# Patient Record
Sex: Male | Born: 1945 | Race: White | Hispanic: No | Marital: Married | Smoking: Never smoker
Health system: Southern US, Community
[De-identification: ages and names within clinical notes are randomized; demographics above are authoritative.]

## PROBLEM LIST (undated history)

## (undated) DIAGNOSIS — C61 Malignant neoplasm of prostate: Secondary | ICD-10-CM

## (undated) DIAGNOSIS — Z889 Allergy status to unspecified drugs, medicaments and biological substances status: Secondary | ICD-10-CM

## (undated) DIAGNOSIS — R0982 Postnasal drip: Secondary | ICD-10-CM

## (undated) DIAGNOSIS — R7989 Other specified abnormal findings of blood chemistry: Secondary | ICD-10-CM

## (undated) DIAGNOSIS — E785 Hyperlipidemia, unspecified: Secondary | ICD-10-CM

## (undated) DIAGNOSIS — C801 Malignant (primary) neoplasm, unspecified: Secondary | ICD-10-CM

## (undated) DIAGNOSIS — Z87898 Personal history of other specified conditions: Secondary | ICD-10-CM

## (undated) DIAGNOSIS — U071 COVID-19: Secondary | ICD-10-CM

## (undated) DIAGNOSIS — M1990 Unspecified osteoarthritis, unspecified site: Secondary | ICD-10-CM

## (undated) DIAGNOSIS — K219 Gastro-esophageal reflux disease without esophagitis: Secondary | ICD-10-CM

## (undated) DIAGNOSIS — I10 Essential (primary) hypertension: Secondary | ICD-10-CM

## (undated) DIAGNOSIS — I2510 Atherosclerotic heart disease of native coronary artery without angina pectoris: Secondary | ICD-10-CM

## (undated) DIAGNOSIS — I739 Peripheral vascular disease, unspecified: Secondary | ICD-10-CM

## (undated) DIAGNOSIS — M65332 Trigger finger, left middle finger: Secondary | ICD-10-CM

## (undated) DIAGNOSIS — M65341 Trigger finger, right ring finger: Secondary | ICD-10-CM

## (undated) DIAGNOSIS — M65331 Trigger finger, right middle finger: Secondary | ICD-10-CM

## (undated) DIAGNOSIS — N3941 Urge incontinence: Secondary | ICD-10-CM

## (undated) DIAGNOSIS — J189 Pneumonia, unspecified organism: Secondary | ICD-10-CM

## (undated) DIAGNOSIS — Z973 Presence of spectacles and contact lenses: Secondary | ICD-10-CM

## (undated) DIAGNOSIS — Z9189 Other specified personal risk factors, not elsewhere classified: Secondary | ICD-10-CM

## (undated) DIAGNOSIS — Z951 Presence of aortocoronary bypass graft: Secondary | ICD-10-CM

## (undated) HISTORY — PX: MOHS SURGERY: SUR867

## (undated) HISTORY — PX: TONSILLECTOMY: SUR1361

## (undated) HISTORY — DX: Hyperlipidemia, unspecified: E78.5

## (undated) HISTORY — PX: CATARACT EXTRACTION, BILATERAL: SHX1313

## (undated) HISTORY — PX: COLONOSCOPY: SHX174

## (undated) HISTORY — DX: Essential (primary) hypertension: I10

## (undated) HISTORY — PX: OTHER SURGICAL HISTORY: SHX169

## (undated) HISTORY — PX: SURGERY SCROTAL / TESTICULAR: SUR1316

## (undated) HISTORY — DX: Presence of aortocoronary bypass graft: Z95.1

## (undated) HISTORY — DX: Atherosclerotic heart disease of native coronary artery without angina pectoris: I25.10

## (undated) HISTORY — DX: Other specified abnormal findings of blood chemistry: R79.89

---

## 1998-05-04 ENCOUNTER — Other Ambulatory Visit: Admission: RE | Admit: 1998-05-04 | Discharge: 1998-05-04 | Payer: Self-pay | Admitting: Urology

## 1998-11-02 ENCOUNTER — Encounter: Admission: RE | Admit: 1998-11-02 | Discharge: 1998-11-02 | Payer: Self-pay | Admitting: Urology

## 1998-11-02 ENCOUNTER — Encounter: Payer: Self-pay | Admitting: Urology

## 1999-05-01 ENCOUNTER — Encounter: Payer: Self-pay | Admitting: Urology

## 1999-05-01 ENCOUNTER — Encounter: Admission: RE | Admit: 1999-05-01 | Discharge: 1999-05-01 | Payer: Self-pay | Admitting: Urology

## 2000-01-22 ENCOUNTER — Encounter: Payer: Self-pay | Admitting: Urology

## 2000-01-22 ENCOUNTER — Encounter: Admission: RE | Admit: 2000-01-22 | Discharge: 2000-01-22 | Payer: Self-pay | Admitting: Urology

## 2001-03-10 ENCOUNTER — Encounter: Admission: RE | Admit: 2001-03-10 | Discharge: 2001-03-10 | Payer: Self-pay | Admitting: Urology

## 2001-03-10 ENCOUNTER — Encounter: Payer: Self-pay | Admitting: Urology

## 2002-01-15 ENCOUNTER — Encounter: Admission: RE | Admit: 2002-01-15 | Discharge: 2002-01-15 | Payer: Self-pay | Admitting: Urology

## 2002-01-15 ENCOUNTER — Encounter: Payer: Self-pay | Admitting: Urology

## 2002-04-20 ENCOUNTER — Encounter: Payer: Self-pay | Admitting: Orthopedic Surgery

## 2002-04-20 ENCOUNTER — Ambulatory Visit (HOSPITAL_COMMUNITY): Admission: RE | Admit: 2002-04-20 | Discharge: 2002-04-20 | Payer: Self-pay | Admitting: Orthopedic Surgery

## 2002-07-02 ENCOUNTER — Ambulatory Visit (HOSPITAL_COMMUNITY): Admission: RE | Admit: 2002-07-02 | Discharge: 2002-07-02 | Payer: Self-pay | Admitting: Orthopedic Surgery

## 2003-04-19 ENCOUNTER — Emergency Department (HOSPITAL_COMMUNITY): Admission: EM | Admit: 2003-04-19 | Discharge: 2003-04-19 | Payer: Self-pay | Admitting: Emergency Medicine

## 2003-10-18 ENCOUNTER — Ambulatory Visit (HOSPITAL_COMMUNITY): Admission: RE | Admit: 2003-10-18 | Discharge: 2003-10-18 | Payer: Self-pay | Admitting: Cardiology

## 2007-08-27 ENCOUNTER — Ambulatory Visit (HOSPITAL_BASED_OUTPATIENT_CLINIC_OR_DEPARTMENT_OTHER): Admission: RE | Admit: 2007-08-27 | Discharge: 2007-08-27 | Payer: Self-pay | Admitting: Urology

## 2007-08-27 ENCOUNTER — Encounter (INDEPENDENT_AMBULATORY_CARE_PROVIDER_SITE_OTHER): Payer: Self-pay | Admitting: Urology

## 2010-05-23 NOTE — Op Note (Signed)
NAMEADAN, BAEHR                 ACCOUNT NO.:  [id number]   MEDICAL RECORD NO.:  [id number]          PATIENT TYPE:  AMB   LOCATION:  [facility]                         FACILITY:  [facility]   PHYSICIAN:  Courtney Paris, M.D.DATE OF BIRTH:  1946-01-06   DATE OF PROCEDURE:  DATE OF DISCHARGE:                               OPERATIVE REPORT   Dictated by Morton Stall for Courtney Paris, M.D.   PREOPERATIVE DIAGNOSIS:  Right epididymal mass.   POSTOPERATIVE DIAGNOSIS:  Right epididymal mass.   PROCEDURE:  Right partial epididymectomy.   ATTENDING PHYSICIAN:  Courtney Paris, M.D.   RESIDENT PHYSICIAN:  Dr. Nira Conn.   ANESTHESIA:  General.   INDICATIONS FOR PROCEDURE:  Mr. Minney is a 65 year old white male patient  of Dr. Valda Lamb.  He was evaluated in the clinic for nonspecific  complaints last fall and had a scrotal ultrasound which was negative.  He returned recently and Dr. Aldean Ast evaluated him and found a right  epididymal mass that was firm and tender.  It was evaluated by a scrotal  ultrasound again which showed a new mass in the tail of the epididymis  close to the testicle.  It did have flow and it was solid, but could not  be further characterized by ultrasound.  Subsequently Dr. Aldean Ast  recommended scrotal exploration with excision of the mass including  possible epididymectomy and orchiectomy.  Informed consent was obtained  after discussion of risks, benefits, complications and concerns was  undertaken.   PROCEDURE IN DETAIL:  The patient was brought to the operating room and  placed in the supine position.  He was correctly identified by his  wristband and appropriate time-out was taken.  IV antibiotics were  administered.  General anesthesia was delivered.  Once adequately  anesthetized, his right scrotum was clipped, prepped and draped in  normal sterile fashion.  We had discussed an inguinal approach, but  after examining him before he was  prepped and draped we felt a scrotal  approach to be more appropriate.  We subsequently sharply incised the  median raphe of the scrotum and then carried down our dissection using  Bovie electrocautery until we reached the right tunica vaginalis which  was incised sharply.  He had a small hydrocele on the right which  drained clear yellow fluid.  We then delivered the testicle into the  operative field.  Closely inspected the testicle.  On inspection it  appeared normal.  However, its consistency was somewhat boggy.  It was  of normal color and appeared to have good flow.  The spermatic cord  structures were all palpably normal.  The mass in question was located  in the tail of the epididymis.  It was densely adherent to the lower  pole of the testicle.  It was irregular in contour and solid.  It seemed  only to involve the lower pole of the epididymis and likewise we  proceeded forth with partial epididymectomy.  We used a combination of  blunt dissection electrocautery and sharp dissection to develop the  plane between the epididymis and the  testicle.  We carried our  dissection circumferentially around the epididymis freeing it from the  most inferior anterior aspect proceeding cephalad and posterior manner.  Once we had completely freed up the mass from the testicle, we  identified the vas deferens.  We placed two hemostats on it, divided  with Bovie electrocautery and placed a suture ligature using 3-0 chromic  on the distal end although this patient has already had a vasectomy.  We  then found the plane in the epididymis that was cephalad to the mass and  subsequently divided this also with Bovie electrocautery freeing our  specimen which was subsequently passed off the table.  We used Bovie  electrocautery to achieve hemostasis of the epididymis.  We did not  carry our dissection cephalad enough to get into the hilum of the  epididymis.  We then reapproximated the epididymis with  the lower pole  of the testicle by swinging it in a caudal fashion and closed the  surrounding defect of the tunica with multiple simple interrupted  sutures using 3-0 chromic.  We reinspected the testicle.  It was  uninjured.  Our resection bed was clean, dry and hemostatic.  There were  no open windows in the spermatic cord and likewise we turned our  attention to our closure.  We closed in two layers.  We reapproximated  the dartos layer with a running suture using 3-0 chromic and then the  skin with also a running suture using 3-0 chromic.  Local anesthesia was  placed prior to skin closure using 0.25% plain Marcaine.  After the skin  was sutured closed it was dried and collodion was placed on top of this.  This marked the end of our procedure.  He tolerated the procedure well.  There were no complications.  He awoke from anesthesia and was taken to  the recovery room in stable condition.  Dr. Aldean Ast was present and  participated in all aspects of the case.      Courtney Paris, M.D.  Electronically Signed     HMK/MEDQ  D:  08/27/2007  T:  08/27/2007  Job:  [id number]

## 2010-05-26 NOTE — Op Note (Signed)
NAME:  STANDLEY, BARGO                           ACCOUNT NO.:  [id number]   MEDICAL RECORD NO.:  [id number]                   PATIENT TYPE:  AMB   LOCATION:  DAY                                  FACILITY:  [facility]   PHYSICIAN:  Ronald A. Darrelyn Hillock, M.D.             DATE OF BIRTH:  1945/11/25   DATE OF PROCEDURE:  07/02/2002  DATE OF DISCHARGE:                                 OPERATIVE REPORT   SURGEON:  Georges Lynch. Darrelyn Hillock, M.D.   ASSISTANT:  Nurse.   PREOPERATIVE DIAGNOSES:  1. 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  2. Tear of the posterior horn of the medial meniscus, right knee.   POSTOPERATIVE DIAGNOSES:  1. 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  2. Tear of the posterior horn of the medial meniscus, right knee.   OPERATION:  1. Diagnostic arthroscopy, right knee.  2. Medial meniscectomy, right knee.  3. Abrasion chondroplasty of the medial femoral condyle, right knee.   SURGEON:  Georges Lynch. Gioffre, M.D.   DESCRIPTION OF PROCEDURE:  Under general anesthesia, the patient first had 1  g of IV Ancef.  Then a routine orthopedic prep draping of the right knee was  carried out.  A small punctate incision was made in the suprapatellar pouch;  inflow cannula was inserted, and knee was distended with saline.  Another  small punctate incision was made in the anterolateral joint.  At this time,  the arthroscope was entered, and a complete diagnostic arthroscopy was  carried out.  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  The  shaver sucker device was inserted within the medial approach, and I did an  abrasion chondroplasty of the patella.  Following this, I then went on and  arthroscoped the lateral joint space which was clean.  In the medial joint  space, he had rather significant chondromalacia of the medial femoral  condyle and also a significant tear of the posterior horn of the  medial  meniscus.  I introduced a shaver sucker device, did a partial medial  meniscectomy of the posterior horn of the medial meniscus, and I went on and  did an abrasion chondroplasty of the medial femoral condyle.  Thoroughly  irrigated out the knee, removed all of the fluid, closed all three punctate  incisions with 3-0 nylon suture.  I injected 20 mL of 0.5% Marcaine with  epinephrine into the knee joint and then applied a sterile Neosporin bundle  dressing.  The patient left the operating room in satisfactory condition.                                               Ronald A.  Darrelyn Hillock, M.D.    RAG/MEDQ  D:  07/02/2002  T:  07/02/2002  Job:  [id number]

## 2010-07-22 DIAGNOSIS — I2510 Atherosclerotic heart disease of native coronary artery without angina pectoris: Secondary | ICD-10-CM | POA: Insufficient documentation

## 2010-07-27 HISTORY — PX: TRANSTHORACIC ECHOCARDIOGRAM: SHX275

## 2010-08-09 DIAGNOSIS — I2510 Atherosclerotic heart disease of native coronary artery without angina pectoris: Secondary | ICD-10-CM

## 2010-08-09 DIAGNOSIS — Z951 Presence of aortocoronary bypass graft: Secondary | ICD-10-CM | POA: Insufficient documentation

## 2010-08-09 HISTORY — DX: Presence of aortocoronary bypass graft: Z95.1

## 2010-08-09 HISTORY — DX: Atherosclerotic heart disease of native coronary artery without angina pectoris: I25.10

## 2010-08-18 ENCOUNTER — Ambulatory Visit (HOSPITAL_COMMUNITY)
Admission: RE | Admit: 2010-08-18 | Discharge: 2010-08-18 | Disposition: A | Discharge status: Home / Self Care | Payer: Medicare Other | Source: Ambulatory Visit | Attending: Cardiology | Admitting: Cardiology

## 2010-08-18 DIAGNOSIS — I2510 Atherosclerotic heart disease of native coronary artery without angina pectoris: Secondary | ICD-10-CM | POA: Insufficient documentation

## 2010-08-18 DIAGNOSIS — R9431 Abnormal electrocardiogram [ECG] [EKG]: Secondary | ICD-10-CM | POA: Insufficient documentation

## 2010-08-18 DIAGNOSIS — R9439 Abnormal result of other cardiovascular function study: Secondary | ICD-10-CM | POA: Insufficient documentation

## 2010-08-18 HISTORY — PX: CARDIAC CATHETERIZATION: SHX172

## 2010-08-21 ENCOUNTER — Encounter (INDEPENDENT_AMBULATORY_CARE_PROVIDER_SITE_OTHER): Payer: Medicare Other | Admitting: Cardiothoracic Surgery

## 2010-08-21 DIAGNOSIS — I2510 Atherosclerotic heart disease of native coronary artery without angina pectoris: Secondary | ICD-10-CM

## 2010-08-25 ENCOUNTER — Encounter (HOSPITAL_COMMUNITY)
Admission: RE | Admit: 2010-08-25 | Discharge: 2010-08-25 | Disposition: A | Discharge status: Home / Self Care | Payer: Medicare Other | Source: Ambulatory Visit | Attending: Cardiothoracic Surgery | Admitting: Cardiothoracic Surgery

## 2010-08-25 ENCOUNTER — Ambulatory Visit (HOSPITAL_COMMUNITY)
Admission: RE | Admit: 2010-08-25 | Discharge: 2010-08-25 | Disposition: A | Discharge status: Home / Self Care | Payer: Medicare Other | Source: Ambulatory Visit | Attending: Cardiothoracic Surgery | Admitting: Cardiothoracic Surgery

## 2010-08-25 ENCOUNTER — Other Ambulatory Visit: Payer: Self-pay | Admitting: Cardiothoracic Surgery

## 2010-08-25 DIAGNOSIS — Z01810 Encounter for preprocedural cardiovascular examination: Secondary | ICD-10-CM | POA: Insufficient documentation

## 2010-08-25 DIAGNOSIS — I2510 Atherosclerotic heart disease of native coronary artery without angina pectoris: Secondary | ICD-10-CM

## 2010-08-25 DIAGNOSIS — I6529 Occlusion and stenosis of unspecified carotid artery: Secondary | ICD-10-CM | POA: Insufficient documentation

## 2010-08-25 DIAGNOSIS — Z01812 Encounter for preprocedural laboratory examination: Secondary | ICD-10-CM | POA: Insufficient documentation

## 2010-08-25 DIAGNOSIS — Z01818 Encounter for other preprocedural examination: Secondary | ICD-10-CM | POA: Insufficient documentation

## 2010-08-25 DIAGNOSIS — I10 Essential (primary) hypertension: Secondary | ICD-10-CM | POA: Insufficient documentation

## 2010-08-25 DIAGNOSIS — R9431 Abnormal electrocardiogram [ECG] [EKG]: Secondary | ICD-10-CM | POA: Insufficient documentation

## 2010-08-25 DIAGNOSIS — R9439 Abnormal result of other cardiovascular function study: Secondary | ICD-10-CM | POA: Insufficient documentation

## 2010-08-25 DIAGNOSIS — J3489 Other specified disorders of nose and nasal sinuses: Secondary | ICD-10-CM | POA: Insufficient documentation

## 2010-08-25 HISTORY — PX: OTHER SURGICAL HISTORY: SHX169

## 2010-08-25 LAB — COMPREHENSIVE METABOLIC PANEL
ALT: 22 U/L (ref 0–53)
AST: 18 U/L (ref 0–37)
Albumin: 4.1 g/dL (ref 3.5–5.2)
Alkaline Phosphatase: 63 U/L (ref 39–117)
BUN: 14 mg/dL (ref 6–23)
CO2: 25 mEq/L (ref 19–32)
Calcium: 9.8 mg/dL (ref 8.4–10.5)
Chloride: 101 mEq/L (ref 96–112)
Creatinine, Ser: 1.32 mg/dL (ref 0.50–1.35)
GFR calc Af Amer: >60 mL/min (ref >60)
GFR calc non Af Amer: 54 mL/min — ABNORMAL LOW (ref >60)
Glucose, Bld: 95 mg/dL (ref 70–99)
Potassium: 4.4 mEq/L (ref 3.5–5.1)
Sodium: 137 mEq/L (ref 135–145)
Total Bilirubin: 0.3 mg/dL (ref 0.3–1.2)
Total Protein: 6.6 g/dL (ref 6.0–8.3)

## 2010-08-25 LAB — URINALYSIS, ROUTINE W REFLEX MICROSCOPIC
Bilirubin Urine: NEGATIVE
Glucose, UA: NEGATIVE mg/dL
Ketones, ur: NEGATIVE mg/dL
Leukocytes, UA: NEGATIVE
Nitrite: NEGATIVE
Protein, ur: NEGATIVE mg/dL
Specific Gravity, Urine: 1.007 (ref 1.005–1.030)
Urobilinogen, UA: 0.2 mg/dL (ref 0.0–1.0)
pH: 7.5 (ref 5.0–8.0)

## 2010-08-25 LAB — CBC
HCT: 46.3 % (ref 39.0–52.0)
Hemoglobin: 16.7 g/dL (ref 13.0–17.0)
MCH: 30.8 pg (ref 26.0–34.0)
MCHC: 36.1 g/dL — ABNORMAL HIGH (ref 30.0–36.0)
MCV: 85.3 fL (ref 78.0–100.0)
Platelets: 214 10*3/uL (ref 150–400)
RBC: 5.43 MIL/uL (ref 4.22–5.81)
RDW: 12.8 % (ref 11.5–15.5)
WBC: 7.5 10*3/uL (ref 4.0–10.5)

## 2010-08-25 LAB — SURGICAL PCR SCREEN
MRSA, PCR: NEGATIVE
Staphylococcus aureus: NEGATIVE

## 2010-08-25 LAB — BLOOD GAS, ARTERIAL
Acid-Base Excess: 4 mmol/L — ABNORMAL HIGH (ref 0.0–2.0)
Bicarbonate: 27.2 mEq/L — ABNORMAL HIGH (ref 20.0–24.0)
Drawn by: 344381
FIO2: 0.21 %
O2 Saturation: 98.8 %
Patient temperature: 98.6
TCO2: 28.3 mmol/L (ref 0–100)
pCO2 arterial: 35.2 mmHg (ref 35.0–45.0)
pH, Arterial: 7.5 — ABNORMAL HIGH (ref 7.350–7.450)
pO2, Arterial: 110 mmHg — ABNORMAL HIGH (ref 80.0–100.0)

## 2010-08-25 LAB — URINE MICROSCOPIC-ADD ON

## 2010-08-25 LAB — PROTIME-INR
INR: 0.92 (ref 0.00–1.49)
Prothrombin Time: 12.6 seconds (ref 11.6–15.2)

## 2010-08-25 LAB — APTT: aPTT: 30 seconds (ref 24–37)

## 2010-08-25 LAB — HEMOGLOBIN A1C
Hgb A1c MFr Bld: 5.8 % — ABNORMAL HIGH (ref <5.7)
Mean Plasma Glucose: 120 mg/dL — ABNORMAL HIGH (ref <117)

## 2010-08-25 LAB — TYPE AND SCREEN
ABO/RH(D): O POS
Antibody Screen: NEGATIVE

## 2010-08-25 LAB — ABO/RH: ABO/RH(D): O POS

## 2010-08-28 NOTE — H&P (Signed)
NAMEHAYVEN, [name] NO.:  [id number]  MEDICAL RECORD NO.:  [id number]  LOCATION:                                 FACILITY:  PHYSICIAN:  Sheliah Plane, MD    DATE OF BIRTH:  [birth year]  DATE OF ADMISSION: DATE OF DISCHARGE:                             HISTORY & PHYSICAL   REQUESTING PHYSICIAN:  Thereasa Solo. Little, MD, followed at [facility] by Dr. Herbie Baltimore.  PRIMARY CARE PHYSICIAN:  Soyla Murphy. Renne Crigler, MD  REASON FOR CONSULTATION:  Question of suitability for coronary artery bypass grafting.  HISTORY OF PRESENT ILLNESS:  The patient is a 65 year old male who is asymptomatic.  He was noted to have some subtle EKG changes on his annual physical and was referred in mid July for stress test at [place].  Stress test was performed on July 27, 2010.  The patient did have some shortness of breath and generalized fatigue.  He exercised up to 11 METS.  There was 0.5 mm ST depression inferiorly, electrically positive for ischemia.  There was normal myocardial perfusion study. With the test being positive, the patient then underwent cardiac catheterization on Friday and was referred to the office for consideration for surgery.  He had no previous history of heart failure. He has no previous history of myocardial infarction.  He remains very active and denies chest pain.  He does have some epigastric bloating on occasion, but not exercise related.  He also has shoulder pain, but he notes that is at night at rest after has been playing golf.  PAST MEDICAL HISTORY:  He denies diabetes.  He is a nonsmoker.  He did chew tobacco for 30 years, but quit 3 years ago.  He has a history of hypertension and history of hypercholesterolemia, though he has been unable to his tolerate statins, specifically Lipitor in the past. History of microscopic hematuria, evaluated 2 months ago by Dr. Aldean Ast, history of left eye hemorrhage with visual change  several years ago, though he notes this is markedly improved.  SOCIAL HISTORY:  The patient is married, retired Sports coach. Denies any alcohol use.  FAMILY HISTORY:  The patient's grandfather died at age 23 of heart failure.  Father died of renal carcinoma at age 72.  Mother is alive at age 71.  He had one brother who has a questionable history of heart disease.  The details are not known.  MEDICATIONS:  P.r.n. Indocin 25 mg a day, Micardis/hydrochlorothiazide 40/12.5.  He has just been started on metoprolol 25 once a day, testosterone 1.5 mL injection once a month, doxycycline 100 mg a day, Zyrtec 10 mg p.r.n., pantoprazole 40 mg a day, Levitra 10 mg, fish oil 1200 mg, Caltrate and vitamin D 600 mg a day, Tylenol 650 mg a day, lorazepam 0.5 mg p.r.n., Finacea gel 15%, B12 25 mcg once a day.  ALLERGIES: 1. SULFA causes rash. 2. CEPHALOSPORINS cause weakness, nervousness, and increased heart     rate. 3. CHOLESTEROL MEDICATIONS cause elevated muscle enzymes. 4. MAXZIDE causes increased heart rate. 5. LISINOPRIL increases cough.  REVIEW OF SYSTEMS:  CARDIAC:  Negative for exertional shortness of breath, resting  shortness of breath, orthopnea, syncope, presyncope, palpitations, or lower extremity edema.  He denies chest pain.  GENERAL: Denies fever, chills, or night sweats.  NEUROLOGICAL:  No history of TIAs.  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  PULMONARY: Denies wheezing.  Denies hemoptysis.  GI:  He has had a colonoscopy in 2007.  GU:  He has a history of microscopic hematuria, followed by Dr. Aldean Ast.  IMMUNIZATION:  He is up to date on pneumococcal and flu vaccination.  Other review of systems are negative.  PHYSICAL EXAMINATION:  VITAL SIGNS:  His blood pressure 137/78, pulse 68, respiratory rate 18, O2 sats 98%.  He is 5 feet and 10 inches tall, 172 pounds. GENERAL:  The patient is awake, alert, neurologically intact. NECK:  I do not appreciate any  carotid bruits. LUNGS:  Clear bilaterally. CARDIAC:  Regular rate and rhythm without murmur or gallop. ABDOMEN:  Benign without palpable masses. EXTREMITIES:  Appears to have adequate vein in both lower extremities. 2+ DP and PT pulses bilaterally.  The patient's echo done at [facility] was reviewed as is his stress test.  Cardiac catheterization performed on August 18, 2010, shows 70% left main, 70%-80% proximal and mid LAD lesions.  The right coronary artery is patent.  There is diffuse disease in the PDA and a small acute marginal is diseased  IMPRESSION:  The patient with hemodynamically significant left main, left anterior descending disease and distal right disease.  I have reviewed with the patient and his wife the detail the risks and options. I agree with Dr. Clarene Duke that coronary artery bypass grafting offers the best relief of symptoms and preservation of myocardial function.  The risks of surgery including death, infection, stroke, myocardial infarction, bleeding, and blood transfusion all discussed with the patient in detail and he is willing to proceed.  I have allowed him to continue to take aspirin 81 mg a day prior to surgery.  He will not take any more Indocin.  We will also ask him to stop taking fish oil.  Later this week, he will have his preoperative some studies done and we will proceed on with coronary artery bypass grafting on August 29, 2010.     Sheliah Plane, MD     EG/MEDQ  D:  08/21/2010  T:  08/22/2010  Job:  [id number]  cc:   Landry Corporal, MD Soyla Murphy. Renne Crigler, M.D.  Electronically Signed by Sheliah Plane MD on 08/28/2010 02:22:14 PM

## 2010-08-29 ENCOUNTER — Inpatient Hospital Stay (HOSPITAL_COMMUNITY): Payer: Medicare Other

## 2010-08-29 ENCOUNTER — Inpatient Hospital Stay (HOSPITAL_COMMUNITY)
Admission: RE | Admit: 2010-08-29 | Discharge: 2010-09-02 | DRG: 236 | Disposition: A | Discharge status: Home / Self Care | Payer: Medicare Other | Source: Ambulatory Visit | Attending: Cardiothoracic Surgery | Admitting: Cardiothoracic Surgery

## 2010-08-29 DIAGNOSIS — Z882 Allergy status to sulfonamides status: Secondary | ICD-10-CM

## 2010-08-29 DIAGNOSIS — Z01812 Encounter for preprocedural laboratory examination: Secondary | ICD-10-CM

## 2010-08-29 DIAGNOSIS — E7800 Pure hypercholesterolemia, unspecified: Secondary | ICD-10-CM | POA: Diagnosis present

## 2010-08-29 DIAGNOSIS — I10 Essential (primary) hypertension: Secondary | ICD-10-CM | POA: Diagnosis present

## 2010-08-29 DIAGNOSIS — E876 Hypokalemia: Secondary | ICD-10-CM | POA: Diagnosis not present

## 2010-08-29 DIAGNOSIS — D62 Acute posthemorrhagic anemia: Secondary | ICD-10-CM | POA: Diagnosis not present

## 2010-08-29 DIAGNOSIS — Z87891 Personal history of nicotine dependence: Secondary | ICD-10-CM

## 2010-08-29 DIAGNOSIS — I2510 Atherosclerotic heart disease of native coronary artery without angina pectoris: Principal | ICD-10-CM | POA: Diagnosis present

## 2010-08-29 DIAGNOSIS — Z79899 Other long term (current) drug therapy: Secondary | ICD-10-CM

## 2010-08-29 DIAGNOSIS — K219 Gastro-esophageal reflux disease without esophagitis: Secondary | ICD-10-CM | POA: Diagnosis present

## 2010-08-29 DIAGNOSIS — Z7982 Long term (current) use of aspirin: Secondary | ICD-10-CM

## 2010-08-29 HISTORY — PX: CORONARY ARTERY BYPASS GRAFT: SHX141

## 2010-08-29 LAB — POCT I-STAT 3, ART BLOOD GAS (G3+)
Acid-Base Excess: 2 mmol/L (ref 0.0–2.0)
Acid-Base Excess: 2 mmol/L (ref 0.0–2.0)
Bicarbonate: 26.7 mEq/L — ABNORMAL HIGH (ref 20.0–24.0)
Bicarbonate: 24.7 mEq/L — ABNORMAL HIGH (ref 20.0–24.0)
Bicarbonate: 24.9 mEq/L — ABNORMAL HIGH (ref 20.0–24.0)
O2 Saturation: 100 %
O2 Saturation: 94 %
O2 Saturation: 99 %
Patient temperature: 35.2
Patient temperature: 36.7
TCO2: 28 mmol/L (ref 0–100)
TCO2: 26 mmol/L (ref 0–100)
TCO2: 26 mmol/L (ref 0–100)
pCO2 arterial: 40.3 mmHg (ref 35.0–45.0)
pCO2 arterial: 36.3 mmHg (ref 35.0–45.0)
pCO2 arterial: 32.3 mmHg — ABNORMAL LOW (ref 35.0–45.0)
pH, Arterial: 7.429 (ref 7.350–7.450)
pH, Arterial: 7.434 (ref 7.350–7.450)
pH, Arterial: 7.493 — ABNORMAL HIGH (ref 7.350–7.450)
pO2, Arterial: 452 mmHg — ABNORMAL HIGH (ref 80.0–100.0)
pO2, Arterial: 62 mmHg — ABNORMAL LOW (ref 80.0–100.0)
pO2, Arterial: 112 mmHg — ABNORMAL HIGH (ref 80.0–100.0)

## 2010-08-29 LAB — CBC
HCT: 34.7 % — ABNORMAL LOW (ref 39.0–52.0)
HCT: 33.2 % — ABNORMAL LOW (ref 39.0–52.0)
Hemoglobin: 12.3 g/dL — ABNORMAL LOW (ref 13.0–17.0)
Hemoglobin: 11.7 g/dL — ABNORMAL LOW (ref 13.0–17.0)
MCH: 30.2 pg (ref 26.0–34.0)
MCH: 30.2 pg (ref 26.0–34.0)
MCHC: 35.4 g/dL (ref 30.0–36.0)
MCHC: 35.2 g/dL (ref 30.0–36.0)
MCV: 85.3 fL (ref 78.0–100.0)
MCV: 85.6 fL (ref 78.0–100.0)
Platelets: 104 10*3/uL — ABNORMAL LOW (ref 150–400)
Platelets: 124 10*3/uL — ABNORMAL LOW (ref 150–400)
RBC: 4.07 MIL/uL — ABNORMAL LOW (ref 4.22–5.81)
RBC: 3.88 MIL/uL — ABNORMAL LOW (ref 4.22–5.81)
RDW: 12.8 % (ref 11.5–15.5)
RDW: 12.9 % (ref 11.5–15.5)
WBC: 9.1 10*3/uL (ref 4.0–10.5)
WBC: 12.4 10*3/uL — ABNORMAL HIGH (ref 4.0–10.5)

## 2010-08-29 LAB — POCT I-STAT 4, (NA,K, GLUC, HGB,HCT)
Glucose, Bld: 89 mg/dL (ref 70–99)
Glucose, Bld: 103 mg/dL — ABNORMAL HIGH (ref 70–99)
Glucose, Bld: 100 mg/dL — ABNORMAL HIGH (ref 70–99)
Glucose, Bld: 82 mg/dL (ref 70–99)
Glucose, Bld: 91 mg/dL (ref 70–99)
Glucose, Bld: 80 mg/dL (ref 70–99)
HCT: 42 % (ref 39.0–52.0)
HCT: 40 % (ref 39.0–52.0)
HCT: 32 % — ABNORMAL LOW (ref 39.0–52.0)
HCT: 33 % — ABNORMAL LOW (ref 39.0–52.0)
HCT: 30 % — ABNORMAL LOW (ref 39.0–52.0)
HCT: 36 % — ABNORMAL LOW (ref 39.0–52.0)
Hemoglobin: 14.3 g/dL (ref 13.0–17.0)
Hemoglobin: 13.6 g/dL (ref 13.0–17.0)
Hemoglobin: 10.9 g/dL — ABNORMAL LOW (ref 13.0–17.0)
Hemoglobin: 11.2 g/dL — ABNORMAL LOW (ref 13.0–17.0)
Hemoglobin: 10.2 g/dL — ABNORMAL LOW (ref 13.0–17.0)
Hemoglobin: 12.2 g/dL — ABNORMAL LOW (ref 13.0–17.0)
Potassium: 4.1 mEq/L (ref 3.5–5.1)
Potassium: 4.1 mEq/L (ref 3.5–5.1)
Potassium: 4 mEq/L (ref 3.5–5.1)
Potassium: 5.2 mEq/L — ABNORMAL HIGH (ref 3.5–5.1)
Potassium: 4.1 mEq/L (ref 3.5–5.1)
Potassium: 3.5 mEq/L (ref 3.5–5.1)
Sodium: 137 mEq/L (ref 135–145)
Sodium: 136 mEq/L (ref 135–145)
Sodium: 132 mEq/L — ABNORMAL LOW (ref 135–145)
Sodium: 138 mEq/L (ref 135–145)
Sodium: 135 mEq/L (ref 135–145)
Sodium: 140 mEq/L (ref 135–145)

## 2010-08-29 LAB — GLUCOSE, CAPILLARY
Glucose-Capillary: 90 mg/dL (ref 70–99)
Glucose-Capillary: 137 mg/dL — ABNORMAL HIGH (ref 70–99)
Glucose-Capillary: 112 mg/dL — ABNORMAL HIGH (ref 70–99)
Glucose-Capillary: 132 mg/dL — ABNORMAL HIGH (ref 70–99)
Glucose-Capillary: 128 mg/dL — ABNORMAL HIGH (ref 70–99)

## 2010-08-29 LAB — POCT I-STAT, CHEM 8
BUN: 11 mg/dL (ref 6–23)
Calcium, Ion: 1.15 mmol/L (ref 1.12–1.32)
Chloride: 105 mEq/L (ref 96–112)
Creatinine, Ser: 1.2 mg/dL (ref 0.50–1.35)
Glucose, Bld: 131 mg/dL — ABNORMAL HIGH (ref 70–99)
HCT: 33 % — ABNORMAL LOW (ref 39.0–52.0)
Hemoglobin: 11.2 g/dL — ABNORMAL LOW (ref 13.0–17.0)
Potassium: 4.2 mEq/L (ref 3.5–5.1)
Sodium: 141 mEq/L (ref 135–145)
TCO2: 26 mmol/L (ref 0–100)

## 2010-08-29 LAB — CREATININE, SERUM
Creatinine, Ser: 1.15 mg/dL (ref 0.50–1.35)
GFR calc Af Amer: >60 mL/min (ref >60)
GFR calc non Af Amer: >60 mL/min (ref >60)

## 2010-08-29 LAB — HEMOGLOBIN AND HEMATOCRIT, BLOOD
HCT: 32.8 % — ABNORMAL LOW (ref 39.0–52.0)
Hemoglobin: 12 g/dL — ABNORMAL LOW (ref 13.0–17.0)

## 2010-08-29 LAB — PROTIME-INR
INR: 1.53 — ABNORMAL HIGH (ref 0.00–1.49)
Prothrombin Time: 18.7 seconds — ABNORMAL HIGH (ref 11.6–15.2)

## 2010-08-29 LAB — APTT: aPTT: 34 seconds (ref 24–37)

## 2010-08-29 LAB — MAGNESIUM: Magnesium: 2.8 mg/dL — ABNORMAL HIGH (ref 1.5–2.5)

## 2010-08-29 LAB — PLATELET COUNT: Platelets: 168 10*3/uL (ref 150–400)

## 2010-08-30 ENCOUNTER — Inpatient Hospital Stay (HOSPITAL_COMMUNITY): Payer: Medicare Other

## 2010-08-30 LAB — POCT I-STAT, CHEM 8
BUN: 19 mg/dL (ref 6–23)
Calcium, Ion: 1.13 mmol/L (ref 1.12–1.32)
Chloride: 102 mEq/L (ref 96–112)
Creatinine, Ser: 1.4 mg/dL — ABNORMAL HIGH (ref 0.50–1.35)
Glucose, Bld: 114 mg/dL — ABNORMAL HIGH (ref 70–99)
HCT: 35 % — ABNORMAL LOW (ref 39.0–52.0)
Hemoglobin: 11.9 g/dL — ABNORMAL LOW (ref 13.0–17.0)
Potassium: 3.9 mEq/L (ref 3.5–5.1)
Sodium: 140 mEq/L (ref 135–145)
TCO2: 26 mmol/L (ref 0–100)

## 2010-08-30 LAB — MAGNESIUM
Magnesium: 2.3 mg/dL (ref 1.5–2.5)
Magnesium: 2.3 mg/dL (ref 1.5–2.5)

## 2010-08-30 LAB — GLUCOSE, CAPILLARY
Glucose-Capillary: 100 mg/dL — ABNORMAL HIGH (ref 70–99)
Glucose-Capillary: 134 mg/dL — ABNORMAL HIGH (ref 70–99)
Glucose-Capillary: 119 mg/dL — ABNORMAL HIGH (ref 70–99)
Glucose-Capillary: 121 mg/dL — ABNORMAL HIGH (ref 70–99)
Glucose-Capillary: 130 mg/dL — ABNORMAL HIGH (ref 70–99)

## 2010-08-30 LAB — CBC
HCT: 31.2 % — ABNORMAL LOW (ref 39.0–52.0)
HCT: 33.7 % — ABNORMAL LOW (ref 39.0–52.0)
Hemoglobin: 10.9 g/dL — ABNORMAL LOW (ref 13.0–17.0)
Hemoglobin: 11.7 g/dL — ABNORMAL LOW (ref 13.0–17.0)
MCH: 30.3 pg (ref 26.0–34.0)
MCH: 30.5 pg (ref 26.0–34.0)
MCHC: 34.9 g/dL (ref 30.0–36.0)
MCHC: 34.7 g/dL (ref 30.0–36.0)
MCV: 86.7 fL (ref 78.0–100.0)
MCV: 88 fL (ref 78.0–100.0)
Platelets: 125 10*3/uL — ABNORMAL LOW (ref 150–400)
Platelets: 127 10*3/uL — ABNORMAL LOW (ref 150–400)
RBC: 3.6 MIL/uL — ABNORMAL LOW (ref 4.22–5.81)
RBC: 3.83 MIL/uL — ABNORMAL LOW (ref 4.22–5.81)
RDW: 13.1 % (ref 11.5–15.5)
RDW: 13.6 % (ref 11.5–15.5)
WBC: 12.2 10*3/uL — ABNORMAL HIGH (ref 4.0–10.5)
WBC: 14.9 10*3/uL — ABNORMAL HIGH (ref 4.0–10.5)

## 2010-08-30 LAB — BASIC METABOLIC PANEL
BUN: 15 mg/dL (ref 6–23)
CO2: 27 mEq/L (ref 19–32)
Calcium: 8.1 mg/dL — ABNORMAL LOW (ref 8.4–10.5)
Chloride: 106 mEq/L (ref 96–112)
Creatinine, Ser: 1.27 mg/dL (ref 0.50–1.35)
GFR calc Af Amer: >60 mL/min (ref >60)
GFR calc non Af Amer: 57 mL/min — ABNORMAL LOW (ref >60)
Glucose, Bld: 127 mg/dL — ABNORMAL HIGH (ref 70–99)
Potassium: 4 mEq/L (ref 3.5–5.1)
Sodium: 139 mEq/L (ref 135–145)

## 2010-08-30 LAB — CREATININE, SERUM
Creatinine, Ser: 1.16 mg/dL (ref 0.50–1.35)
GFR calc Af Amer: >60 mL/min (ref >60)
GFR calc non Af Amer: >60 mL/min (ref >60)

## 2010-08-31 ENCOUNTER — Inpatient Hospital Stay (HOSPITAL_COMMUNITY): Payer: Medicare Other

## 2010-08-31 LAB — CBC
HCT: 33.1 % — ABNORMAL LOW (ref 39.0–52.0)
Hemoglobin: 11.3 g/dL — ABNORMAL LOW (ref 13.0–17.0)
MCH: 30.1 pg (ref 26.0–34.0)
MCHC: 34.1 g/dL (ref 30.0–36.0)
MCV: 88 fL (ref 78.0–100.0)
Platelets: 125 10*3/uL — ABNORMAL LOW (ref 150–400)
RBC: 3.76 MIL/uL — ABNORMAL LOW (ref 4.22–5.81)
RDW: 13.5 % (ref 11.5–15.5)
WBC: 12.1 10*3/uL — ABNORMAL HIGH (ref 4.0–10.5)

## 2010-08-31 LAB — GLUCOSE, CAPILLARY
Glucose-Capillary: 106 mg/dL — ABNORMAL HIGH (ref 70–99)
Glucose-Capillary: 109 mg/dL — ABNORMAL HIGH (ref 70–99)
Glucose-Capillary: 120 mg/dL — ABNORMAL HIGH (ref 70–99)
Glucose-Capillary: 179 mg/dL — ABNORMAL HIGH (ref 70–99)
Glucose-Capillary: 93 mg/dL (ref 70–99)

## 2010-08-31 LAB — BASIC METABOLIC PANEL
BUN: 21 mg/dL (ref 6–23)
CO2: 30 mEq/L (ref 19–32)
Calcium: 8.4 mg/dL (ref 8.4–10.5)
Chloride: 102 mEq/L (ref 96–112)
Creatinine, Ser: 1.09 mg/dL (ref 0.50–1.35)
GFR calc Af Amer: >60 mL/min (ref >60)
GFR calc non Af Amer: >60 mL/min (ref >60)
Glucose, Bld: 115 mg/dL — ABNORMAL HIGH (ref 70–99)
Potassium: 4 mEq/L (ref 3.5–5.1)
Sodium: 138 mEq/L (ref 135–145)

## 2010-09-01 ENCOUNTER — Inpatient Hospital Stay (HOSPITAL_COMMUNITY): Payer: Medicare Other

## 2010-09-01 LAB — CBC
HCT: 35.9 % — ABNORMAL LOW (ref 39.0–52.0)
Hemoglobin: 12.2 g/dL — ABNORMAL LOW (ref 13.0–17.0)
MCH: 30 pg (ref 26.0–34.0)
MCHC: 34 g/dL (ref 30.0–36.0)
MCV: 88.2 fL (ref 78.0–100.0)
Platelets: 157 10*3/uL (ref 150–400)
RBC: 4.07 MIL/uL — ABNORMAL LOW (ref 4.22–5.81)
RDW: 13.7 % (ref 11.5–15.5)
WBC: 10.2 10*3/uL (ref 4.0–10.5)

## 2010-09-01 LAB — BASIC METABOLIC PANEL
BUN: 20 mg/dL (ref 6–23)
CO2: 31 mEq/L (ref 19–32)
Calcium: 8.7 mg/dL (ref 8.4–10.5)
Chloride: 100 mEq/L (ref 96–112)
Creatinine, Ser: 1.23 mg/dL (ref 0.50–1.35)
GFR calc Af Amer: >60 mL/min (ref >60)
GFR calc non Af Amer: 59 mL/min — ABNORMAL LOW (ref >60)
Glucose, Bld: 114 mg/dL — ABNORMAL HIGH (ref 70–99)
Potassium: 3.6 mEq/L (ref 3.5–5.1)
Sodium: 139 mEq/L (ref 135–145)

## 2010-09-01 LAB — GLUCOSE, CAPILLARY
Glucose-Capillary: 113 mg/dL — ABNORMAL HIGH (ref 70–99)
Glucose-Capillary: 115 mg/dL — ABNORMAL HIGH (ref 70–99)
Glucose-Capillary: 87 mg/dL (ref 70–99)
Glucose-Capillary: 99 mg/dL (ref 70–99)
Glucose-Capillary: 93 mg/dL (ref 70–99)

## 2010-09-01 NOTE — Op Note (Signed)
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  SURGICAL PROCEDURES:  Coronary artery bypass grafting x4 with left internal mammary to the left anterior descending coronary artery, reverse saphenous vein graft to the diagonal coronary artery, reverse saphenous vein graft to the first obtuse marginal coronary artery, and reverse saphenous vein graft to the posterior descending coronary artery with right leg thigh and calf Endovein harvesting.  SURGEON:  Sheliah Plane, MD  FIRST ASSISTANT:  Kerin Perna, MD  BRIEF HISTORY:  The patient is a 65 year old male who was seen by Dr. Julieanne Manson because of incidentally found EKG changes.  Cardiac catheterization was performed, which demonstrated 70% to 80% distal left main, 70% to 80% LAD disease, 90% first diagonal, and 80% to 90% posterior descending coronary disease with preserved LV function. Because of the patient's anatomy, coronary artery bypass grafting was recommended.  The patient agreed and signed informed consent.  DESCRIPTION OF PROCEDURE:  With Swan-Ganz and arterial lines and monitors in place, the patient underwent general endotracheal anesthesia without incidence.  The skin of the chest and legs were prepped with Betadine and draped in usual sterile manner.  Using the [employer] Endovein harvesting system, vein was harvested from the right thigh and calf that was of excellent quality and caliber.  Median sternotomy was  performed. Left internal mammary artery was dissected down as pedicle graft.  The distal artery was divided and had good free flow.  The pericardium was opened.  Overall ventricular function appeared preserved.  The patient was systemically heparinized.  Ascending aorta was cannulated.  The right atrium was cannulated.  Aortic root vent cardioplegia needle was introduced into the ascending aorta.  The patient was placed on cardiopulmonary bypass 2.4 L per minute per meter squared.  Sites of anastomosis were selected and dissected out to the epicardium.  The patient's body temperature was cooled to 32 degrees.  Aortic crossclamp was applied and 500 mL of cold blood cardioplegia was administered with diastolic arrest of the heart.  Myocardial septal temperatures were monitored throughout the crossclamp.  Attention was turned first to the obtuse marginal vessel, which was a large vessel 1.8 mm in size.  Using running 7-0 Prolene, distal anastomosis was performed.  Attention was then turned to the first diagonal vessel, which was opened, was smaller vessel, admitted a 1-mm probe.  Using a running 7-0 Prolene, distal anastomosis was performed.  Attention was then turned to a small posterior descending vessel, which admitted a 1-mm probe distally. Using a running 7-0 Prolene, distal anastomosis was performed. Attention was then turned to the left anterior descending coronary artery, which was opened after the takeoff of the second diagonal.  The vessel  was relatively small, but did admit a 1.5-mm probe proximally and a 1-mm probe distally.  Using running 8-0 Prolene, left internal mammary artery was anastomosed to the left anterior descending coronary artery. With release of Edwards bulldog on the mammary artery, there was rise in myocardial septal temperature.  Bulldog was placed back on the mammary artery.  With crossclamp still in place, three punch aortotomies were performed and each of the  3 vein grafts anastomosed to the ascending aorta.  Air was evacuated from the grafts and ascending aorta and the aortotomy was completed.  Aortic crossclamp was removed.  Total crossclamp time 76 minutes.  The patient spontaneously converted to a sinus rhythm.  Sites of anastomosis were inspected and free of bleeding. Atrial and ventricular pacing wires were applied.  Graft markers applied.  Left pleural tube and Blake mediastinal drain were left in place after the patient was weaned from cardiopulmonary bypass without difficulty with a total pump time of 99 minutes.  He remained hemodynamically stable.  Protamine sulfate was administered.  The pericardium was reapproximated.  Sternum was closed with #6 stainless steel wire.  Fascia was closed with interrupted 0-Vicryl and 3-0 Vicryl subcutaneous tissue, 4-0 subcuticular stitch in skin edges.  Dry dressings were applied.  Sponge and needle count was reported as correct at completion of procedure.  The patient tolerated the procedure without obvious complications and was transferred to the surgical intensive care unit for further postoperative care.  He did not require any blood bank blood products during the procedure.     Sheliah Plane, MD     EG/MEDQ  D:  08/30/2010  T:  08/30/2010  Job:  [id number]  cc:   Thereasa Solo. Little, M.D.  Electronically Signed by Sheliah Plane MD on 09/01/2010 07:41:40 AM

## 2010-09-02 LAB — GLUCOSE, CAPILLARY: Glucose-Capillary: 100 mg/dL — ABNORMAL HIGH (ref 70–99)

## 2010-09-02 LAB — BASIC METABOLIC PANEL
Calcium: 8.7 mg/dL (ref 8.4–10.5)
Creatinine, Ser: 1.16 mg/dL (ref 0.50–1.35)
GFR calc non Af Amer: >60 mL/min (ref >60)
Sodium: 138 mEq/L (ref 135–145)

## 2010-09-10 NOTE — Cardiovascular Report (Signed)
NAMEMCKAY, TEGTMEYER NO.:  [id number]  MEDICAL RECORD NO.:  [id number]  LOCATION:  [facility]                         FACILITY:  [facility]  PHYSICIAN:  Thereasa Solo. Little, M.D. DATE OF BIRTH:  1945/04/01  DATE OF PROCEDURE:  08/18/2010 DATE OF DISCHARGE:  08/18/2010                           CARDIAC CATHETERIZATION   INDICATIONS FOR TEST:  This is 65 year old male who is asymptomatic.  He has a EKG showing some minor ST changes and because of this underwent a stress test.  The stress test was negative for ischemia, but with exercise, he had diffuse ST-segment depression consistent with global ischemia.  He was able to go 11 minutes on the treadmill.  Because of the abnormal EKG, he was brought in for a stress test.  PROCEDURE:  The patient was prepped and draped in the usual sterile fashion, exposing the right groin.  Following local anesthetic with 1% Xylocaine, the Seldinger technique was employed and a 6-French introducer sheath was placed in the right femoral artery.  Left and right coronary arteriography and ventriculography in the RAO projection was performed.  COMPLICATIONS:  None.  TOTAL CONTRAST:  70 mL.  EQUIPMENT:  5-French Judkins configuration catheters.  RESULTS: 1. Hemodynamic monitoring.  Central aortic pressure was 140/73.  The     left ventricular pressure was 141/5, and the left ventricular end-     diastolic pressure was 11.  There was no significant gradient noted     at the time of pullback across the aortic valve. 2. Coronary arteriography.  There was dense calcification on     fluoroscopy that well outlined the bifurcation of both the LAD in     the circumflex. 3. Left main.  There was terminal 70% narrowing. 4. LAD.  There was ostial 90% narrowing of the LAD with sequential     areas of 60 and 70% narrowing in the mid LAD.  The first and second     diagonals were relatively small in both length and diameter. 5. Circumflex.  The  circumflex had minor irregularities throughout.     There was a large OM #1 and a small OM #2. 6. Right coronary artery.  Minor irregularities in the proximal     portion and then a subtotaled proximal PDA.  Of note, the ostium of     acute marginal was subtotaled also. 7. Ventriculography.  Ventriculography in RAO projection revealed     normal LV systolic function.  Ejection fraction greater than 60%,     no wall motion abnormalities.  CONCLUSION:  Severe three-vessel disease with normal LV systolic function.  DISCUSSION:  It appears that the nuclear portion was false positive because of balanced ischemia.  He clearly has an ischemic response on his EKG with exercise and because of the high-grade stenosis in the LAD, left main system and in the PDA, he needs bypass surgery.  I have called [facility] who he will contact the patient for an appointment to see him in the office.          ______________________________ Thereasa Solo. Little, M.D.     ABL/MEDQ  D:  08/18/2010  T:  08/18/2010  Job:  [id number]  cc:   Landry Corporal, MD Soyla Murphy. Renne Crigler, M.D.  Electronically Signed by Julieanne Manson M.D. on 09/10/2010 02:27:12 PM

## 2010-09-10 NOTE — Discharge Summary (Signed)
  NAMEAXLE, PARFAIT NO.:  [id number]  MEDICAL RECORD NO.:  [id number]  LOCATION:  2018                         FACILITY:  [facility]  PHYSICIAN:  Sheliah Plane, MD    DATE OF BIRTH:  06-15-1945  DATE OF ADMISSION:  08/29/2010 DATE OF DISCHARGE:                              DISCHARGE SUMMARY   ADDENDUM:  On morning rounds of September 02, 2010, the patient is noted to be progressing well.  He is in normal sinus rhythm, blood pressure is stable, O2 sats are greater that 90% on room air.  Laboratory shows sodium of 138, potassium 3.4, chloride 100, bicarbonate 32, BUN is 17, creatine 1.16, glucose is 109.  Incision are clean, dry and intact and healing well.  The patient is felt to be stable and ready for discharge to home today on September 02, 2010.  He was given a dose of potassium this morning for hypokalemia.  Please see dictated discharge summary for followup appointments and discharge instructions.  Discharge medications as dictated with the addition of Voltaren 50 mg one to two tablets q.4 h. p.r.n. pain.     Sol Blazing, PA   ______________________________ Sheliah Plane, MD    KMD/MEDQ  D:  09/02/2010  T:  09/02/2010  Job:  [id number]  cc:   Landry Corporal, MD  Electronically Signed by Cameron Proud PA on 09/06/2010 10:51:43 AM Electronically Signed by Sheliah Plane MD on 09/10/2010 12:42:03 PM

## 2010-09-14 NOTE — Discharge Summary (Signed)
[name], [name] NO.:  [id number]  MEDICAL RECORD NO.:  [id number]  LOCATION:  XRAY                         FACILITY:  [facility]  PHYSICIAN:  Sheliah Plane, MD    DATE OF BIRTH:  Mar 30, 1945  DATE OF ADMISSION:  08/25/2010 DATE OF DISCHARGE:  08/25/2010                              DISCHARGE SUMMARY   HISTORY:  The patient is a 65 year old male referred to Dr. Tyrone Sage for consideration of coronary artery bypass grafting.  He was noted to have some subtle EKG changes on his annual physical and was referred in mid July for a stress test at [facility].  This was done on July 27, 2010.  The patient did have some shortness of breath and generalized fatigue.  He exercised up to 11 mets.  There was a 0.5- mm ST depression inferiorly that was electively positive for ischemia. There was a normal myocardial perfusion study.  Subsequent to this, he underwent cardiac catheterization where he was found to have severe leftmain and LAD lesions.  He was referred to Dr. Tyrone Sage for surgical opinion.  Dr. Tyrone Sage evaluated the patient and his studies and agreed with recommendations to proceed with surgical revascularization.  PAST MEDICAL HISTORY:  He chewed tobacco for 30 years but quit 3 years ago.  He has a history of hypertension, hypercholesterolemia, history of microscopic hematuria, history of left eye hemorrhage with visual changes several years ago which is improved.  He denies diabetes.  He is a nonsmoker.  SOCIAL HISTORY:  He is married.  He denies alcohol use.  FAMILY HISTORY:  His father died at age 38 of heart failure.  His father died at age 35 of renal carcinoma.  His mother is alive at 2.  He has one brother with a questionable history of heart disease.  The details are not known.  MEDICATIONS PRIOR TO ADMISSION: 1. P.r.n. Indocin 25 mg daily. 2. Micardis/hydrochlorothiazide 40/12.5 mg daily. 3. Metoprolol 25 mg daily. 4.  Testosterone 1.5 mL injection once a month. 5. Doxycycline 100 mg a day p.r.n. 6. Zyrtec 10 mg daily p.r.n. 7. Protonix 40 mg daily. 8. Levitra 10 mg p.r.n. 9. Fish oil 1200 mg daily. 10.Caltrate and vitamin D 600 mg daily. 11.Tylenol 650 mg daily. 12.Lorazepam 0.5 mg p.r.n. 13.Finacea gel 15%. 14.Vitamin B12 25 mcg daily.  ALLERGIES:  SULFA causes rash; CEPHALOSPORINS cause weakness, nervousness and increased heart rate; CHOLESTEROL MEDICATIONS cause elevated muscle enzymes; MAXZIDE causes increased heart rate; LISINOPRIL causes cough.  REVIEW OF SYMPTOMS:  Please see the history and physical.  PHYSICAL EXAMINATION:  Please see the history and physical.  Cardiac catheterization report showed a 70% left main lesion, 70-80% proximal and mid LAD lesions in the right coronary artery was patent. There is diffuse disease in the PDA and small acute marginal is diseased.  HOSPITAL COURSE:  The patient was admitted.  On August 29, 2010, he underwent the following procedure.  Coronary artery bypass grafting x4. The following grafts were placed: 1. Left internal mammary artery to the LAD. 2. Saphenous vein graft to the posterior descending. 3. Saphenous vein graft to the obtuse marginal 1. 4. Saphenous vein graft to  diagonal.  The patient tolerated the     procedure well and was taken to surgical intensive care unit in     stable condition.  POSTOPERATIVE HOSPITAL COURSE:  The patient has progressed nicely.  He has remained neurologically unchanged.  Hemodynamically, he has been stable without significant cardiac dysrhythmias.  He was weaned from the ventilator without difficulty.  He does have an expected acute blood loss anemia with stable values.  Most recent hemoglobin/hematocrit dated September 01, 2010, are 12.2 and 35.9 respectively.  Electrolytes, BUN and creatinine are within normal limits.  Oxygen has been weaned.  He maintains good saturations on room air.  Incisions are  healing well without evidence of infection.  He is tolerating routine advancement in activities using standard protocols.  Does have a mild volume overload, but this responded well to diuretics.  Blood pressure is under adequate control.  He is felt to be tentatively stable for discharge in the morning of September 02, 2010.  Pending morning round reevaluation.  DISCHARGE MEDICATIONS:  Medications at the time of this dictation for discharge include the following: 1. Aspirin 325 mg enteric-coated tablet once daily. 2. Crestor 5 mg daily. 3. Metoprolol 25 mg twice daily. 4. Oxycodone 5 mg one every 4 hours p.r.n. 5. Caltrate 600 mg plus vitamin D 400 mg 1 tablet daily. 6. Doxycycline 100 mg daily p.r.n. for rosacea. 7. Finacea gel 15% daily p.r.n. topically. 8. Fish oil 1200 mg p.o. daily. 9. Levitra 10 mg p.r.n. 10.Lorazepam 0.5 mg p.r.n. 11.Micardis/HCT 80/25 mg.  The patient cuts the tablet in half q.a.m. 12.Protonix 40 mg q.a.m. 13.Testosterone 300 mg injection IM q. 30 days. 14.Tylenol p.r.n. 15.Vitamin B12 2500 mcg daily. 16.Zyrtec 10 mg daily p.r.n.  INSTRUCTIONS:  The patient received written instructions in regard to medications, activity, diet, wound care and followup.  Followup should include Dr. Herbie Baltimore in 2 weeks, Dr. Tyrone Sage on September 21, 2010 at 1:30 p.m. with a chest x-ray.  FINAL DIAGNOSIS:  Severe coronary artery disease, now status post surgical revascularization as described.  OTHER DIAGNOSES:  Include postoperative acute blood loss anemia, mild, stable; postoperative volume overload, mild, improved; history of chewing tobacco use x30 years; history of hypertension; history of hypercholesterolemia, history of microscopic hematuria; history of left eye hemorrhage; history of multiple other allergies/intolerance as described above.  Note, he does have an intolerance to statins, but he has been improved through Cardiology recommendations to have Crestor a  low-dose as a trial at this time.     Rowe Clack, P.A.-C.   ______________________________ Sheliah Plane, MD    WEG/MEDQ  D:  09/01/2010  T:  09/01/2010  Job:  [id number]  cc:   Landry Corporal, MD Soyla Murphy. Renne Crigler, M.D.  Electronically Signed by Gershon Crane P.A.-C. on 09/14/2010 12:24:38 PM Electronically Signed by Sheliah Plane MD on 09/14/2010 09:37:05 PM

## 2010-09-19 ENCOUNTER — Other Ambulatory Visit: Payer: Self-pay | Admitting: Cardiothoracic Surgery

## 2010-09-19 DIAGNOSIS — I2510 Atherosclerotic heart disease of native coronary artery without angina pectoris: Secondary | ICD-10-CM

## 2010-09-21 ENCOUNTER — Encounter: Payer: Self-pay | Admitting: *Deleted

## 2010-09-21 ENCOUNTER — Ambulatory Visit
Admission: RE | Admit: 2010-09-21 | Discharge: 2010-09-21 | Disposition: A | Discharge status: Home / Self Care | Payer: Medicare Other | Source: Ambulatory Visit | Attending: Cardiothoracic Surgery | Admitting: Cardiothoracic Surgery

## 2010-09-21 ENCOUNTER — Encounter: Payer: Self-pay | Admitting: Cardiothoracic Surgery

## 2010-09-21 ENCOUNTER — Ambulatory Visit (INDEPENDENT_AMBULATORY_CARE_PROVIDER_SITE_OTHER): Payer: Self-pay | Admitting: Cardiothoracic Surgery

## 2010-09-21 VITALS — BP 120/69 | HR 69 | Resp 18 | Ht 70.0 in | Wt 169.0 lb

## 2010-09-21 DIAGNOSIS — I2510 Atherosclerotic heart disease of native coronary artery without angina pectoris: Secondary | ICD-10-CM

## 2010-09-21 DIAGNOSIS — Z951 Presence of aortocoronary bypass graft: Secondary | ICD-10-CM

## 2010-09-21 DIAGNOSIS — I10 Essential (primary) hypertension: Secondary | ICD-10-CM | POA: Insufficient documentation

## 2010-09-21 MED ORDER — METOPROLOL TARTRATE 25 MG PO TABS: ORAL_TABLET | ORAL | Status: DC

## 2010-09-21 NOTE — Patient Instructions (Addendum)
CABG (Coronary Artery Bypass Grafting) Care After Recovery from any surgery will be different for everyone. Some people feel quite well after 3 or 4 weeks, while others take longer depending on their condition before surgery. These instructions are general guidelines on caring for yourself after you leave the hospital. Always follow your doctor's specific instructions.   IT MAY BE NORMAL TO:  Not have much appetite, feel nauseated by the smell of some foods or only want to eat one or two things.   Have swelling in your legs, especially if you have an incision. Keeping your legs up will help. Wear elastic or compression stockings if you have them.   Have trouble sleeping at night. Sometimes taking a pain pill before bedtime helps. You may not need as much sleep at night if you are napping during the day.   Be constipated because of changes in your diet, activity and medicines. Ask about stool softeners. Try to include more fiber, fruits and vegetables in your diet.   Feel sad or unhappy. You may be frustrated or cranky. You may have good days and bad days. Do not give up. Getting better takes time.   Have a lump at the top of your chest incision.   Experience muscle pain or tightness in your shoulders or upper back. Time and pain medicine may help this discomfort.   Have numbness to the side of your incision if an artery in your chest was used for bypass.   Need physical therapy if you have weakness or tingling in one or both of your arms.   Be confused or unable to think clearly if your CABG was done with a heart-lung machine. This usually gets better in 6 weeks or so.  MEDICINES  You should have a list of all the medicines you will be taking before you leave the hospital. Ask a nurse or pharmacist to help you.   For every medicine, the list should include:   Name.     Exact dose.     Time of day to be taken.     Any other details.      Be sure you understand this list. Keep a  copy with you at all times.   Do not add or stop taking any medicine until you check with your doctor.  Medicines may have side effects. Side effects are not the same as allergies. Call the doctor who prescribed the medicine if you:  Start throwing up, have diarrhea or stomach pain.   Feel dizzy or lightheaded when you stand up.   Are confused, have trouble walking or keep dropping things.   Feel that your heart is skipping beats, or beating too fast or too slow.   Develop a rash.   Notice unusual bruising or bleeding.  CARE OF YOUR CHEST INCISION  Tell your surgeon right away if you notice clicking in your chest (sternum).   Support your chest with a pillow or your arms when you cough and take deep breaths.   Follow instructions about when you may bathe or shower.   The tapes may fall off on their own. If they do not, you may gently peel them off after 7 days.   Tell your surgeon if you notice:   Increased tenderness of your incision.   More redness or swelling.   Drainage or pus.   Only use lotions, creams or oils if approved by your surgeon.   Protect your incision from sunlight during the first  year to keep the scar from getting darker.  CARE OF YOUR LEG INCISION(S)  Follow instructions about bathing and bandage changes.   If you have staples, they have to be removed by the home health nurse or at the clinic.   Avoid crossing your legs.   Change positions every half hour.   Elevate your leg(s) when you are sitting. You may put your legs on the arm of a sofa if you are lying down.   Check your leg(s) daily for swelling. Check the incisions for redness or drainage.   If you have them, wear your elastic stockings while you are up for at least 2 weeks. Take them off at bedtime. They will help reduce swelling.  DIET  Start making changes when your appetite is back to normal.   Most doctors advise a low fat, low salt diet to lower the risk of more heart problems.  You may be given specific goals for how much sodium and fat you should consume every day.   A dietician may help you learn how to plan meals and make better choices about what you eat and drink.  WEIGHT  Weighing yourself every day is important so you know if you are retaining fluid that may make your heart or lungs have to work harder. Use the same scale each time.   Weigh your self every morning at the same time after you go to the bathroom, but before breakfast.   Your weight will be more accurate if you do not wear any clothes.   Record your weight.   Tell your doctor if you have gained 2 pounds or more overnight.  ACTIVITY Stop any activity at once if you feel short of breath, notice irregular heart beats, have chest pain or feel faint or dizzy. Tell your doctor or get help right away if the problem does not go away when you stop. Showers. You may be able to take showers after your staples and pacing wires are out. Avoid soaking in a tub or near water jets until your incisions are healed. Rest. You need a balance of rest and activity. You can rest by sitting quietly or by napping if you feel tired. Walking. You will have started by in the hospital. Walk at your own pace and increase gradually. Wear sturdy shoes, not slippers. Dress for the weather if you walk outdoors. Rehab programs usually begin 4 weeks after surgery, but with the guidance of your doctors, you may begin to exercise sooner. Climbing stairs. Unless your doctor tells you not to, go up stairs slowly and rest if you tire. Do not pull yourself up by the handrail. Use your thigh muscles to lift your legs.   Driving a car. You may ride as a passenger at any time. To allow your chest to heal properly, avoid driving, motorcycle or outdoor bicycle riding for the first 8 weeks. When traveling, get out of the car and walk around for a few minutes every 2 hours. Lifting. Avoid lifting, pushing or pulling anything heavier than 10 pounds  for 6 weeks after surgery. This includes carrying children, groceries, suitcases, mowing the grass and similar activities. Do not hold your breath during any activity, especially when lifting or using the rest room. Returning to work. Check with your surgeon. People heal at different rates, but most people will be able to go back to work 6 to 12 weeks after surgery if their job is not strenuous. Sexual. You may resume sexual relations  when you feel comfortable. This is 2 to 4 weeks after discharge for most people. Ask your doctor or nurse for detailed information. EXERCISE GUIDELINES Exercise guidelines are usually determined by a stress test done about 4 weeks after surgery. Heart rate limits are generally set at 70 percent of those reached on this first stress test.  Stop any exercise if you:   Have trouble breathing.   Get leg cramps.      Chest pain (angina).   Unusual fatigue.       Modify your next exercise session if your pulse after exercise is more than 30 beats faster than your resting rate.  CALL YOUR SURGEON     If any of your incisions are red, oozing, bleeding or the edges are separated.   There is a "clicking" in your sternum when you move.    A fever of 101 twice in 24 hours.    You have more ankle swelling, leg pain, or pain in your calf.    For any questions about your surgery.  CALL YOUR CARDIOLOGIST   If your heart beats are irregular and/or fast.   If you are more short of breath.    You have any questions about your medicines.    Weight gain of more than 2 pounds a day.    Feeling dizzy or lightheaded when you first stand up.    With questions about home health care or agencies in your community for help or transportation.    Painful, frequent or bloody urination.    GET IMMEDIATE MEDICAL HELP IF YOU HAVE:  Angina or chest pain that goes to your jaw or arms.   Sudden shortness of breath, that does not go away with rest.   Fast or irregular heart  beats and trouble breathing.   Numbness or weakness in your arms or legs.   Coughing up bright red blood.   Sudden severe headache.  Document Released: 07/14/2004 Document Re-Released: 06/14/2009

## 2010-09-21 NOTE — Progress Notes (Signed)
HPI  Patient returns for a postop visit after coronary artery bypass grafting for severe left main and left anterior descending coronary artery disease. On 08/29/2002 he underwent coronary artery bypass grafting x4 with left internal mammary to the left anterior descending coronary artery reverse saphenous vein graft to the diagonal coronary artery reverse saphenous vein graft to the first obtuse marginal coronary and reverse saphenous vein graft to the Esther descending coronary artery. Postop she's made good progress at home he is to start in cardiac rehabilitation next week. He's had no recurrent angina or evidence of congestive heart failure.   Current Outpatient Prescriptions  Medication Sig Dispense Refill  . acetaminophen (TYLENOL) 100 MG/ML solution Take 10 mg/kg by mouth every 4 (four) hours as needed.        [name] aspirin 325 MG EC tablet Take 325 mg by mouth daily.        . Azelaic Acid (FINACEA) 15 % cream Apply topically as needed. After skin is thoroughly washed and patted dry, gently but thoroughly massage a thin film of azelaic acid cream into the affected area twice daily, in the morning and evening.       . Calcium Carbonate-Vitamin D (CALCIUM 600+D) 600-400 MG-UNIT per tablet Take 1 tablet by mouth daily.        . Coenzyme Q10 (CO Q-10) 100 MG CAPS Take 1 tablet by mouth 1 day or 1 dose.        [name] CRESTOR 5 MG tablet Take 5 mg by mouth daily.       . Cyanocobalamin (B-12) 2500 MCG TABS Take by mouth 1 day or 1 dose.        [name] doxycycline (VIBRAMYCIN) 100 MG capsule Take 100 mg by mouth daily as needed.        . fish oil-omega-3 fatty acids 1000 MG capsule Take 1,200 g by mouth daily.        . metoprolol tartrate (LOPRESSOR) 25 MG tablet Half tab twice a day      . pantoprazole (PROTONIX) 40 MG tablet Take 40 mg by mouth daily.        [name] telmisartan-hydrochlorothiazide (MICARDIS HCT) 40-12.5 MG per tablet Take 1 tablet by mouth daily.        [name] testosterone cypionate (DEPOTESTOTERONE  CYPIONATE) 100 MG/ML injection Inject 150 mg into the muscle every 30 (thirty) days.        . traMADol (ULTRAM) 50 MG tablet 50 mg every 6 (six) hours as needed.       . vardenafil (LEVITRA) 10 MG tablet 10 mg as needed.             Physical Exam  Constitutional: He appears well-developed and well-nourished.  Cardiovascular: Normal rate, regular rhythm, normal heart sounds and intact distal pulses.  Exam reveals no gallop and no friction rub.   Pulmonary/Chest: Effort normal and breath sounds normal. No respiratory distress. He has no wheezes. He has no rales. He exhibits no tenderness.  Abdominal: Soft. Bowel sounds are normal.  Musculoskeletal: He exhibits no edema and no tenderness.  Skin: Skin is warm and dry.   the patient's right and the vein harvest sites healing well His sternum is stable and also well healed without any evidence of infection   Diagnostic Tests:  Chest x-ray is very slight blunting of the left costophrenic angle otherwise clear lung field   Impression: Stable progress after coronary artery bypass graft. I've encouraged the patient to start in cardiac rehabilitation. We reviewed his  limitations on lifting for the next several months. We have allowed him to return to driving.

## 2010-10-02 ENCOUNTER — Encounter (HOSPITAL_COMMUNITY)
Admission: RE | Admit: 2010-10-02 | Discharge: 2010-10-02 | Disposition: A | Discharge status: Home / Self Care | Payer: Medicare Other | Source: Ambulatory Visit | Attending: Cardiology | Admitting: Cardiology

## 2010-10-02 DIAGNOSIS — Z5189 Encounter for other specified aftercare: Secondary | ICD-10-CM | POA: Insufficient documentation

## 2010-10-02 DIAGNOSIS — E7800 Pure hypercholesterolemia, unspecified: Secondary | ICD-10-CM | POA: Insufficient documentation

## 2010-10-02 DIAGNOSIS — Z7982 Long term (current) use of aspirin: Secondary | ICD-10-CM | POA: Insufficient documentation

## 2010-10-02 DIAGNOSIS — Z882 Allergy status to sulfonamides status: Secondary | ICD-10-CM | POA: Insufficient documentation

## 2010-10-02 DIAGNOSIS — K219 Gastro-esophageal reflux disease without esophagitis: Secondary | ICD-10-CM | POA: Insufficient documentation

## 2010-10-02 DIAGNOSIS — Z951 Presence of aortocoronary bypass graft: Secondary | ICD-10-CM | POA: Insufficient documentation

## 2010-10-02 DIAGNOSIS — I2510 Atherosclerotic heart disease of native coronary artery without angina pectoris: Secondary | ICD-10-CM | POA: Insufficient documentation

## 2010-10-02 DIAGNOSIS — I10 Essential (primary) hypertension: Secondary | ICD-10-CM | POA: Insufficient documentation

## 2010-10-02 DIAGNOSIS — Z87891 Personal history of nicotine dependence: Secondary | ICD-10-CM | POA: Insufficient documentation

## 2010-10-02 DIAGNOSIS — Z79899 Other long term (current) drug therapy: Secondary | ICD-10-CM | POA: Insufficient documentation

## 2010-10-02 DIAGNOSIS — Z7902 Long term (current) use of antithrombotics/antiplatelets: Secondary | ICD-10-CM | POA: Insufficient documentation

## 2010-10-04 ENCOUNTER — Encounter (HOSPITAL_COMMUNITY): Payer: Medicare Other

## 2010-10-06 ENCOUNTER — Encounter (HOSPITAL_COMMUNITY): Payer: Medicare Other

## 2010-10-09 ENCOUNTER — Encounter (HOSPITAL_COMMUNITY): Payer: Medicare Other | Attending: Cardiology

## 2010-10-09 ENCOUNTER — Encounter: Payer: Self-pay | Admitting: Physician Assistant

## 2010-10-09 ENCOUNTER — Ambulatory Visit (INDEPENDENT_AMBULATORY_CARE_PROVIDER_SITE_OTHER): Payer: Self-pay | Admitting: Physician Assistant

## 2010-10-09 VITALS — BP 114/64 | HR 76 | Resp 16 | Ht 70.0 in | Wt 165.2 lb

## 2010-10-09 DIAGNOSIS — Z7902 Long term (current) use of antithrombotics/antiplatelets: Secondary | ICD-10-CM | POA: Insufficient documentation

## 2010-10-09 DIAGNOSIS — I2510 Atherosclerotic heart disease of native coronary artery without angina pectoris: Secondary | ICD-10-CM

## 2010-10-09 DIAGNOSIS — I10 Essential (primary) hypertension: Secondary | ICD-10-CM | POA: Insufficient documentation

## 2010-10-09 DIAGNOSIS — Z5189 Encounter for other specified aftercare: Secondary | ICD-10-CM | POA: Insufficient documentation

## 2010-10-09 DIAGNOSIS — Z882 Allergy status to sulfonamides status: Secondary | ICD-10-CM | POA: Insufficient documentation

## 2010-10-09 DIAGNOSIS — Z951 Presence of aortocoronary bypass graft: Secondary | ICD-10-CM | POA: Insufficient documentation

## 2010-10-09 DIAGNOSIS — K219 Gastro-esophageal reflux disease without esophagitis: Secondary | ICD-10-CM | POA: Insufficient documentation

## 2010-10-09 DIAGNOSIS — Z79899 Other long term (current) drug therapy: Secondary | ICD-10-CM | POA: Insufficient documentation

## 2010-10-09 DIAGNOSIS — Z7982 Long term (current) use of aspirin: Secondary | ICD-10-CM | POA: Insufficient documentation

## 2010-10-09 DIAGNOSIS — Z87891 Personal history of nicotine dependence: Secondary | ICD-10-CM | POA: Insufficient documentation

## 2010-10-09 DIAGNOSIS — E7800 Pure hypercholesterolemia, unspecified: Secondary | ICD-10-CM | POA: Insufficient documentation

## 2010-10-09 NOTE — Progress Notes (Signed)
HPI Mr. Johannes is status post coronary bypass grafting x4 done by Dr. Tyrone Sage on 08/29/2010.   Patient was seen by Dr. Tyrone Sage  On September 13.  At that time patient is progressing well and Dr. Tyrone Sage released him from the practice.  He contacted our office today with concerns of his sternal incision.  He states he noted some swelling and some blue discoloration.  He states this started after he had started outpatient cardiac rehabilitation.  Otherwise patient says that he is progressing well.     Current Outpatient Prescriptions  Medication Sig Dispense Refill  . acetaminophen (TYLENOL) 100 MG/ML solution Take 10 mg/kg by mouth every 4 (four) hours as needed.        [name] aspirin 325 MG EC tablet Take 325 mg by mouth daily.        . Azelaic Acid (FINACEA) 15 % cream Apply topically as needed. After skin is thoroughly washed and patted dry, gently but thoroughly massage a thin film of azelaic acid cream into the affected area twice daily, in the morning and evening.       . Calcium Carbonate-Vitamin D (CALCIUM 600+D) 600-400 MG-UNIT per tablet Take 1 tablet by mouth daily.        . Coenzyme Q10 (CO Q-10) 100 MG CAPS Take 1 tablet by mouth 1 day or 1 dose.        [name] CRESTOR 5 MG tablet Take 5 mg by mouth daily.       . Cyanocobalamin (B-12) 2500 MCG TABS Take by mouth 1 day or 1 dose.        [name] doxycycline (VIBRAMYCIN) 100 MG capsule Take 100 mg by mouth daily as needed.        . fish oil-omega-3 fatty acids 1000 MG capsule Take 1,200 g by mouth daily.        . metoprolol tartrate (LOPRESSOR) 25 MG tablet Half tab twice a day      . pantoprazole (PROTONIX) 40 MG tablet Take 40 mg by mouth daily.        [name] telmisartan-hydrochlorothiazide (MICARDIS HCT) 40-12.5 MG per tablet Take 1 tablet by mouth daily.        [name] testosterone cypionate (DEPOTESTOTERONE CYPIONATE) 100 MG/ML injection Inject 150 mg into the muscle every 30 (thirty) days.        . traMADol (ULTRAM) 50 MG tablet 50 mg every 6 (six) hours as  needed.       . vardenafil (LEVITRA) 10 MG tablet 10 mg as needed.              Physical Exam  Constitutional: He is oriented to person, place, and time. He appears well-developed and well-nourished.  Cardiovascular: Normal rate, regular rhythm, normal heart sounds and intact distal pulses.   Pulmonary/Chest: Effort normal and breath sounds normal.  Abdominal: Soft. Bowel sounds are normal.  Musculoskeletal: Normal range of motion.  Neurological: He is alert and oriented to person, place, and time. He has normal reflexes.  Skin: Skin is warm and dry.       All incisions are clean dry and intact and healing well.  Noted that patient's distal sternal incision safety a small superficial hematoma.  This area is noted to be soft and nontender.  Sternum intact.   BP 114/64  Pulse 76  Resp 16  Ht 5\' 10"  (1.778 m)  Wt 165 lb 3.2 oz (74.934 kg)  BMI 23.70 kg/m2  SpO2 98%   Impression/Plan:  Mr. Ledwith is progressing  well.  His sternal incision is healing well.  He does have a small superficial hematoma noted.  Patient instructed if this increases in size he is to contact us and we will see him back.  He is to continue progressing with cardiac rehab.  It is okay for him to continue to drive.  He still lifting over 10 pounds for 6 weeks.  We will follow up with patient on an as-needed basis.  Patient is in agreement.

## 2010-10-09 NOTE — Patient Instructions (Signed)
Continue to followup with Dr. Clarene Duke for medical management.  Still no heavy lifting over 10 pounds for another 6 weeks.  Okay to drive short distances.  We will followup as needed.

## 2010-10-11 ENCOUNTER — Encounter (HOSPITAL_COMMUNITY): Payer: Medicare Other

## 2010-10-13 ENCOUNTER — Encounter (HOSPITAL_COMMUNITY): Payer: Medicare Other

## 2010-10-16 ENCOUNTER — Encounter (HOSPITAL_COMMUNITY): Payer: Medicare Other

## 2010-10-18 ENCOUNTER — Encounter (HOSPITAL_COMMUNITY): Payer: Medicare Other

## 2010-10-20 ENCOUNTER — Encounter (HOSPITAL_COMMUNITY): Payer: Medicare Other

## 2010-10-23 ENCOUNTER — Encounter (HOSPITAL_COMMUNITY): Payer: Medicare Other

## 2010-10-25 ENCOUNTER — Encounter (HOSPITAL_COMMUNITY): Payer: Medicare Other

## 2010-10-27 ENCOUNTER — Encounter (HOSPITAL_COMMUNITY): Payer: Medicare Other

## 2010-10-30 ENCOUNTER — Encounter (HOSPITAL_COMMUNITY): Payer: Medicare Other

## 2010-11-01 ENCOUNTER — Encounter (HOSPITAL_COMMUNITY): Payer: Medicare Other

## 2010-11-03 ENCOUNTER — Encounter (HOSPITAL_COMMUNITY): Payer: Medicare Other

## 2010-11-06 ENCOUNTER — Encounter (HOSPITAL_COMMUNITY): Payer: Medicare Other

## 2010-11-08 ENCOUNTER — Encounter (HOSPITAL_COMMUNITY): Payer: Medicare Other

## 2010-11-10 ENCOUNTER — Encounter (HOSPITAL_COMMUNITY): Payer: Medicare Other

## 2010-11-10 DIAGNOSIS — I2510 Atherosclerotic heart disease of native coronary artery without angina pectoris: Secondary | ICD-10-CM | POA: Insufficient documentation

## 2010-11-10 DIAGNOSIS — Z79899 Other long term (current) drug therapy: Secondary | ICD-10-CM | POA: Insufficient documentation

## 2010-11-10 DIAGNOSIS — Z882 Allergy status to sulfonamides status: Secondary | ICD-10-CM | POA: Insufficient documentation

## 2010-11-10 DIAGNOSIS — Z951 Presence of aortocoronary bypass graft: Secondary | ICD-10-CM | POA: Insufficient documentation

## 2010-11-10 DIAGNOSIS — I10 Essential (primary) hypertension: Secondary | ICD-10-CM | POA: Insufficient documentation

## 2010-11-10 DIAGNOSIS — Z5189 Encounter for other specified aftercare: Secondary | ICD-10-CM | POA: Insufficient documentation

## 2010-11-10 DIAGNOSIS — Z7982 Long term (current) use of aspirin: Secondary | ICD-10-CM | POA: Insufficient documentation

## 2010-11-10 DIAGNOSIS — Z7902 Long term (current) use of antithrombotics/antiplatelets: Secondary | ICD-10-CM | POA: Insufficient documentation

## 2010-11-10 DIAGNOSIS — Z87891 Personal history of nicotine dependence: Secondary | ICD-10-CM | POA: Insufficient documentation

## 2010-11-10 DIAGNOSIS — E7800 Pure hypercholesterolemia, unspecified: Secondary | ICD-10-CM | POA: Insufficient documentation

## 2010-11-10 DIAGNOSIS — K219 Gastro-esophageal reflux disease without esophagitis: Secondary | ICD-10-CM | POA: Insufficient documentation

## 2010-11-13 ENCOUNTER — Encounter (HOSPITAL_COMMUNITY): Payer: Medicare Other

## 2010-11-15 ENCOUNTER — Encounter (HOSPITAL_COMMUNITY): Payer: Medicare Other

## 2010-11-17 ENCOUNTER — Encounter (HOSPITAL_COMMUNITY): Payer: Medicare Other

## 2010-11-20 ENCOUNTER — Encounter (HOSPITAL_COMMUNITY): Payer: Medicare Other

## 2010-11-22 ENCOUNTER — Encounter (HOSPITAL_COMMUNITY): Payer: Medicare Other

## 2010-11-24 ENCOUNTER — Encounter (HOSPITAL_COMMUNITY)
Admission: RE | Admit: 2010-11-24 | Discharge: 2010-11-24 | Disposition: A | Discharge status: Home / Self Care | Payer: Medicare Other | Source: Ambulatory Visit | Attending: Cardiology | Admitting: Cardiology

## 2010-11-27 ENCOUNTER — Encounter (HOSPITAL_COMMUNITY)
Admission: RE | Admit: 2010-11-27 | Discharge: 2010-11-27 | Disposition: A | Discharge status: Home / Self Care | Payer: Medicare Other | Source: Ambulatory Visit | Attending: Cardiology | Admitting: Cardiology

## 2010-11-29 ENCOUNTER — Encounter (HOSPITAL_COMMUNITY)
Admission: RE | Admit: 2010-11-29 | Discharge: 2010-11-29 | Disposition: A | Discharge status: Home / Self Care | Payer: Medicare Other | Source: Ambulatory Visit | Attending: Cardiology | Admitting: Cardiology

## 2010-12-01 ENCOUNTER — Encounter (HOSPITAL_COMMUNITY): Payer: Medicare Other

## 2010-12-04 ENCOUNTER — Encounter (HOSPITAL_COMMUNITY)
Admission: RE | Admit: 2010-12-04 | Discharge: 2010-12-04 | Disposition: A | Discharge status: Home / Self Care | Payer: Medicare Other | Source: Ambulatory Visit | Attending: Cardiology | Admitting: Cardiology

## 2010-12-06 ENCOUNTER — Encounter (HOSPITAL_COMMUNITY)
Admission: RE | Admit: 2010-12-06 | Discharge: 2010-12-06 | Disposition: A | Discharge status: Home / Self Care | Payer: Medicare Other | Source: Ambulatory Visit | Attending: Cardiology | Admitting: Cardiology

## 2010-12-08 ENCOUNTER — Encounter (HOSPITAL_COMMUNITY)
Admission: RE | Admit: 2010-12-08 | Discharge: 2010-12-08 | Disposition: A | Discharge status: Home / Self Care | Payer: Medicare Other | Source: Ambulatory Visit | Attending: Cardiology | Admitting: Cardiology

## 2010-12-11 ENCOUNTER — Encounter (HOSPITAL_COMMUNITY)
Admission: RE | Admit: 2010-12-11 | Discharge: 2010-12-11 | Disposition: A | Discharge status: Home / Self Care | Payer: Medicare Other | Source: Ambulatory Visit | Attending: Cardiology | Admitting: Cardiology

## 2010-12-11 DIAGNOSIS — I10 Essential (primary) hypertension: Secondary | ICD-10-CM | POA: Insufficient documentation

## 2010-12-11 DIAGNOSIS — Z951 Presence of aortocoronary bypass graft: Secondary | ICD-10-CM | POA: Insufficient documentation

## 2010-12-11 DIAGNOSIS — Z5189 Encounter for other specified aftercare: Secondary | ICD-10-CM | POA: Insufficient documentation

## 2010-12-11 DIAGNOSIS — I2510 Atherosclerotic heart disease of native coronary artery without angina pectoris: Secondary | ICD-10-CM | POA: Insufficient documentation

## 2010-12-11 DIAGNOSIS — K219 Gastro-esophageal reflux disease without esophagitis: Secondary | ICD-10-CM | POA: Insufficient documentation

## 2010-12-11 DIAGNOSIS — Z87891 Personal history of nicotine dependence: Secondary | ICD-10-CM | POA: Insufficient documentation

## 2010-12-11 DIAGNOSIS — Z79899 Other long term (current) drug therapy: Secondary | ICD-10-CM | POA: Insufficient documentation

## 2010-12-11 DIAGNOSIS — Z7902 Long term (current) use of antithrombotics/antiplatelets: Secondary | ICD-10-CM | POA: Insufficient documentation

## 2010-12-11 DIAGNOSIS — E7800 Pure hypercholesterolemia, unspecified: Secondary | ICD-10-CM | POA: Insufficient documentation

## 2010-12-11 DIAGNOSIS — Z7982 Long term (current) use of aspirin: Secondary | ICD-10-CM | POA: Insufficient documentation

## 2010-12-11 DIAGNOSIS — Z882 Allergy status to sulfonamides status: Secondary | ICD-10-CM | POA: Insufficient documentation

## 2010-12-13 ENCOUNTER — Encounter (HOSPITAL_COMMUNITY)
Admission: RE | Admit: 2010-12-13 | Discharge: 2010-12-13 | Disposition: A | Discharge status: Home / Self Care | Payer: Medicare Other | Source: Ambulatory Visit | Attending: Cardiology | Admitting: Cardiology

## 2010-12-15 ENCOUNTER — Encounter (HOSPITAL_COMMUNITY)
Admission: RE | Admit: 2010-12-15 | Discharge: 2010-12-15 | Disposition: A | Discharge status: Home / Self Care | Payer: Medicare Other | Source: Ambulatory Visit | Attending: Cardiology | Admitting: Cardiology

## 2010-12-18 ENCOUNTER — Telehealth (HOSPITAL_COMMUNITY): Payer: Self-pay | Admitting: *Deleted

## 2010-12-18 ENCOUNTER — Encounter (HOSPITAL_COMMUNITY)
Admission: RE | Admit: 2010-12-18 | Discharge: 2010-12-18 | Disposition: A | Discharge status: Home / Self Care | Payer: Medicare Other | Source: Ambulatory Visit | Attending: Cardiology | Admitting: Cardiology

## 2010-12-18 NOTE — Telephone Encounter (Signed)
Pt okay to begin strength training

## 2010-12-20 ENCOUNTER — Encounter (HOSPITAL_COMMUNITY)
Admission: RE | Admit: 2010-12-20 | Discharge: 2010-12-20 | Disposition: A | Discharge status: Home / Self Care | Payer: Medicare Other | Source: Ambulatory Visit | Attending: Cardiology | Admitting: Cardiology

## 2010-12-22 ENCOUNTER — Encounter (HOSPITAL_COMMUNITY)
Admission: RE | Admit: 2010-12-22 | Discharge: 2010-12-22 | Disposition: A | Discharge status: Home / Self Care | Payer: Medicare Other | Source: Ambulatory Visit | Attending: Cardiology | Admitting: Cardiology

## 2010-12-22 NOTE — Progress Notes (Signed)
Pt started weights today.  His BPs were as follows for each exercises: Bench Press 124/56 Leg Extension 124/60 Latissimus Dorsi Pull Down 124/64 Leg Curl 126/64 Bicep/Tricep Set 124/70

## 2010-12-25 ENCOUNTER — Encounter (HOSPITAL_COMMUNITY)
Admission: RE | Admit: 2010-12-25 | Discharge: 2010-12-25 | Disposition: A | Discharge status: Home / Self Care | Payer: Medicare Other | Source: Ambulatory Visit | Attending: Cardiology | Admitting: Cardiology

## 2010-12-27 ENCOUNTER — Encounter (HOSPITAL_COMMUNITY): Payer: Medicare Other

## 2010-12-27 NOTE — Progress Notes (Addendum)
Cardiac Rehabilitation Program Progress Report   Orientation:  09/28/2010 Graduate Date:  12/25/2010 Discharge Date:   # of sessions completed: 36/36  Cardiologist: Woodroe Chen MD:  Amedeo Gory Time:  1115  A.  Exercise Program:  Tolerates exercise @ 5.7 METS for 30 minutes, Bike Test Results:  Pre: 0.92 miles\ and Post: 1.18 miles, Improved functional capacity  28.3 %, Improved  muscular strength  24.3 %, Improved  flexibility 3.5 %, Improved education score 4 % and Discharged to home exercise program.  Anticipated compliance:  excellent  B.  Mental Health:  Good mental attitude  No post QOL scores.  Pre scores are Overall 27.71 Health and Function 26.14 Socioeconomic 29.25 Psych/Spiritual 30 Family 26.40  C.  Education/Instruction/Skills  Accurately checks own pulse.  Rest:  78 Exercise:  124, Knows THR for exercise, Uses Perceived Exertion Scale and/or Dyspnea Scale and Attended 13/13 education classes  Home exercise given: 10/09/10  D.  Nutrition/Weight Control/Body Composition:  Adherence to prescribed nutrition program: good , % Body Fat  24.4 and Patient weight change: no change BMI 24.4  *This section completed by Mickle Plumb, Andres Shad, RD, LDN, CDE  E.  Blood Lipids    No results found for this basename: CHOL     No results found for this basename: TRIG     No results found for this basename: HDL     No results found for this basename: CHOLHDL     No results found for this basename: LDLDIRECT      F.  Lifestyle Changes:  Making positive lifestyle changes  G.  Symptoms noted with exercise:  Resting hypertension and Exertional hypertension  Report Completed By:  Hazle Nordmann   Comments:  Pt did very well in program, progressing from 3.7 METs to 5.7 METs.  Pt plans to continue to exercise by joining the [employer] with his wife.  At d/c the pt rhythm was sinus.      I agree with the above assessment.  Karlene Lineman RN

## 2010-12-29 ENCOUNTER — Encounter (HOSPITAL_COMMUNITY): Payer: Medicare Other

## 2011-01-01 ENCOUNTER — Encounter (HOSPITAL_COMMUNITY): Payer: Medicare Other

## 2011-01-03 ENCOUNTER — Encounter (HOSPITAL_COMMUNITY): Payer: Medicare Other

## 2011-01-05 ENCOUNTER — Encounter (HOSPITAL_COMMUNITY): Payer: Medicare Other

## 2011-01-10 ENCOUNTER — Other Ambulatory Visit: Payer: Self-pay | Admitting: Surgical

## 2011-01-24 DIAGNOSIS — E782 Mixed hyperlipidemia: Secondary | ICD-10-CM | POA: Diagnosis not present

## 2011-01-24 DIAGNOSIS — D8489 Other immunodeficiencies: Secondary | ICD-10-CM | POA: Diagnosis not present

## 2011-01-24 DIAGNOSIS — Z79899 Other long term (current) drug therapy: Secondary | ICD-10-CM | POA: Diagnosis not present

## 2011-01-31 DIAGNOSIS — R9431 Abnormal electrocardiogram [ECG] [EKG]: Secondary | ICD-10-CM | POA: Diagnosis not present

## 2011-01-31 DIAGNOSIS — I2510 Atherosclerotic heart disease of native coronary artery without angina pectoris: Secondary | ICD-10-CM | POA: Diagnosis not present

## 2011-02-06 DIAGNOSIS — I2510 Atherosclerotic heart disease of native coronary artery without angina pectoris: Secondary | ICD-10-CM | POA: Diagnosis not present

## 2011-02-06 DIAGNOSIS — R9431 Abnormal electrocardiogram [ECG] [EKG]: Secondary | ICD-10-CM | POA: Diagnosis not present

## 2011-02-06 DIAGNOSIS — R079 Chest pain, unspecified: Secondary | ICD-10-CM | POA: Diagnosis not present

## 2011-02-06 DIAGNOSIS — Z95818 Presence of other cardiac implants and grafts: Secondary | ICD-10-CM | POA: Diagnosis not present

## 2011-02-06 HISTORY — PX: CARDIOVASCULAR STRESS TEST: SHX262

## 2011-03-05 DIAGNOSIS — D4959 Neoplasm of unspecified behavior of other genitourinary organ: Secondary | ICD-10-CM | POA: Diagnosis not present

## 2011-03-12 DIAGNOSIS — E291 Testicular hypofunction: Secondary | ICD-10-CM | POA: Diagnosis not present

## 2011-03-19 DIAGNOSIS — E291 Testicular hypofunction: Secondary | ICD-10-CM | POA: Diagnosis not present

## 2011-04-05 DIAGNOSIS — J019 Acute sinusitis, unspecified: Secondary | ICD-10-CM | POA: Diagnosis not present

## 2011-04-05 DIAGNOSIS — J029 Acute pharyngitis, unspecified: Secondary | ICD-10-CM | POA: Diagnosis not present

## 2011-04-20 DIAGNOSIS — E291 Testicular hypofunction: Secondary | ICD-10-CM | POA: Diagnosis not present

## 2011-05-03 DIAGNOSIS — E291 Testicular hypofunction: Secondary | ICD-10-CM | POA: Diagnosis not present

## 2011-05-22 DIAGNOSIS — Z9861 Coronary angioplasty status: Secondary | ICD-10-CM | POA: Diagnosis not present

## 2011-05-22 DIAGNOSIS — E782 Mixed hyperlipidemia: Secondary | ICD-10-CM | POA: Diagnosis not present

## 2011-05-22 DIAGNOSIS — I10 Essential (primary) hypertension: Secondary | ICD-10-CM | POA: Diagnosis not present

## 2011-05-22 DIAGNOSIS — I2510 Atherosclerotic heart disease of native coronary artery without angina pectoris: Secondary | ICD-10-CM | POA: Diagnosis not present

## 2011-06-22 DIAGNOSIS — Z79899 Other long term (current) drug therapy: Secondary | ICD-10-CM | POA: Diagnosis not present

## 2011-06-22 DIAGNOSIS — I10 Essential (primary) hypertension: Secondary | ICD-10-CM | POA: Diagnosis not present

## 2011-06-22 DIAGNOSIS — D649 Anemia, unspecified: Secondary | ICD-10-CM | POA: Diagnosis not present

## 2011-06-22 DIAGNOSIS — M899 Disorder of bone, unspecified: Secondary | ICD-10-CM | POA: Diagnosis not present

## 2011-06-22 DIAGNOSIS — M949 Disorder of cartilage, unspecified: Secondary | ICD-10-CM | POA: Diagnosis not present

## 2011-06-27 DIAGNOSIS — E7800 Pure hypercholesterolemia, unspecified: Secondary | ICD-10-CM | POA: Diagnosis not present

## 2011-06-27 DIAGNOSIS — I2510 Atherosclerotic heart disease of native coronary artery without angina pectoris: Secondary | ICD-10-CM | POA: Diagnosis not present

## 2011-06-27 DIAGNOSIS — Z8042 Family history of malignant neoplasm of prostate: Secondary | ICD-10-CM | POA: Diagnosis not present

## 2011-06-27 DIAGNOSIS — N289 Disorder of kidney and ureter, unspecified: Secondary | ICD-10-CM | POA: Diagnosis not present

## 2011-06-27 DIAGNOSIS — M542 Cervicalgia: Secondary | ICD-10-CM | POA: Diagnosis not present

## 2011-07-05 ENCOUNTER — Other Ambulatory Visit: Payer: Self-pay | Admitting: Surgical

## 2011-08-07 DIAGNOSIS — E291 Testicular hypofunction: Secondary | ICD-10-CM | POA: Diagnosis not present

## 2011-08-17 DIAGNOSIS — L819 Disorder of pigmentation, unspecified: Secondary | ICD-10-CM | POA: Diagnosis not present

## 2011-08-17 DIAGNOSIS — L570 Actinic keratosis: Secondary | ICD-10-CM | POA: Diagnosis not present

## 2011-08-17 DIAGNOSIS — L578 Other skin changes due to chronic exposure to nonionizing radiation: Secondary | ICD-10-CM | POA: Diagnosis not present

## 2011-08-17 DIAGNOSIS — D1801 Hemangioma of skin and subcutaneous tissue: Secondary | ICD-10-CM | POA: Diagnosis not present

## 2011-08-28 DIAGNOSIS — E291 Testicular hypofunction: Secondary | ICD-10-CM | POA: Diagnosis not present

## 2011-08-30 DIAGNOSIS — E291 Testicular hypofunction: Secondary | ICD-10-CM | POA: Diagnosis not present

## 2011-08-30 DIAGNOSIS — R3129 Other microscopic hematuria: Secondary | ICD-10-CM | POA: Diagnosis not present

## 2011-10-22 DIAGNOSIS — L819 Disorder of pigmentation, unspecified: Secondary | ICD-10-CM | POA: Diagnosis not present

## 2011-10-22 DIAGNOSIS — D1801 Hemangioma of skin and subcutaneous tissue: Secondary | ICD-10-CM | POA: Diagnosis not present

## 2011-10-22 DIAGNOSIS — L570 Actinic keratosis: Secondary | ICD-10-CM | POA: Diagnosis not present

## 2011-10-22 DIAGNOSIS — D239 Other benign neoplasm of skin, unspecified: Secondary | ICD-10-CM | POA: Diagnosis not present

## 2011-10-22 DIAGNOSIS — L723 Sebaceous cyst: Secondary | ICD-10-CM | POA: Diagnosis not present

## 2011-10-27 ENCOUNTER — Other Ambulatory Visit: Payer: Self-pay | Admitting: Surgical

## 2011-11-02 DIAGNOSIS — Z23 Encounter for immunization: Secondary | ICD-10-CM | POA: Diagnosis not present

## 2011-11-14 DIAGNOSIS — I10 Essential (primary) hypertension: Secondary | ICD-10-CM | POA: Diagnosis not present

## 2011-11-14 DIAGNOSIS — G8912 Acute post-thoracotomy pain: Secondary | ICD-10-CM | POA: Diagnosis not present

## 2011-11-14 DIAGNOSIS — E291 Testicular hypofunction: Secondary | ICD-10-CM | POA: Diagnosis not present

## 2012-01-18 DIAGNOSIS — Z951 Presence of aortocoronary bypass graft: Secondary | ICD-10-CM | POA: Diagnosis not present

## 2012-01-18 DIAGNOSIS — I2510 Atherosclerotic heart disease of native coronary artery without angina pectoris: Secondary | ICD-10-CM | POA: Diagnosis not present

## 2012-01-18 DIAGNOSIS — R9431 Abnormal electrocardiogram [ECG] [EKG]: Secondary | ICD-10-CM | POA: Diagnosis not present

## 2012-01-18 DIAGNOSIS — I10 Essential (primary) hypertension: Secondary | ICD-10-CM | POA: Diagnosis not present

## 2012-01-22 DIAGNOSIS — E782 Mixed hyperlipidemia: Secondary | ICD-10-CM | POA: Diagnosis not present

## 2012-01-22 DIAGNOSIS — Z79899 Other long term (current) drug therapy: Secondary | ICD-10-CM | POA: Diagnosis not present

## 2012-01-23 DIAGNOSIS — Z7982 Long term (current) use of aspirin: Secondary | ICD-10-CM | POA: Diagnosis not present

## 2012-01-23 DIAGNOSIS — Z881 Allergy status to other antibiotic agents status: Secondary | ICD-10-CM | POA: Diagnosis not present

## 2012-01-23 DIAGNOSIS — I10 Essential (primary) hypertension: Secondary | ICD-10-CM | POA: Diagnosis not present

## 2012-01-23 DIAGNOSIS — S0100XA Unspecified open wound of scalp, initial encounter: Secondary | ICD-10-CM | POA: Diagnosis not present

## 2012-01-23 DIAGNOSIS — S0990XA Unspecified injury of head, initial encounter: Secondary | ICD-10-CM | POA: Diagnosis not present

## 2012-01-23 DIAGNOSIS — Z888 Allergy status to other drugs, medicaments and biological substances status: Secondary | ICD-10-CM | POA: Diagnosis not present

## 2012-01-23 DIAGNOSIS — Z79899 Other long term (current) drug therapy: Secondary | ICD-10-CM | POA: Diagnosis not present

## 2012-01-23 DIAGNOSIS — Z882 Allergy status to sulfonamides status: Secondary | ICD-10-CM | POA: Diagnosis not present

## 2012-02-06 DIAGNOSIS — E291 Testicular hypofunction: Secondary | ICD-10-CM | POA: Diagnosis not present

## 2012-02-06 DIAGNOSIS — R3129 Other microscopic hematuria: Secondary | ICD-10-CM | POA: Diagnosis not present

## 2012-02-07 DIAGNOSIS — E291 Testicular hypofunction: Secondary | ICD-10-CM | POA: Diagnosis not present

## 2012-02-07 DIAGNOSIS — R39198 Other difficulties with micturition: Secondary | ICD-10-CM | POA: Diagnosis not present

## 2012-02-07 DIAGNOSIS — N400 Enlarged prostate without lower urinary tract symptoms: Secondary | ICD-10-CM | POA: Diagnosis not present

## 2012-03-02 DIAGNOSIS — R05 Cough: Secondary | ICD-10-CM | POA: Diagnosis not present

## 2012-03-02 DIAGNOSIS — J3489 Other specified disorders of nose and nasal sinuses: Secondary | ICD-10-CM | POA: Diagnosis not present

## 2012-03-02 DIAGNOSIS — J0100 Acute maxillary sinusitis, unspecified: Secondary | ICD-10-CM | POA: Diagnosis not present

## 2012-03-02 DIAGNOSIS — R059 Cough, unspecified: Secondary | ICD-10-CM | POA: Diagnosis not present

## 2012-03-26 DIAGNOSIS — L719 Rosacea, unspecified: Secondary | ICD-10-CM | POA: Diagnosis not present

## 2012-04-07 DIAGNOSIS — E291 Testicular hypofunction: Secondary | ICD-10-CM | POA: Diagnosis not present

## 2012-04-14 DIAGNOSIS — R351 Nocturia: Secondary | ICD-10-CM | POA: Diagnosis not present

## 2012-04-14 DIAGNOSIS — E291 Testicular hypofunction: Secondary | ICD-10-CM | POA: Diagnosis not present

## 2012-05-12 DIAGNOSIS — L723 Sebaceous cyst: Secondary | ICD-10-CM | POA: Diagnosis not present

## 2012-05-12 DIAGNOSIS — L570 Actinic keratosis: Secondary | ICD-10-CM | POA: Diagnosis not present

## 2012-05-12 DIAGNOSIS — D485 Neoplasm of uncertain behavior of skin: Secondary | ICD-10-CM | POA: Diagnosis not present

## 2012-05-12 DIAGNOSIS — C44320 Squamous cell carcinoma of skin of unspecified parts of face: Secondary | ICD-10-CM | POA: Diagnosis not present

## 2012-05-12 DIAGNOSIS — D1801 Hemangioma of skin and subcutaneous tissue: Secondary | ICD-10-CM | POA: Diagnosis not present

## 2012-05-12 DIAGNOSIS — L819 Disorder of pigmentation, unspecified: Secondary | ICD-10-CM | POA: Diagnosis not present

## 2012-05-28 DIAGNOSIS — C44320 Squamous cell carcinoma of skin of unspecified parts of face: Secondary | ICD-10-CM | POA: Diagnosis not present

## 2012-07-29 DIAGNOSIS — Z125 Encounter for screening for malignant neoplasm of prostate: Secondary | ICD-10-CM | POA: Diagnosis not present

## 2012-07-29 DIAGNOSIS — E7800 Pure hypercholesterolemia, unspecified: Secondary | ICD-10-CM | POA: Diagnosis not present

## 2012-07-29 DIAGNOSIS — Z0000 Encounter for general adult medical examination without abnormal findings: Secondary | ICD-10-CM | POA: Diagnosis not present

## 2012-07-29 DIAGNOSIS — I10 Essential (primary) hypertension: Secondary | ICD-10-CM | POA: Diagnosis not present

## 2012-08-04 DIAGNOSIS — J309 Allergic rhinitis, unspecified: Secondary | ICD-10-CM | POA: Diagnosis not present

## 2012-08-04 DIAGNOSIS — E291 Testicular hypofunction: Secondary | ICD-10-CM | POA: Diagnosis not present

## 2012-08-04 DIAGNOSIS — E7800 Pure hypercholesterolemia, unspecified: Secondary | ICD-10-CM | POA: Diagnosis not present

## 2012-08-04 DIAGNOSIS — R079 Chest pain, unspecified: Secondary | ICD-10-CM | POA: Diagnosis not present

## 2012-08-04 DIAGNOSIS — I2510 Atherosclerotic heart disease of native coronary artery without angina pectoris: Secondary | ICD-10-CM | POA: Diagnosis not present

## 2012-08-08 DIAGNOSIS — E291 Testicular hypofunction: Secondary | ICD-10-CM | POA: Diagnosis not present

## 2012-08-08 DIAGNOSIS — R972 Elevated prostate specific antigen [PSA]: Secondary | ICD-10-CM | POA: Diagnosis not present

## 2012-09-03 DIAGNOSIS — L819 Disorder of pigmentation, unspecified: Secondary | ICD-10-CM | POA: Diagnosis not present

## 2012-09-03 DIAGNOSIS — L723 Sebaceous cyst: Secondary | ICD-10-CM | POA: Diagnosis not present

## 2012-09-03 DIAGNOSIS — D234 Other benign neoplasm of skin of scalp and neck: Secondary | ICD-10-CM | POA: Diagnosis not present

## 2012-09-03 DIAGNOSIS — L570 Actinic keratosis: Secondary | ICD-10-CM | POA: Diagnosis not present

## 2012-09-03 DIAGNOSIS — Z85828 Personal history of other malignant neoplasm of skin: Secondary | ICD-10-CM | POA: Diagnosis not present

## 2012-09-03 DIAGNOSIS — L820 Inflamed seborrheic keratosis: Secondary | ICD-10-CM | POA: Diagnosis not present

## 2012-09-03 DIAGNOSIS — L821 Other seborrheic keratosis: Secondary | ICD-10-CM | POA: Diagnosis not present

## 2012-09-03 DIAGNOSIS — D1801 Hemangioma of skin and subcutaneous tissue: Secondary | ICD-10-CM | POA: Diagnosis not present

## 2012-09-05 ENCOUNTER — Other Ambulatory Visit: Payer: Self-pay | Admitting: Cardiology

## 2012-09-05 NOTE — Telephone Encounter (Signed)
Rx was sent to pharmacy electronically. 

## 2012-09-09 DIAGNOSIS — R972 Elevated prostate specific antigen [PSA]: Secondary | ICD-10-CM | POA: Diagnosis not present

## 2012-09-16 DIAGNOSIS — R972 Elevated prostate specific antigen [PSA]: Secondary | ICD-10-CM | POA: Diagnosis not present

## 2012-10-06 DIAGNOSIS — I2510 Atherosclerotic heart disease of native coronary artery without angina pectoris: Secondary | ICD-10-CM | POA: Diagnosis not present

## 2012-10-06 DIAGNOSIS — I10 Essential (primary) hypertension: Secondary | ICD-10-CM | POA: Diagnosis not present

## 2012-10-06 DIAGNOSIS — B029 Zoster without complications: Secondary | ICD-10-CM | POA: Diagnosis not present

## 2012-10-06 DIAGNOSIS — Z23 Encounter for immunization: Secondary | ICD-10-CM | POA: Diagnosis not present

## 2012-10-06 DIAGNOSIS — Z006 Encounter for examination for normal comparison and control in clinical research program: Secondary | ICD-10-CM | POA: Diagnosis not present

## 2012-10-06 DIAGNOSIS — E7800 Pure hypercholesterolemia, unspecified: Secondary | ICD-10-CM | POA: Diagnosis not present

## 2012-10-14 ENCOUNTER — Other Ambulatory Visit: Payer: Self-pay | Admitting: Dermatology

## 2012-10-14 DIAGNOSIS — Z85828 Personal history of other malignant neoplasm of skin: Secondary | ICD-10-CM | POA: Diagnosis not present

## 2012-10-14 DIAGNOSIS — L723 Sebaceous cyst: Secondary | ICD-10-CM | POA: Diagnosis not present

## 2012-10-28 DIAGNOSIS — J069 Acute upper respiratory infection, unspecified: Secondary | ICD-10-CM | POA: Diagnosis not present

## 2012-12-16 DIAGNOSIS — J387 Other diseases of larynx: Secondary | ICD-10-CM | POA: Diagnosis not present

## 2012-12-16 DIAGNOSIS — L299 Pruritus, unspecified: Secondary | ICD-10-CM | POA: Diagnosis not present

## 2013-01-13 DIAGNOSIS — M25569 Pain in unspecified knee: Secondary | ICD-10-CM | POA: Diagnosis not present

## 2013-01-16 ENCOUNTER — Ambulatory Visit: Payer: Medicare Other | Admitting: Cardiology

## 2013-01-19 ENCOUNTER — Encounter: Payer: Self-pay | Admitting: Cardiology

## 2013-01-19 ENCOUNTER — Telehealth: Payer: Self-pay | Admitting: *Deleted

## 2013-01-19 ENCOUNTER — Ambulatory Visit (INDEPENDENT_AMBULATORY_CARE_PROVIDER_SITE_OTHER): Payer: Medicare Other | Admitting: Cardiology

## 2013-01-19 VITALS — BP 140/90 | HR 57 | Ht 70.0 in | Wt 174.0 lb

## 2013-01-19 DIAGNOSIS — I2510 Atherosclerotic heart disease of native coronary artery without angina pectoris: Secondary | ICD-10-CM

## 2013-01-19 DIAGNOSIS — I10 Essential (primary) hypertension: Secondary | ICD-10-CM

## 2013-01-19 DIAGNOSIS — Z951 Presence of aortocoronary bypass graft: Secondary | ICD-10-CM

## 2013-01-19 DIAGNOSIS — E785 Hyperlipidemia, unspecified: Secondary | ICD-10-CM

## 2013-01-19 NOTE — Telephone Encounter (Signed)
PER PATIENT REQUEST -FAXED TODAY'S EKG FORM 01/19/13

## 2013-01-19 NOTE — Patient Instructions (Addendum)
  No changes --continue with current medication.   Your physician wants you to follow-up in 12 months DR Harding-30 mins appt.  You will receive a reminder letter in the mail two months in advance. If you don't receive a letter, please call our office to schedule the follow-up appointment.

## 2013-01-20 DIAGNOSIS — M25569 Pain in unspecified knee: Secondary | ICD-10-CM | POA: Diagnosis not present

## 2013-01-21 ENCOUNTER — Encounter: Payer: Self-pay | Admitting: Cardiology

## 2013-01-21 DIAGNOSIS — E785 Hyperlipidemia, unspecified: Secondary | ICD-10-CM | POA: Insufficient documentation

## 2013-01-21 NOTE — Progress Notes (Signed)
PATIENT: [name] MRN: [id number]  DOB: [birth year]   DOV:01/21/2013 PCP: Horatio Pel, MD  Clinic Note: Chief Complaint  Patient presents with  . Annual Exam    NO CHEST PAIN ,NO SOB , NO EDEMA   HPI: [name] is a 68 y.o.  male with a PMH below who presents today for annual followup. As you recall is a very pleasant gentleman who I first met back in 2000. He had an Exercise Myoview Stress Test that did not have any scintigraphic evidence of ischemia, but has significant ECG changes with diffuse ST depressions in the inferior leads with ST elevations in septal leads. After quite a discussion, he agreed to go for cardiac catheterization by Dr. Rex Kras that revealed severe left main and LAD disease. He went subsequently for 4 vessel CABG. He did well with cardiac rehabilitation initially and then got back to his routine exercise. He was trouble somewhat labile blood pressure during cardiac rehabilitation, this finally gotten stabilized. In December 2012, he started having some more atypical type discomfort symptoms, and was referred for relook Myoview partially to look for graft patency and also to evaluate his symptoms. This again was read as negative for ischemia with images, as well as ECG findings.  Interval History: He presents today, relatively stable from a cardiac standpoint. He does note that he is not noted to his routine cannot exercise because of the injury he suffered twisting his left knee he is due to see the surgeons. He is a thin) MRI to evaluate for a meniscal tear. He also has recently stopped his testosterone replacement because he had a mild increase his PSA levels. This cough some fever because his brother had recently been diagnosed prostate cancer. Otherwise he had been having some GERD symptoms which is now improved after he is back it was coffee and just his diet. Otherwise cardiac standpoint, he is stable, his lipids were checked by Dr. Shelia Media and he was told  the values looked good.  Cardiovascular ROS: no chest pain or dyspnea on exertion positive for - Occasional mild orthostatic symptoms. Much less frequent and he was previously having. negative for - edema, irregular heartbeat, loss of consciousness, murmur, orthopnea, palpitations, paroxysmal nocturnal dyspnea, rapid heart rate, shortness of breath or No cramping or myalgias on Crestor. No melena, hematochezia or hematuria. No lightheadedness, dizziness, wooziness. No TIA or amaurosis fugax symptoms.. No claudication  Past Medical History  Diagnosis Date  . CAD in native artery August 2012    Cardiac catheterization for abnormal treadmill portion of Myoview stress test -with diffuse market ST depressions in the inferior leads with ST elevations in V1 and V2; no ischemia or infarction on images --> 70% distal left main, 90% ostial LAD with 60-70% lesions in proximal. Minimal RCA or circumflex disease. Subtotal proximal rPDA: Referred for CABG  . 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  [name] Hypertension     Labile  . Dyslipidemia - low HDL     Lipids monitored by Dr. Shelia Media.  . Low testosterone     Recently stopped replacement therapy.    Prior Cardiac Evaluation and Past Surgical History: Past Surgical History  Procedure Laterality Date  . Transthoracic echocardiogram  07/27/2010    EF >55%, mild concentric LVH, stage 1 diastolic dysfunction  . Cardiac catheterization  08/18/2010    Terminal left main 70%, ostial LAD 90%, sequential 60-70% lesion in the proximal LAD, mild circumflex  lesions, but large OM1 and OM2. RCA had moderate irregularities and a subtotal proximal PDA  . Coronary artery bypass graft  08/29/2010    x4. LIMA to LAD, SVG to diag, SVG to OM, SVG to PDA. Rt thigh vein harvest  . Pre cabg doppler  08/25/2010    No significant extracranial carotid artery stenosis. ECA stenosis noted bilaterally - ICA/CCA ratio id 0.9-rt and 0.74-lft  . Cardiovascular  stress test  02/06/2011    EKG negative for ischemia. No significant wall motion abnormalities noted.    Allergies  Allergen Reactions  . Cephalosporins Palpitations  . Triamterene-Hctz Palpitations  . Statins Other (See Comments)    " elevated muscle enzymes"  . Zetia [Ezetimibe]     Increased muscle enzymes  . Lisinopril Cough  . Sulfa Antibiotics Rash    Current Outpatient Prescriptions  Medication Sig Dispense Refill  . acetaminophen (TYLENOL) 325 MG tablet Take 650 mg by mouth. Take 2  Daily as needed      . aspirin 81 MG tablet Take 81 mg by mouth daily.      . Azelaic Acid (FINACEA) 15 % cream Apply topically as needed. After skin is thoroughly washed and patted dry, gently but thoroughly massage a thin film of azelaic acid cream into the affected area twice daily, in the morning and evening.       . Calcium Carbonate-Vitamin D (CALCIUM 600+D) 600-400 MG-UNIT per tablet Take 1 tablet by mouth daily.        . cetirizine (ZYRTEC) 10 MG tablet Take 10 mg by mouth daily.      . Coenzyme Q10 (CO Q-10) 100 MG CAPS Take 1 tablet by mouth 1 day or 1 dose.        [name] CRESTOR 5 MG tablet Take 5 mg by mouth daily.       . Cyanocobalamin (B-12) 2500 MCG TABS Take by mouth 1 day or 1 dose.        [name] doxycycline (VIBRAMYCIN) 100 MG capsule Take 100 mg by mouth daily as needed.        . metoprolol tartrate (LOPRESSOR) 25 MG tablet Take 1 tablet every morning and 1/2 tablet every evening.  135 tablet  1  . pantoprazole (PROTONIX) 40 MG tablet Take 40 mg by mouth daily.        . Pediatric Multiple Vit-C-FA (PEDIATRIC MULTIVITAMIN) chewable tablet Chew 1 tablet by mouth daily.      . traMADol (ULTRAM) 50 MG tablet TAKE 1 TABLET EVERY 4 HOURS AS NEEDED  40 tablet  0  . valsartan-hydrochlorothiazide (DIOVAN-HCT) 320-12.5 MG per tablet Take 1/2 tablet once a day      . vardenafil (LEVITRA) 10 MG tablet 10 mg as needed.         No current facility-administered medications for this visit.    History    Social History Narrative   He is a married father of 2.   He does not smoke or drink alcohol.   Up until recently when he hurt his left knee, he was continuing to exercise vigorously at least 2-3 times a week sometimes up to 2 hours of time doing cardiovascular and muscle strength exercises.   ROS: A comprehensive Review of Systems - Negative except Symptoms noted in history of present illness. Notable noncardiac symptoms are the left knee pain and swelling, and GERD that has improved.  PHYSICAL EXAM BP 140/90  Pulse 57  Ht _0  (1.778 m)  Wt 174 lb (78.926  kg)  BMI 24.97 kg/m2 General: He is a very pleasant, healthy/ very fit appearing gentleman; appears younger than stated age. He is alert and oriented x3, answers questions appropriately. He is in normal spirits with a normal mood and affect HEENT: NCAT, EOMI, MMM. Anicteric sclerae.  Neck: Supple, no LAN, JVD or carotid bruit.  Heart: RRR, normal S1, S2, no M/R/G. Nondisplaced PMI. Well-healed sternotomy and no crepitation noted.  Lungs: CTAB, nonlabored, normal effort, good air movement.  Abdomen: Soft/NT/ND/NABS. No HSM.  Extremities: No C/C/E, 2+ and equal pulses throughout.   GEE:ATVVLRTJW today: Yes Rate: 57 , Rhythm: Sinus bradycardia, possible left atrial enlargement, borderline complete right bundle branch block; no significant changes  Recent Labs: Checked by PCP, do not have at present  ASSESSMENT / PLAN: CAD in native artery; Left Main and LAD and occluded RPDA; referred for CABG Doing very well no active symptoms. On aspirin, Crestor, low-dose beta blocker that he is taking 25 mg in the a.m. and 12.5 mg in the p.m. as well as low-dose ARB/HCTZ combination. Myoview just over a year ago was negative for ischemia with no ECG changes.  S/P CABG x 4 Has recovered well from her surgery. No adverse effects from surgery.  Hypertension - labile His blood pressure is up a little today, but with his tendencies of being up  and down in a cyclical manner, inclined to just continue his current regimen. He has had orthostatic symptoms in the past with higher medication doses. Therefore I would continue low-dose beta blocker and ARB/HCTZ combination.  Dyslipidemia - low HDL Reportedly well controlled on current dose of Crestor, and monitored by PCP. He is taking 2.5 mg of Crestor a day and tolerating it well. The higher doses were more difficult to tolerate.    No orders of the defined types were placed in this encounter.     Followup: One year  DAVID W. Ellyn Hack, M.D., M.S. [facility] [address] [place], [place]  [address]  [phone] Pager # [phone]

## 2013-01-21 NOTE — Assessment & Plan Note (Signed)
Has recovered well from her surgery. No adverse effects from surgery.

## 2013-01-21 NOTE — Assessment & Plan Note (Signed)
Doing very well no active symptoms. On aspirin, Crestor, low-dose beta blocker that he is taking 25 mg in the a.m. and 12.5 mg in the p.m. as well as low-dose ARB/HCTZ combination. Myoview just over a year ago was negative for ischemia with no ECG changes.

## 2013-01-21 NOTE — Assessment & Plan Note (Signed)
His blood pressure is up a little today, but with his tendencies of being up and down in a cyclical manner, inclined to just continue his current regimen. He has had orthostatic symptoms in the past with higher medication doses. Therefore I would continue low-dose beta blocker and ARB/HCTZ combination.

## 2013-01-21 NOTE — Assessment & Plan Note (Addendum)
Reportedly well controlled on current dose of Crestor, and monitored by PCP. He is taking 2.5 mg of Crestor a day and tolerating it well. The higher doses were more difficult to tolerate.

## 2013-01-26 ENCOUNTER — Telehealth: Payer: Self-pay | Admitting: Cardiology

## 2013-01-26 NOTE — Telephone Encounter (Signed)
Please call at this number [phone] can speak to his wife .[name] [name]

## 2013-01-26 NOTE — Telephone Encounter (Signed)
Message forwarded to Dr. Ellyn Hack to advise if pt can stop ASA.

## 2013-01-26 NOTE — Telephone Encounter (Signed)
Have some questions about some stomach problems that started a while ago . He thinks its the aspirin regiment ,he takes 81mg  per day ..his question is can he come completely off of the aspirin regiment .[name] Please Call   Thanks

## 2013-01-27 NOTE — Telephone Encounter (Signed)
Try taking ASA with food.   If he can't tolerate it.  I guess stop it.  It is prophylactic.    Leonie Man, MD

## 2013-01-27 NOTE — Telephone Encounter (Signed)
Returned call and pt verified x 2 w/ pt's wife, Malachy Mood.  Informed message received and informed per Dr. Ellyn Hack.  Stated pt is taking after he eats and it still upsets his stomach.  Verbalized understanding and agreed w/ plan.  Pt will stop ASA.

## 2013-01-29 ENCOUNTER — Telehealth: Payer: Self-pay | Admitting: *Deleted

## 2013-01-29 DIAGNOSIS — M25569 Pain in unspecified knee: Secondary | ICD-10-CM | POA: Diagnosis not present

## 2013-01-29 NOTE — Telephone Encounter (Signed)
Returned call and pt verified x 2 w/ Malachy Mood, pt's wife.  Informed message received and as soon as clearance form received, Dr. Ellyn Hack will complete and have Ivin Booty, RN fax back.   Verbalized understanding.

## 2013-01-29 NOTE — Telephone Encounter (Signed)
Pt stopped in from Dr. Gladstone Lighter appointment and they stated that he needs to have Dr. Ellyn Hack say that it is ok to have knee surgery. Pt was told that they wanted to do the surgery next week, Dr. Gladstone Lighter stated that they will contact our office for the notes but they also were told to come over to see if it can be done sooner.

## 2013-02-02 ENCOUNTER — Encounter: Payer: Self-pay | Admitting: Cardiology

## 2013-02-02 ENCOUNTER — Telehealth: Payer: Self-pay | Admitting: *Deleted

## 2013-02-02 NOTE — Telephone Encounter (Signed)
FAXED CARDIAC CLEARANCE AND REQUESTED EKG,MYOVIEW,LETTER SPOKE TO WENDY CATON

## 2013-02-02 NOTE — Telephone Encounter (Signed)
NOTIFIED WIFE THAT CARDIAC CLEARANCE WAS SENT TO DR GIOFFRE'S OFFICE - SPOKE TO WENDY CATON  AT DR GIOFFRE'S OFFICE.

## 2013-02-11 DIAGNOSIS — M23305 Other meniscus derangements, unspecified medial meniscus, unspecified knee: Secondary | ICD-10-CM | POA: Diagnosis not present

## 2013-02-11 DIAGNOSIS — M1710 Unilateral primary osteoarthritis, unspecified knee: Secondary | ICD-10-CM | POA: Diagnosis not present

## 2013-02-11 DIAGNOSIS — M23329 Other meniscus derangements, posterior horn of medial meniscus, unspecified knee: Secondary | ICD-10-CM | POA: Diagnosis not present

## 2013-02-18 ENCOUNTER — Other Ambulatory Visit: Payer: Self-pay | Admitting: Orthopedic Surgery

## 2013-02-18 ENCOUNTER — Ambulatory Visit
Admission: RE | Admit: 2013-02-18 | Discharge: 2013-02-18 | Disposition: A | Discharge status: Home / Self Care | Payer: Medicare Other | Source: Ambulatory Visit | Attending: Orthopedic Surgery | Admitting: Orthopedic Surgery

## 2013-02-18 DIAGNOSIS — R609 Edema, unspecified: Secondary | ICD-10-CM

## 2013-02-18 DIAGNOSIS — M7989 Other specified soft tissue disorders: Secondary | ICD-10-CM | POA: Diagnosis not present

## 2013-02-18 DIAGNOSIS — M79609 Pain in unspecified limb: Secondary | ICD-10-CM | POA: Diagnosis not present

## 2013-02-18 DIAGNOSIS — R52 Pain, unspecified: Secondary | ICD-10-CM

## 2013-02-26 ENCOUNTER — Ambulatory Visit: Payer: Medicare Other | Attending: Orthopedic Surgery | Admitting: Physical Therapy

## 2013-02-26 ENCOUNTER — Other Ambulatory Visit: Payer: Self-pay | Admitting: Cardiology

## 2013-02-26 DIAGNOSIS — M6281 Muscle weakness (generalized): Secondary | ICD-10-CM | POA: Diagnosis not present

## 2013-02-26 DIAGNOSIS — R609 Edema, unspecified: Secondary | ICD-10-CM | POA: Diagnosis not present

## 2013-02-26 DIAGNOSIS — M25669 Stiffness of unspecified knee, not elsewhere classified: Secondary | ICD-10-CM | POA: Insufficient documentation

## 2013-02-26 DIAGNOSIS — IMO0001 Reserved for inherently not codable concepts without codable children: Secondary | ICD-10-CM | POA: Insufficient documentation

## 2013-02-26 DIAGNOSIS — M25569 Pain in unspecified knee: Secondary | ICD-10-CM | POA: Diagnosis not present

## 2013-02-27 NOTE — Telephone Encounter (Signed)
Rx was sent to pharmacy electronically. 

## 2013-03-04 ENCOUNTER — Ambulatory Visit: Payer: Medicare Other | Admitting: Rehabilitation

## 2013-03-04 DIAGNOSIS — R609 Edema, unspecified: Secondary | ICD-10-CM | POA: Diagnosis not present

## 2013-03-04 DIAGNOSIS — M25569 Pain in unspecified knee: Secondary | ICD-10-CM | POA: Diagnosis not present

## 2013-03-04 DIAGNOSIS — M6281 Muscle weakness (generalized): Secondary | ICD-10-CM | POA: Diagnosis not present

## 2013-03-04 DIAGNOSIS — R972 Elevated prostate specific antigen [PSA]: Secondary | ICD-10-CM | POA: Diagnosis not present

## 2013-03-04 DIAGNOSIS — IMO0001 Reserved for inherently not codable concepts without codable children: Secondary | ICD-10-CM | POA: Diagnosis not present

## 2013-03-04 DIAGNOSIS — M25669 Stiffness of unspecified knee, not elsewhere classified: Secondary | ICD-10-CM | POA: Diagnosis not present

## 2013-03-10 ENCOUNTER — Ambulatory Visit: Payer: Medicare Other | Attending: Orthopedic Surgery | Admitting: Physical Therapy

## 2013-03-10 DIAGNOSIS — R609 Edema, unspecified: Secondary | ICD-10-CM | POA: Diagnosis not present

## 2013-03-10 DIAGNOSIS — M25569 Pain in unspecified knee: Secondary | ICD-10-CM | POA: Diagnosis not present

## 2013-03-10 DIAGNOSIS — M25669 Stiffness of unspecified knee, not elsewhere classified: Secondary | ICD-10-CM | POA: Insufficient documentation

## 2013-03-10 DIAGNOSIS — IMO0001 Reserved for inherently not codable concepts without codable children: Secondary | ICD-10-CM | POA: Diagnosis not present

## 2013-03-10 DIAGNOSIS — M6281 Muscle weakness (generalized): Secondary | ICD-10-CM | POA: Diagnosis not present

## 2013-03-12 ENCOUNTER — Ambulatory Visit: Payer: Medicare Other | Admitting: Rehabilitation

## 2013-03-12 DIAGNOSIS — IMO0001 Reserved for inherently not codable concepts without codable children: Secondary | ICD-10-CM | POA: Diagnosis not present

## 2013-03-12 DIAGNOSIS — M6281 Muscle weakness (generalized): Secondary | ICD-10-CM | POA: Diagnosis not present

## 2013-03-12 DIAGNOSIS — M25669 Stiffness of unspecified knee, not elsewhere classified: Secondary | ICD-10-CM | POA: Diagnosis not present

## 2013-03-12 DIAGNOSIS — M25569 Pain in unspecified knee: Secondary | ICD-10-CM | POA: Diagnosis not present

## 2013-03-12 DIAGNOSIS — R609 Edema, unspecified: Secondary | ICD-10-CM | POA: Diagnosis not present

## 2013-03-13 DIAGNOSIS — N529 Male erectile dysfunction, unspecified: Secondary | ICD-10-CM | POA: Diagnosis not present

## 2013-03-13 DIAGNOSIS — R972 Elevated prostate specific antigen [PSA]: Secondary | ICD-10-CM | POA: Diagnosis not present

## 2013-03-13 DIAGNOSIS — E291 Testicular hypofunction: Secondary | ICD-10-CM | POA: Diagnosis not present

## 2013-03-17 ENCOUNTER — Ambulatory Visit: Payer: Medicare Other | Admitting: Rehabilitation

## 2013-03-17 DIAGNOSIS — IMO0001 Reserved for inherently not codable concepts without codable children: Secondary | ICD-10-CM | POA: Diagnosis not present

## 2013-03-17 DIAGNOSIS — M6281 Muscle weakness (generalized): Secondary | ICD-10-CM | POA: Diagnosis not present

## 2013-03-17 DIAGNOSIS — R609 Edema, unspecified: Secondary | ICD-10-CM | POA: Diagnosis not present

## 2013-03-17 DIAGNOSIS — M25569 Pain in unspecified knee: Secondary | ICD-10-CM | POA: Diagnosis not present

## 2013-03-17 DIAGNOSIS — M25669 Stiffness of unspecified knee, not elsewhere classified: Secondary | ICD-10-CM | POA: Diagnosis not present

## 2013-03-19 ENCOUNTER — Ambulatory Visit: Payer: Medicare Other | Admitting: Rehabilitation

## 2013-03-19 DIAGNOSIS — M6281 Muscle weakness (generalized): Secondary | ICD-10-CM | POA: Diagnosis not present

## 2013-03-19 DIAGNOSIS — IMO0001 Reserved for inherently not codable concepts without codable children: Secondary | ICD-10-CM | POA: Diagnosis not present

## 2013-03-19 DIAGNOSIS — M25569 Pain in unspecified knee: Secondary | ICD-10-CM | POA: Diagnosis not present

## 2013-03-19 DIAGNOSIS — R609 Edema, unspecified: Secondary | ICD-10-CM | POA: Diagnosis not present

## 2013-03-19 DIAGNOSIS — M25669 Stiffness of unspecified knee, not elsewhere classified: Secondary | ICD-10-CM | POA: Diagnosis not present

## 2013-03-24 ENCOUNTER — Encounter: Payer: Medicare Other | Admitting: Physical Therapy

## 2013-04-28 ENCOUNTER — Other Ambulatory Visit: Payer: Self-pay | Admitting: Cardiology

## 2013-05-04 NOTE — Telephone Encounter (Signed)
Rx was sent to pharmacy electronically. 

## 2013-05-05 DIAGNOSIS — M23305 Other meniscus derangements, unspecified medial meniscus, unspecified knee: Secondary | ICD-10-CM | POA: Diagnosis not present

## 2013-06-16 DIAGNOSIS — M1710 Unilateral primary osteoarthritis, unspecified knee: Secondary | ICD-10-CM | POA: Diagnosis not present

## 2013-06-16 DIAGNOSIS — Z4789 Encounter for other orthopedic aftercare: Secondary | ICD-10-CM | POA: Diagnosis not present

## 2013-07-07 ENCOUNTER — Other Ambulatory Visit: Payer: Self-pay | Admitting: Dermatology

## 2013-07-07 DIAGNOSIS — Z85828 Personal history of other malignant neoplasm of skin: Secondary | ICD-10-CM | POA: Diagnosis not present

## 2013-07-07 DIAGNOSIS — L821 Other seborrheic keratosis: Secondary | ICD-10-CM | POA: Diagnosis not present

## 2013-07-07 DIAGNOSIS — L570 Actinic keratosis: Secondary | ICD-10-CM | POA: Diagnosis not present

## 2013-07-07 DIAGNOSIS — C44320 Squamous cell carcinoma of skin of unspecified parts of face: Secondary | ICD-10-CM | POA: Diagnosis not present

## 2013-07-09 DIAGNOSIS — M1710 Unilateral primary osteoarthritis, unspecified knee: Secondary | ICD-10-CM | POA: Diagnosis not present

## 2013-07-15 DIAGNOSIS — C44320 Squamous cell carcinoma of skin of unspecified parts of face: Secondary | ICD-10-CM | POA: Diagnosis not present

## 2013-07-15 DIAGNOSIS — Z85828 Personal history of other malignant neoplasm of skin: Secondary | ICD-10-CM | POA: Diagnosis not present

## 2013-07-16 DIAGNOSIS — M1710 Unilateral primary osteoarthritis, unspecified knee: Secondary | ICD-10-CM | POA: Diagnosis not present

## 2013-07-22 DIAGNOSIS — L570 Actinic keratosis: Secondary | ICD-10-CM | POA: Diagnosis not present

## 2013-07-22 DIAGNOSIS — Z09 Encounter for follow-up examination after completed treatment for conditions other than malignant neoplasm: Secondary | ICD-10-CM | POA: Diagnosis not present

## 2013-07-23 DIAGNOSIS — M1710 Unilateral primary osteoarthritis, unspecified knee: Secondary | ICD-10-CM | POA: Diagnosis not present

## 2013-08-17 DIAGNOSIS — E7800 Pure hypercholesterolemia, unspecified: Secondary | ICD-10-CM | POA: Diagnosis not present

## 2013-08-17 DIAGNOSIS — K219 Gastro-esophageal reflux disease without esophagitis: Secondary | ICD-10-CM | POA: Diagnosis not present

## 2013-08-17 DIAGNOSIS — I10 Essential (primary) hypertension: Secondary | ICD-10-CM | POA: Diagnosis not present

## 2013-08-20 DIAGNOSIS — I2510 Atherosclerotic heart disease of native coronary artery without angina pectoris: Secondary | ICD-10-CM | POA: Diagnosis not present

## 2013-08-20 DIAGNOSIS — M25569 Pain in unspecified knee: Secondary | ICD-10-CM | POA: Diagnosis not present

## 2013-08-20 DIAGNOSIS — J309 Allergic rhinitis, unspecified: Secondary | ICD-10-CM | POA: Diagnosis not present

## 2013-08-20 DIAGNOSIS — E7800 Pure hypercholesterolemia, unspecified: Secondary | ICD-10-CM | POA: Diagnosis not present

## 2013-09-03 DIAGNOSIS — M1710 Unilateral primary osteoarthritis, unspecified knee: Secondary | ICD-10-CM | POA: Diagnosis not present

## 2013-09-04 DIAGNOSIS — L819 Disorder of pigmentation, unspecified: Secondary | ICD-10-CM | POA: Diagnosis not present

## 2013-09-04 DIAGNOSIS — L719 Rosacea, unspecified: Secondary | ICD-10-CM | POA: Diagnosis not present

## 2013-09-04 DIAGNOSIS — Z85828 Personal history of other malignant neoplasm of skin: Secondary | ICD-10-CM | POA: Diagnosis not present

## 2013-09-04 DIAGNOSIS — L821 Other seborrheic keratosis: Secondary | ICD-10-CM | POA: Diagnosis not present

## 2013-09-04 DIAGNOSIS — L570 Actinic keratosis: Secondary | ICD-10-CM | POA: Diagnosis not present

## 2013-09-04 DIAGNOSIS — D1801 Hemangioma of skin and subcutaneous tissue: Secondary | ICD-10-CM | POA: Diagnosis not present

## 2013-09-16 DIAGNOSIS — R972 Elevated prostate specific antigen [PSA]: Secondary | ICD-10-CM | POA: Diagnosis not present

## 2013-09-23 DIAGNOSIS — N529 Male erectile dysfunction, unspecified: Secondary | ICD-10-CM | POA: Diagnosis not present

## 2013-09-23 DIAGNOSIS — E291 Testicular hypofunction: Secondary | ICD-10-CM | POA: Diagnosis not present

## 2013-09-23 DIAGNOSIS — R972 Elevated prostate specific antigen [PSA]: Secondary | ICD-10-CM | POA: Diagnosis not present

## 2013-10-15 DIAGNOSIS — M1712 Unilateral primary osteoarthritis, left knee: Secondary | ICD-10-CM | POA: Diagnosis not present

## 2013-10-16 DIAGNOSIS — Z23 Encounter for immunization: Secondary | ICD-10-CM | POA: Diagnosis not present

## 2013-10-29 DIAGNOSIS — E291 Testicular hypofunction: Secondary | ICD-10-CM | POA: Diagnosis not present

## 2013-11-19 DIAGNOSIS — K642 Third degree hemorrhoids: Secondary | ICD-10-CM | POA: Diagnosis not present

## 2013-11-19 DIAGNOSIS — Z1211 Encounter for screening for malignant neoplasm of colon: Secondary | ICD-10-CM | POA: Diagnosis not present

## 2013-12-02 DIAGNOSIS — K642 Third degree hemorrhoids: Secondary | ICD-10-CM | POA: Diagnosis not present

## 2013-12-04 ENCOUNTER — Other Ambulatory Visit: Payer: Self-pay | Admitting: Cardiology

## 2013-12-23 DIAGNOSIS — K642 Third degree hemorrhoids: Secondary | ICD-10-CM | POA: Diagnosis not present

## 2014-01-03 DIAGNOSIS — Z79891 Long term (current) use of opiate analgesic: Secondary | ICD-10-CM | POA: Diagnosis not present

## 2014-01-03 DIAGNOSIS — Z7982 Long term (current) use of aspirin: Secondary | ICD-10-CM | POA: Diagnosis not present

## 2014-01-03 DIAGNOSIS — R42 Dizziness and giddiness: Secondary | ICD-10-CM | POA: Diagnosis not present

## 2014-01-03 DIAGNOSIS — Z882 Allergy status to sulfonamides status: Secondary | ICD-10-CM | POA: Diagnosis not present

## 2014-01-03 DIAGNOSIS — Z79899 Other long term (current) drug therapy: Secondary | ICD-10-CM | POA: Diagnosis not present

## 2014-01-03 DIAGNOSIS — F17291 Nicotine dependence, other tobacco product, in remission: Secondary | ICD-10-CM | POA: Diagnosis not present

## 2014-01-03 DIAGNOSIS — Z888 Allergy status to other drugs, medicaments and biological substances status: Secondary | ICD-10-CM | POA: Diagnosis not present

## 2014-01-13 DIAGNOSIS — K641 Second degree hemorrhoids: Secondary | ICD-10-CM | POA: Diagnosis not present

## 2014-01-19 DIAGNOSIS — Z85828 Personal history of other malignant neoplasm of skin: Secondary | ICD-10-CM | POA: Diagnosis not present

## 2014-01-19 DIAGNOSIS — L570 Actinic keratosis: Secondary | ICD-10-CM | POA: Diagnosis not present

## 2014-01-19 DIAGNOSIS — L738 Other specified follicular disorders: Secondary | ICD-10-CM | POA: Diagnosis not present

## 2014-01-28 ENCOUNTER — Telehealth: Payer: Self-pay | Admitting: Cardiology

## 2014-01-28 NOTE — Telephone Encounter (Signed)
Close encounter 

## 2014-01-29 ENCOUNTER — Ambulatory Visit: Payer: Medicare Other | Admitting: Cardiology

## 2014-03-16 ENCOUNTER — Ambulatory Visit (INDEPENDENT_AMBULATORY_CARE_PROVIDER_SITE_OTHER): Payer: Medicare Other | Admitting: Cardiology

## 2014-03-16 ENCOUNTER — Encounter: Payer: Self-pay | Admitting: Cardiology

## 2014-03-16 VITALS — BP 120/60 | HR 63 | Ht 70.0 in | Wt 179.9 lb

## 2014-03-16 DIAGNOSIS — I2510 Atherosclerotic heart disease of native coronary artery without angina pectoris: Secondary | ICD-10-CM | POA: Diagnosis not present

## 2014-03-16 DIAGNOSIS — E785 Hyperlipidemia, unspecified: Secondary | ICD-10-CM

## 2014-03-16 DIAGNOSIS — Z951 Presence of aortocoronary bypass graft: Secondary | ICD-10-CM

## 2014-03-16 DIAGNOSIS — M791 Myalgia, unspecified site: Secondary | ICD-10-CM

## 2014-03-16 DIAGNOSIS — I10 Essential (primary) hypertension: Secondary | ICD-10-CM

## 2014-03-16 DIAGNOSIS — R9431 Abnormal electrocardiogram [ECG] [EKG]: Secondary | ICD-10-CM | POA: Diagnosis not present

## 2014-03-16 MED ORDER — ROSUVASTATIN CALCIUM 10 MG PO TABS: ORAL_TABLET | ORAL | Status: DC

## 2014-03-16 NOTE — Assessment & Plan Note (Signed)
Has a history of problems with statins in the past. We will check CPK levels arm when he goes to get his PSA level checked at the urologist next week.

## 2014-03-16 NOTE — Progress Notes (Signed)
PCP: Horatio Pel, MD  Clinic Note: Chief Complaint  Patient presents with  . Follow-up    1 year, chest tightness on occasion but states it is due to CABG,   . Coronary Artery Disease    History of CABG    HPI: [name] is a 69 y.o. male with a PMH below who presents today for Annual followup is multivessel CAD status post CABG. He initially had an exercise the Myoview stress test that was negative scintigraphically for ischemia, no significant ST depressions on EKG. After a long discussion he agreed to undergo cardiac catheterization which revealed multivessel CAD and he subsequently underwent four-vessel CABG. He has atypical chest discomfort symptoms and was evaluated in December 2012 and a Myoview that had a normal EKG as well as normal images. He has had somewhat labile hypertension but has been relatively well-controlled of late.  Past Medical History  Diagnosis Date  . CAD in native artery August 2012    Cardiac catheterization for abnormal treadmill portion of Myoview stress test -with diffuse market ST depressions in the inferior leads with ST elevations in V1 and V2; no ischemia or infarction on images --> 70% distal left main, 90% ostial LAD with 60-70% lesions in proximal. Minimal RCA or circumflex disease. Subtotal proximal rPDA: Referred for CABG  . 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  [name] Hypertension     Labile  . Dyslipidemia - low HDL     Lipids monitored by Dr. Shelia Media.  . Low testosterone     Recently stopped replacement therapy.    Prior Cardiac Evaluation and Past Surgical History: Past Surgical History  Procedure Laterality Date  . Transthoracic echocardiogram  07/27/2010    EF >55%, mild concentric LVH, stage 1 diastolic dysfunction  . Cardiac catheterization  08/18/2010    Terminal left main 70%, ostial LAD 90%, sequential 60-70% lesion in the proximal LAD, mild circumflex lesions, but large OM1 and OM2. RCA had  moderate irregularities and a subtotal proximal PDA  . Coronary artery bypass graft  08/29/2010    x4. LIMA to LAD, SVG to diag, SVG to OM, SVG to PDA. Rt thigh vein harvest  . Pre cabg doppler  08/25/2010    No significant extracranial carotid artery stenosis. ECA stenosis noted bilaterally - ICA/CCA ratio id 0.9-rt and 0.74-lft  . Cardiovascular stress test  02/06/2011    EKG negative for ischemia. No significant wall motion abnormalities noted.    Interval History: he presents today still having some strange numbness sensation he has post CABG discomfort. He does not have any significant chest tightness or pressure however with rest or exertion. He had more just a white count of 9 and is relieved by crossing his arm across his chest exam of his sternal wires. He still exercising but not as much he had been. He had been dealing with significant episode of vertigo a few weeks ago, and really has not been exercising until cleared up. He has issues with fatigue in the past and been off of his testosterone replacement.he is now back on low-dose replacement and is feeling much better with better energy. Cardiac standpoint otherwise he denies any anginal symptoms with this exertion no dyspnea with exertion. No heart failure symptoms of PND, orthopnea or edema. No palpitations, lightheadedness, dizziness, weakness or syncope/near syncope. No TIA/amaurosis fugax symptoms. No melena, hematochezia, hematuria, or epstaxis. No claudication.  ROS: A comprehensive was performed. Review of  Systems  Constitutional: Negative for malaise/fatigue.  HENT: Negative for nosebleeds.   Respiratory: Negative for cough and shortness of breath.   Cardiovascular: Negative.        Per history of present illness  Gastrointestinal: Negative for blood in stool and melena.  Genitourinary: Negative for hematuria.  Musculoskeletal: Positive for myalgias (He has some cramping in his biceps and thigh muscles after exercising. He  is concerned about possible myositis from statins.). Negative for joint pain.  Neurological: Positive for dizziness (Vertigo with nausea). Negative for sensory change, speech change and loss of consciousness.  Endo/Heme/Allergies: Bruises/bleeds easily.  Psychiatric/Behavioral: Negative.   All other systems reviewed and are negative.   Current Outpatient Prescriptions on File Prior to Visit  Medication Sig Dispense Refill  . acetaminophen (TYLENOL) 325 MG tablet Take 650 mg by mouth. Take 2  Daily as needed    . aspirin 81 MG tablet Take 81 mg by mouth daily.    . Azelaic Acid (FINACEA) 15 % cream Apply topically as needed. After skin is thoroughly washed and patted dry, gently but thoroughly massage a thin film of azelaic acid cream into the affected area twice daily, in the morning and evening.     . Calcium Carbonate-Vitamin D (CALCIUM 600+D) 600-400 MG-UNIT per tablet Take 1 tablet by mouth daily.      . cetirizine (ZYRTEC) 10 MG tablet Take 10 mg by mouth daily.    . Coenzyme Q10 (CO Q-10) 100 MG CAPS Take 1 tablet by mouth 1 day or 1 dose.      . Cyanocobalamin (B-12) 2500 MCG TABS Take by mouth 1 day or 1 dose.      [name] doxycycline (VIBRAMYCIN) 100 MG capsule Take 100 mg by mouth daily as needed.      . metoprolol tartrate (LOPRESSOR) 25 MG tablet TAKE 1 TABLET EVERY MORNING AND ONE-HALF (1/2) TABLET EVERY EVENING 135 tablet 1  . pantoprazole (PROTONIX) 40 MG tablet Take 40 mg by mouth daily.      . Pediatric Multiple Vit-C-FA (PEDIATRIC MULTIVITAMIN) chewable tablet Chew 1 tablet by mouth daily.    . traMADol (ULTRAM) 50 MG tablet TAKE 1 TABLET EVERY 4 HOURS AS NEEDED 40 tablet 0  . valsartan-hydrochlorothiazide (DIOVAN-HCT) 320-12.5 MG per tablet Take 1/2 tablet once a day    . vardenafil (LEVITRA) 10 MG tablet 10 mg as needed.       No current facility-administered medications on file prior to visit.   Allergies  Allergen Reactions  . Cephalosporins Palpitations  .  Triamterene-Hctz Palpitations  . Statins Other (See Comments)    " elevated muscle enzymes"  . Zetia [Ezetimibe]     Increased muscle enzymes  . Lisinopril Cough  . Sulfa Antibiotics Rash   History  Substance Use Topics  . Smoking status: Never Smoker   . Smokeless tobacco: Former Systems developer    Types: Chew    Quit date: 01/09/2007  . Alcohol Use: No   Family History  Problem Relation Age of Onset  . Cancer Father     RENAL CA -DIED AGE 7O  . Heart failure Brother     ? heart dz.  . Heart disease Brother   . Heart disease Maternal Grandfather     Wt Readings from Last 3 Encounters:  03/16/14 179 lb 14.4 oz (81.602 kg)  01/19/13 174 lb (78.926 kg)  10/09/10 165 lb 3.2 oz (74.934 kg)  14 pound weight gain in 4 years from less vigorous exercise  PHYSICAL EXAM  BP 120/60 mmHg  Pulse 63  Ht 5\' 10"  (1.778 m)  Wt 179 lb 14.4 oz (81.602 kg)  BMI 25.81 kg/m2 General: He is a very pleasant, healthy/ fit appearing gentleman; appears younger than stated age. He is alert and oriented x3, answers questions appropriately. He is in normal spirits with a normal mood and affect HEENT: NCAT, EOMI, MMM. Anicteric sclerae.  Neck: Supple, no LAN, JVD or carotid bruit.  Heart: RRR, normal S1, S2, no M/R/G. Nondisplaced PMI. Well-healed sternotomy and no crepitation noted.  Lungs: CTAB, nonlabored, normal effort, good air movement.  Abdomen: Soft/NT/ND/NABS. No HSM.  Extremities: No C/C/E, 2+ and equal pulses throughout.  Psych: pleasant mood & affect; Neuro: Grossly intact    Adult ECG Report - performed & read  Rate: 63 ;  Rhythm: normal sinus rhythm, changes with mild T wave inversions. Subtle differences as compared to previous EKGs, nonspecific.  Narrative Interpretation: subtle changes in EKG but not significant  Recent Labs:   None since 08/2013 labs from PCP, results reviewed & labs scanned.  Na+ 140, K+ 4.6, Cl- 97, HCO3- 32 , BUN 18, Cr  1.3, Glu 88, Ca2+ 9.2; AST 25, ALT 52, AlkP 66,  Alb 3.9, TP 6.6, T Bili 0.7  TC 160, TG 152, HDL 41, LDL 89   ASSESSMENT / PLAN: CAD in native artery; Left Main and LAD and occluded RPDA; referred for CABG Stable with no active anginal symptoms. He has some mild post CABG chest wall discomfort, is not associated with exertion. He is quite active and doing otherwise relatively well.  Plan: Continue aspirin, low-dose blocker as written, low-dose ARB/HCTZ   Hypertension - labile Blood pressure was actually excellent today.  Continue current regimen.   Coronary artery disease involving native coronary artery of native heart without angina pectoris See above   Dyslipidemia - low HDL His labs and followed by his primary physician, Dr. Shelia Media.  Should be having labs checked in the fall.  He is on statin, and had been tolerating the Crestor at 5 mg quite well we'll initiate coenzyme Q 10. We will check CPK levels to evaluate his myalgias.   Myalgia Has a history of problems with statins in the past. We will check CPK levels arm when he goes to get his PSA level checked at the urologist next week.   S/P CABG x 4 He still has some mild chest wall discomfort from sternal wires. He also has a numbness along the LIMA extraction site.  He has been reassured on physical exam this was clearly from postop changes and tends to just adjust his position accordingly.     Orders Placed This Encounter  Procedures  . CK (Creatine Kinase)  . EKG 12-Lead   Meds ordered this encounter  Medications  . sildenafil (VIAGRA) 50 MG tablet    Sig: Take 20 mg by mouth.  . testosterone cypionate (DEPOTESTOTERONE CYPIONATE) 100 MG/ML injection    Sig: Inject 100 mg into the muscle every 14 (fourteen) days.  . Fish Oil OIL    Sig: Take 2,500 mg by mouth 2 (two) times daily.  . rosuvastatin (CRESTOR) 10 MG tablet    Sig: Take 1 tablet (10 mg total) by mouth daily as directed.    Dispense:  90 tablet    Refill:  1    Followup: 1  yr   Jaxie Racanelli, Leonie Green, M.D., M.S. Interventional Cardiologist   Pager # [phone]

## 2014-03-16 NOTE — Assessment & Plan Note (Signed)
He still has some mild chest wall discomfort from sternal wires. He also has a numbness along the LIMA extraction site.  He has been reassured on physical exam this was clearly from postop changes and tends to just adjust his position accordingly.

## 2014-03-16 NOTE — Assessment & Plan Note (Signed)
Blood pressure was actually excellent today.  Continue current regimen.

## 2014-03-16 NOTE — Assessment & Plan Note (Signed)
See above

## 2014-03-16 NOTE — Patient Instructions (Signed)
Dr Ellyn Hack has ordered the following test(s) to be done: 1. Blood work - to be done at the hospital next week  Dr Ellyn Hack wants you to follow-up in 1 year.  You will receive a reminder letter in the mail one months in advance. If you don't receive a letter, please call our office to schedule the follow-up appointment.

## 2014-03-16 NOTE — Assessment & Plan Note (Signed)
His labs and followed by his primary physician, Dr. Shelia Media.  Should be having labs checked in the fall.  He is on statin, and had been tolerating the Crestor at 5 mg quite well we'll initiate coenzyme Q 10. We will check CPK levels to evaluate his myalgias.

## 2014-03-16 NOTE — Assessment & Plan Note (Signed)
Stable with no active anginal symptoms. He has some mild post CABG chest wall discomfort, is not associated with exertion. He is quite active and doing otherwise relatively well.  Plan: Continue aspirin, low-dose blocker as written, low-dose ARB/HCTZ

## 2014-03-17 ENCOUNTER — Encounter: Payer: Self-pay | Admitting: Cardiology

## 2014-03-24 DIAGNOSIS — M791 Myalgia: Secondary | ICD-10-CM | POA: Diagnosis not present

## 2014-03-24 DIAGNOSIS — E291 Testicular hypofunction: Secondary | ICD-10-CM | POA: Diagnosis not present

## 2014-03-24 LAB — CK: CK TOTAL: 167 U/L (ref 7–232)

## 2014-03-26 ENCOUNTER — Telehealth: Payer: Self-pay | Admitting: *Deleted

## 2014-03-26 NOTE — Telephone Encounter (Signed)
-----   Message from Leonie Man, MD sent at 03/25/2014  8:03 AM EDT ----- Normal CK levels - no evidence of muscle damages  [facility]

## 2014-03-26 NOTE — Telephone Encounter (Signed)
Spoke to patient. Result given . Verbalized understanding  

## 2014-03-31 DIAGNOSIS — R972 Elevated prostate specific antigen [PSA]: Secondary | ICD-10-CM | POA: Diagnosis not present

## 2014-03-31 DIAGNOSIS — E291 Testicular hypofunction: Secondary | ICD-10-CM | POA: Diagnosis not present

## 2014-03-31 DIAGNOSIS — N5201 Erectile dysfunction due to arterial insufficiency: Secondary | ICD-10-CM | POA: Diagnosis not present

## 2014-05-27 ENCOUNTER — Other Ambulatory Visit: Payer: Self-pay | Admitting: Cardiology

## 2014-05-27 NOTE — Telephone Encounter (Signed)
Rx has been sent to the pharmacy electronically. ° °

## 2014-06-11 DIAGNOSIS — E291 Testicular hypofunction: Secondary | ICD-10-CM | POA: Diagnosis not present

## 2014-08-18 DIAGNOSIS — N451 Epididymitis: Secondary | ICD-10-CM | POA: Diagnosis not present

## 2014-09-07 DIAGNOSIS — L821 Other seborrheic keratosis: Secondary | ICD-10-CM | POA: Diagnosis not present

## 2014-09-07 DIAGNOSIS — L570 Actinic keratosis: Secondary | ICD-10-CM | POA: Diagnosis not present

## 2014-09-07 DIAGNOSIS — L218 Other seborrheic dermatitis: Secondary | ICD-10-CM | POA: Diagnosis not present

## 2014-09-07 DIAGNOSIS — D1801 Hemangioma of skin and subcutaneous tissue: Secondary | ICD-10-CM | POA: Diagnosis not present

## 2014-09-07 DIAGNOSIS — D485 Neoplasm of uncertain behavior of skin: Secondary | ICD-10-CM | POA: Diagnosis not present

## 2014-09-07 DIAGNOSIS — Z85828 Personal history of other malignant neoplasm of skin: Secondary | ICD-10-CM | POA: Diagnosis not present

## 2014-09-07 DIAGNOSIS — L438 Other lichen planus: Secondary | ICD-10-CM | POA: Diagnosis not present

## 2014-09-24 DIAGNOSIS — I10 Essential (primary) hypertension: Secondary | ICD-10-CM | POA: Diagnosis not present

## 2014-09-24 DIAGNOSIS — Z125 Encounter for screening for malignant neoplasm of prostate: Secondary | ICD-10-CM | POA: Diagnosis not present

## 2014-09-24 DIAGNOSIS — E780 Pure hypercholesterolemia: Secondary | ICD-10-CM | POA: Diagnosis not present

## 2014-09-29 DIAGNOSIS — J387 Other diseases of larynx: Secondary | ICD-10-CM | POA: Diagnosis not present

## 2014-09-29 DIAGNOSIS — I10 Essential (primary) hypertension: Secondary | ICD-10-CM | POA: Diagnosis not present

## 2014-09-29 DIAGNOSIS — E780 Pure hypercholesterolemia: Secondary | ICD-10-CM | POA: Diagnosis not present

## 2014-09-29 DIAGNOSIS — Z8042 Family history of malignant neoplasm of prostate: Secondary | ICD-10-CM | POA: Diagnosis not present

## 2014-10-04 DIAGNOSIS — Z23 Encounter for immunization: Secondary | ICD-10-CM | POA: Diagnosis not present

## 2014-10-20 DIAGNOSIS — R972 Elevated prostate specific antigen [PSA]: Secondary | ICD-10-CM | POA: Diagnosis not present

## 2014-10-20 DIAGNOSIS — N5201 Erectile dysfunction due to arterial insufficiency: Secondary | ICD-10-CM | POA: Diagnosis not present

## 2014-10-20 DIAGNOSIS — E291 Testicular hypofunction: Secondary | ICD-10-CM | POA: Diagnosis not present

## 2014-10-27 DIAGNOSIS — R972 Elevated prostate specific antigen [PSA]: Secondary | ICD-10-CM | POA: Diagnosis not present

## 2014-10-27 DIAGNOSIS — N5201 Erectile dysfunction due to arterial insufficiency: Secondary | ICD-10-CM | POA: Diagnosis not present

## 2014-10-27 DIAGNOSIS — N451 Epididymitis: Secondary | ICD-10-CM | POA: Diagnosis not present

## 2014-10-27 DIAGNOSIS — E291 Testicular hypofunction: Secondary | ICD-10-CM | POA: Diagnosis not present

## 2014-10-29 DIAGNOSIS — H2513 Age-related nuclear cataract, bilateral: Secondary | ICD-10-CM | POA: Diagnosis not present

## 2014-10-29 DIAGNOSIS — I10 Essential (primary) hypertension: Secondary | ICD-10-CM | POA: Diagnosis not present

## 2014-10-29 DIAGNOSIS — C44129 Squamous cell carcinoma of skin of left eyelid, including canthus: Secondary | ICD-10-CM | POA: Diagnosis not present

## 2014-11-04 DIAGNOSIS — D0439 Carcinoma in situ of skin of other parts of face: Secondary | ICD-10-CM | POA: Diagnosis not present

## 2014-11-04 DIAGNOSIS — Z85828 Personal history of other malignant neoplasm of skin: Secondary | ICD-10-CM | POA: Diagnosis not present

## 2014-11-04 DIAGNOSIS — D485 Neoplasm of uncertain behavior of skin: Secondary | ICD-10-CM | POA: Diagnosis not present

## 2014-11-04 DIAGNOSIS — L570 Actinic keratosis: Secondary | ICD-10-CM | POA: Diagnosis not present

## 2014-12-14 DIAGNOSIS — M952 Other acquired deformity of head: Secondary | ICD-10-CM | POA: Diagnosis not present

## 2014-12-14 DIAGNOSIS — D485 Neoplasm of uncertain behavior of skin: Secondary | ICD-10-CM | POA: Diagnosis not present

## 2014-12-14 DIAGNOSIS — Z9889 Other specified postprocedural states: Secondary | ICD-10-CM | POA: Diagnosis not present

## 2014-12-14 DIAGNOSIS — C44129 Squamous cell carcinoma of skin of left eyelid, including canthus: Secondary | ICD-10-CM | POA: Diagnosis not present

## 2014-12-14 DIAGNOSIS — L570 Actinic keratosis: Secondary | ICD-10-CM | POA: Diagnosis not present

## 2014-12-14 DIAGNOSIS — Z85828 Personal history of other malignant neoplasm of skin: Secondary | ICD-10-CM | POA: Diagnosis not present

## 2015-02-08 DIAGNOSIS — L570 Actinic keratosis: Secondary | ICD-10-CM | POA: Diagnosis not present

## 2015-03-09 ENCOUNTER — Telehealth: Payer: Self-pay | Admitting: Cardiology

## 2015-03-09 DIAGNOSIS — I2510 Atherosclerotic heart disease of native coronary artery without angina pectoris: Secondary | ICD-10-CM

## 2015-03-09 NOTE — Telephone Encounter (Signed)
That would be a good idea since we are delaying f/u.  [place]

## 2015-03-09 NOTE — Telephone Encounter (Addendum)
Pt called as he rescheduled his appt for June 19th as he is going to be out of town. Pt remembers during last OV last year Dr. Ellyn Hack mentioning he should get repeat exercise myoview this year. Pt wants to know if he can do this before his appt. Told pt would confirm with Dr. Ellyn Hack and get back with him.  If orders approved, pt okay to move current appt to afternoon of 6/20 and have myoview completed that morning before appt.  Will forward to Dr. Ellyn Hack for review and approval/denial of testing. Pt verbalized understanding, no dditional questions at this time  Call wife cell (emergec[employer] contact) with appt info as she keeps up with this information

## 2015-03-09 NOTE — Telephone Encounter (Signed)
New message  Pt called req a call back to determine if he will need a stress test. Pt made follow Up appt in June.  Please call

## 2015-03-15 ENCOUNTER — Other Ambulatory Visit: Payer: Self-pay | Admitting: Cardiology

## 2015-03-16 NOTE — Telephone Encounter (Signed)
Information sent to scheduler Arrangement completed

## 2015-03-25 DIAGNOSIS — Z7982 Long term (current) use of aspirin: Secondary | ICD-10-CM | POA: Diagnosis not present

## 2015-03-29 DIAGNOSIS — I10 Essential (primary) hypertension: Secondary | ICD-10-CM | POA: Diagnosis not present

## 2015-03-29 DIAGNOSIS — K219 Gastro-esophageal reflux disease without esophagitis: Secondary | ICD-10-CM | POA: Diagnosis not present

## 2015-03-29 DIAGNOSIS — E7800 Pure hypercholesterolemia, unspecified: Secondary | ICD-10-CM | POA: Diagnosis not present

## 2015-04-21 DIAGNOSIS — R972 Elevated prostate specific antigen [PSA]: Secondary | ICD-10-CM | POA: Diagnosis not present

## 2015-04-21 DIAGNOSIS — E291 Testicular hypofunction: Secondary | ICD-10-CM | POA: Diagnosis not present

## 2015-06-23 ENCOUNTER — Telehealth (HOSPITAL_COMMUNITY): Payer: Self-pay

## 2015-06-23 DIAGNOSIS — R110 Nausea: Secondary | ICD-10-CM | POA: Diagnosis not present

## 2015-06-23 DIAGNOSIS — R109 Unspecified abdominal pain: Secondary | ICD-10-CM | POA: Diagnosis not present

## 2015-06-23 DIAGNOSIS — R197 Diarrhea, unspecified: Secondary | ICD-10-CM | POA: Diagnosis not present

## 2015-06-23 NOTE — Telephone Encounter (Signed)
Encounter complete. 

## 2015-06-24 ENCOUNTER — Other Ambulatory Visit: Payer: Self-pay | Admitting: Internal Medicine

## 2015-06-24 DIAGNOSIS — R109 Unspecified abdominal pain: Secondary | ICD-10-CM

## 2015-06-24 DIAGNOSIS — R972 Elevated prostate specific antigen [PSA]: Secondary | ICD-10-CM | POA: Diagnosis not present

## 2015-06-24 DIAGNOSIS — R110 Nausea: Secondary | ICD-10-CM

## 2015-06-24 DIAGNOSIS — R197 Diarrhea, unspecified: Secondary | ICD-10-CM

## 2015-06-27 ENCOUNTER — Ambulatory Visit: Payer: No Typology Code available for payment source | Admitting: Cardiology

## 2015-06-27 HISTORY — PX: NM MYOVIEW LTD: HXRAD82

## 2015-06-28 ENCOUNTER — Ambulatory Visit (HOSPITAL_COMMUNITY)
Admission: RE | Admit: 2015-06-28 | Discharge: 2015-06-28 | Disposition: A | Discharge status: Home / Self Care | Payer: Medicare Other | Source: Ambulatory Visit | Attending: Cardiology | Admitting: Cardiology

## 2015-06-28 ENCOUNTER — Encounter (HOSPITAL_COMMUNITY): Payer: Self-pay | Admitting: *Deleted

## 2015-06-28 ENCOUNTER — Ambulatory Visit (INDEPENDENT_AMBULATORY_CARE_PROVIDER_SITE_OTHER): Payer: Medicare Other | Admitting: Cardiology

## 2015-06-28 ENCOUNTER — Encounter: Payer: Self-pay | Admitting: Cardiology

## 2015-06-28 VITALS — BP 146/82 | HR 69 | Ht 70.0 in | Wt 178.2 lb

## 2015-06-28 DIAGNOSIS — I2510 Atherosclerotic heart disease of native coronary artery without angina pectoris: Secondary | ICD-10-CM | POA: Insufficient documentation

## 2015-06-28 DIAGNOSIS — Z8249 Family history of ischemic heart disease and other diseases of the circulatory system: Secondary | ICD-10-CM | POA: Insufficient documentation

## 2015-06-28 DIAGNOSIS — R42 Dizziness and giddiness: Secondary | ICD-10-CM | POA: Insufficient documentation

## 2015-06-28 DIAGNOSIS — E785 Hyperlipidemia, unspecified: Secondary | ICD-10-CM

## 2015-06-28 DIAGNOSIS — E784 Other hyperlipidemia: Secondary | ICD-10-CM | POA: Diagnosis not present

## 2015-06-28 DIAGNOSIS — I10 Essential (primary) hypertension: Secondary | ICD-10-CM | POA: Diagnosis not present

## 2015-06-28 DIAGNOSIS — R079 Chest pain, unspecified: Secondary | ICD-10-CM | POA: Diagnosis not present

## 2015-06-28 LAB — MYOCARDIAL PERFUSION IMAGING
CHL CUP MPHR: 150 {beats}/min
CHL CUP NUCLEAR SDS: 0
CHL CUP NUCLEAR SRS: 0
CHL CUP NUCLEAR SSS: 0
CHL RATE OF PERCEIVED EXERTION: 17
CSEPEW: 10.4 METS
CSEPHR: 98 %
Exercise duration (min): 8 min
Exercise duration (sec): 31 s
LV sys vol: 29 mL
LVDIAVOL: 72 mL (ref 62–150)
Peak HR: 148 {beats}/min
Rest HR: 73 {beats}/min
TID: 0.93

## 2015-06-28 MED ORDER — TECHNETIUM TC 99M TETROFOSMIN IV KIT
10.8000 | PACK | Freq: Once | INTRAVENOUS | Status: AC | PRN
  Administered 2015-06-28: 10.8 via INTRAVENOUS
  Filled 2015-06-28: qty 11

## 2015-06-28 MED ORDER — TECHNETIUM TC 99M TETROFOSMIN IV KIT
29.4000 | PACK | Freq: Once | INTRAVENOUS | Status: AC | PRN
  Administered 2015-06-28: 29.4 via INTRAVENOUS
  Filled 2015-06-28: qty 29

## 2015-06-28 NOTE — Patient Instructions (Signed)
NO CHANGES IN CURRENT MEDICATIONS   Your physician wants you to follow-up in:12 MONTHS WITH DR Hershal Coria You will receive a reminder letter in the mail two months in advance. If you don't receive a letter, please call our office to schedule the follow-up appointment.   If you need a refill on your cardiac medications before your next appointment, please call your pharmacy.

## 2015-06-28 NOTE — Progress Notes (Signed)
Patient instructed to take BP meds upon discharge to home. Pt stated he had his meds with him and took them prior to his stress images.Delight Hoh A

## 2015-06-28 NOTE — Progress Notes (Signed)
PCP: Horatio Pel, MD  Clinic Note: Chief Complaint  Patient presents with  . Follow-up    vertigo, no other Sx.  . Coronary Artery Disease    Status post CABG. Had follow-up Myoview today    HPI: [name] is a 70 y.o. male with a PMH below who presents today for delayed one-year follow-up visit for CAD-CABG. He was actually relatively asymptomatic treadmill stress test back in August 2012 that showed diffuse ST depressions with V1 V2 ST elevations. He had a heart catheterization demonstrated as expected left main disease and was referred for CABG.  [name] was last seen March 2016.  Just got back from [place]: None  Studies Reviewed:  * Myoview today: EF 60%. Normal study. No ischemia or infarction.  Interval History: [name] presents today for routine follow-up and is doing quite well. He has no problems with energy. He still has some mild discomfort at the sternal wire sites the lower sternum with bending over. Otherwise really no chest symptoms whatsoever. He has not had any resting or exertional chest tightness/ pressure or dyspnea. He is exercising, albeit not as frequently as he would like. Major complaint is feeling now is abdominal fullness and bloating and doesn't seem to be fluid just seems to be more gas related. He denies any PND, orthopnea or edema. No palpitations, lightheadedness, dizziness, weakness or syncope/near syncope. No TIA/amaurosis fugax symptoms. No melena, hematochezia, hematuria, or epstaxis. No claudication.  ROS: A comprehensive was performed. Review of Systems  Constitutional: Negative for malaise/fatigue.  HENT: Negative for nosebleeds.   Respiratory: Negative for cough, shortness of breath and wheezing.   Cardiovascular:       Per history of present illness  Gastrointestinal: Positive for heartburn, abdominal pain (With bloating; noted some right upper quadrant/flank discomfort last night that is  resolved.) and constipation.  Genitourinary: Negative for hematuria.       Urology is following his hemoglobin levels. There is concern that there may be a side effect of testosterone injections. They're reducing the dosage.  Musculoskeletal: Negative for myalgias, joint pain and falls.  Neurological: Negative for dizziness, loss of consciousness and headaches.  Psychiatric/Behavioral: Negative for depression and memory loss. The patient is nervous/anxious. The patient does not have insomnia.      Past Medical History  Diagnosis Date  . CAD in native artery August 2012    Cardiac catheterization for abnormal treadmill portion of Myoview stress test -with diffuse market ST depressions in the inferior leads with ST elevations in V1 and V2; no ischemia or infarction on images --> 70% distal left main, 90% ostial LAD with 60-70% lesions in proximal. Minimal RCA or circumflex disease. Subtotal proximal rPDA: Referred for CABG  . 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  [name] Hypertension     Labile  . Dyslipidemia - low HDL     Lipids monitored by Dr. Shelia Media.  . Low testosterone     Recently stopped replacement therapy.    Past Surgical History  Procedure Laterality Date  . Transthoracic echocardiogram  07/27/2010    EF >55%, mild concentric LVH, stage 1 diastolic dysfunction  . Cardiac catheterization  08/18/2010    Terminal left main 70%, ostial LAD 90%, sequential 60-70% lesion in the proximal LAD, mild circumflex lesions, but large OM1 and OM2. RCA had moderate irregularities and a subtotal proximal PDA  . Coronary artery bypass graft  08/29/2010  x4. LIMA to LAD, SVG to diag, SVG to OM, SVG to PDA. Rt thigh vein harvest  . Pre cabg doppler  08/25/2010    No significant extracranial carotid artery stenosis. ECA stenosis noted bilaterally - ICA/CCA ratio id 0.9-rt and 0.74-lft  . Cardiovascular stress test  02/06/2011    EKG negative for ischemia. No significant wall  motion abnormalities noted.  [name] Nm myoview ltd  06/27/2015    EF 60%. No ischemia or infarction. NORMAL STUDY.    Prior to Admission medications   Medication Sig Start Date End Date Taking? Authorizing Provider  acetaminophen (TYLENOL) 325 MG tablet Take 650 mg by mouth. Take 2  Daily as needed   Yes Historical Provider, MD  aspirin 81 MG tablet Take 81 mg by mouth daily.   Yes Historical Provider, MD  Azelaic Acid (FINACEA) 15 % cream Apply topically as needed. After skin is thoroughly washed and patted dry, gently but thoroughly massage a thin film of azelaic acid cream into the affected area twice daily, in the morning and evening.    Yes Historical Provider, MD  Calcium Carbonate-Vitamin D (CALCIUM 600+D) 600-400 MG-UNIT per tablet Take 1 tablet by mouth daily.     Yes Historical Provider, MD  cetirizine (ZYRTEC) 10 MG tablet Take 10 mg by mouth daily.   Yes Historical Provider, MD  Coenzyme Q10 (CO Q-10) 100 MG CAPS Take 1 tablet by mouth 1 day or 1 dose.     Yes Historical Provider, MD  doxycycline (VIBRAMYCIN) 100 MG capsule Take 100 mg by mouth daily as needed.     Yes Historical Provider, MD  Fish Oil OIL Take 2,500 mg by mouth 2 (two) times daily.   Yes Historical Provider, MD  metoprolol tartrate (LOPRESSOR) 25 MG tablet TAKE 1 TABLET EVERY MORNING AND TAKE ONE-HALF (1/2) TABLET EVERY EVENING 03/15/15  Yes Leonie Man, MD  pantoprazole (PROTONIX) 40 MG tablet Take 40 mg by mouth daily.     Yes Historical Provider, MD  Pediatric Multiple Vit-C-FA (PEDIATRIC MULTIVITAMIN) chewable tablet Chew 1 tablet by mouth daily.   Yes Historical Provider, MD  rosuvastatin (CRESTOR) 10 MG tablet Take 1 tablet (10 mg total) by mouth daily as directed. 03/16/14  Yes Leonie Man, MD  sildenafil (VIAGRA) 50 MG tablet Take 20 mg by mouth.   Yes Historical Provider, MD  testosterone cypionate (DEPOTESTOTERONE CYPIONATE) 100 MG/ML injection Inject 100 mg into the muscle every 14 (fourteen) days.   Yes  Historical Provider, MD  traMADol (ULTRAM) 50 MG tablet TAKE 1 TABLET EVERY 4 HOURS AS NEEDED 07/05/11  Yes Wayne E Gold, PA-C  valsartan-hydrochlorothiazide (DIOVAN-HCT) 320-12.5 MG per tablet Take 1/2 tablet once a day 10/17/12  Yes Historical Provider, MD  vardenafil (LEVITRA) 10 MG tablet 10 mg as needed.     Yes Historical Provider, MD   Allergies  Allergen Reactions  . Cephalosporins Palpitations  . Triamterene-Hctz Palpitations  . Dexamethasone   . Statins Other (See Comments)    " elevated muscle enzymes"  . Zetia [Ezetimibe]     Increased muscle enzymes  . Lisinopril Cough  . Sulfa Antibiotics Rash     Social History   Social History  . Marital Status: Married    Spouse Name: N/A  . Number of Children: N/A  . Years of Education: N/A   Social History Main Topics  . Smoking status: Never Smoker   . Smokeless tobacco: Former Systems developer    Types: Risk analyst  date: 01/09/2007  . Alcohol Use: No  . Drug Use: None  . Sexual Activity: Not Asked   Other Topics Concern  . None   Social History Narrative   He is a married father of 2.   He does not smoke or drink alcohol.   Up until recently when he hurt his left knee, he was continuing to exercise vigorously at least 2-3 times a week sometimes up to 2 hours of time doing cardiovascular and muscle strength exercises.   Family History  Problem Relation Age of Onset  . Cancer Father     RENAL CA -DIED AGE 7O  . Heart failure Brother     ? heart dz.  . Heart disease Brother   . Heart disease Maternal Grandfather     Wt Readings from Last 3 Encounters:  06/28/15 178 lb 3.2 oz (80.831 kg)  06/28/15 179 lb (81.194 kg)  03/16/14 179 lb 14.4 oz (81.602 kg)    PHYSICAL EXAM BP 146/82 mmHg  Pulse 69  Ht 5\' 10"  (1.778 m)  Wt 178 lb 3.2 oz (80.831 kg)  BMI 25.57 kg/m2 General appearance: alert, cooperative, appears stated age, no distress and healthy-appearing. Well-nourished and well-groomed.  Neck: no adenopathy, no  carotid bruit and no JVD Lungs: clear to auscultation bilaterally, normal percussion bilaterally and non-labored Heart: regular rate and rhythm, S1 and S2 normal, no murmur, click, rub or gallop nondisplaced PMI Abdomen: soft, non-tender; bowel sounds normal; no masses,  no organomegaly; it does seem to be some tympanitic sound on percussion. He does feel a little bit bloated but nondistended. Extremities: extremities normal, atraumatic, no cyanosis, and edema  Pulses: 2+ and symmetric;  Skin: mobility and turgor normal, no evidence of bleeding or bruising and no lesions noted  Neurologic: Mental status: Alert, oriented, thought content appropriate Cranial nerves: normal (II-XII grossly intact)    Adult ECG Report Not checked   Other studies Reviewed: Additional studies/ records that were reviewed today include:  Recent Labs:  - Dr. Shelia Media checked Lipids in Dec   ASSESSMENT / PLAN: Problem List Items Addressed This Visit    Hypertension - labile (Chronic)    Blood pressure looks okay today. Continue current regimen.      Dyslipidemia (high LDL; low HDL) (Chronic)    His labs are being followed by his PCP. Last check was in December. He seems to be tolerating 5 mg Crestor without too much the way of myalgias. He is on coenzyme Q 10 which seems to be helping. Goal LDL is less than 70 and HDL greater than 40.      CAD in native artery; Left Main and LAD and occluded RPDA; referred for CABG - Primary (Chronic)    Stable. No active anginal symptoms. Mild post CABG chest wall pain from sternal wires. No exertional pain. Remains active and fit. Not as active as maybe he would like to be, but still doing well. Negative Myoview today. Normal EF. Remains on his work he dosing of Lopressor which took a long time to determine the appropriate dosing. He is on ARB as well as Crestor. He is taking 5 mg Crestor long with coenzyme Q 10.         Current medicines are reviewed at length with  the patient today. (+/- concerns) None The following changes have been made: None Studies Ordered:   No orders of the defined types were placed in this encounter.    ROV 1 yr   [name],  M.D., M.S. Interventional Cardiologist   Pager # [phone] Phone # [phone] [address] [place], [place] [address]

## 2015-06-30 ENCOUNTER — Ambulatory Visit
Admission: RE | Admit: 2015-06-30 | Discharge: 2015-06-30 | Disposition: A | Discharge status: Home / Self Care | Payer: Medicare Other | Source: Ambulatory Visit | Attending: Internal Medicine | Admitting: Internal Medicine

## 2015-06-30 ENCOUNTER — Encounter: Payer: Self-pay | Admitting: Cardiology

## 2015-06-30 DIAGNOSIS — R109 Unspecified abdominal pain: Secondary | ICD-10-CM | POA: Diagnosis not present

## 2015-06-30 DIAGNOSIS — R197 Diarrhea, unspecified: Secondary | ICD-10-CM

## 2015-06-30 DIAGNOSIS — R110 Nausea: Secondary | ICD-10-CM

## 2015-06-30 NOTE — Assessment & Plan Note (Signed)
Stable. No active anginal symptoms. Mild post CABG chest wall pain from sternal wires. No exertional pain. Remains active and fit. Not as active as maybe he would like to be, but still doing well. Negative Myoview today. Normal EF. Remains on his work he dosing of Lopressor which took a long time to determine the appropriate dosing. He is on ARB as well as Crestor. He is taking 5 mg Crestor long with coenzyme Q 10.

## 2015-06-30 NOTE — Assessment & Plan Note (Signed)
Blood pressure looks okay today. Continue current regimen.

## 2015-06-30 NOTE — Assessment & Plan Note (Signed)
His labs are being followed by his PCP. Last check was in December. He seems to be tolerating 5 mg Crestor without too much the way of myalgias. He is on coenzyme Q 10 which seems to be helping. Goal LDL is less than 70 and HDL greater than 40.

## 2015-07-05 DIAGNOSIS — L719 Rosacea, unspecified: Secondary | ICD-10-CM | POA: Diagnosis not present

## 2015-07-05 DIAGNOSIS — H01003 Unspecified blepharitis right eye, unspecified eyelid: Secondary | ICD-10-CM | POA: Diagnosis not present

## 2015-07-05 DIAGNOSIS — H01006 Unspecified blepharitis left eye, unspecified eyelid: Secondary | ICD-10-CM | POA: Diagnosis not present

## 2015-07-05 DIAGNOSIS — Z85828 Personal history of other malignant neoplasm of skin: Secondary | ICD-10-CM | POA: Diagnosis not present

## 2015-07-05 DIAGNOSIS — Z9889 Other specified postprocedural states: Secondary | ICD-10-CM | POA: Diagnosis not present

## 2015-07-05 DIAGNOSIS — H1013 Acute atopic conjunctivitis, bilateral: Secondary | ICD-10-CM | POA: Diagnosis not present

## 2015-07-14 ENCOUNTER — Other Ambulatory Visit: Payer: Self-pay | Admitting: General Surgery

## 2015-07-14 ENCOUNTER — Ambulatory Visit: Payer: Self-pay | Admitting: General Surgery

## 2015-07-14 DIAGNOSIS — K8020 Calculus of gallbladder without cholecystitis without obstruction: Secondary | ICD-10-CM | POA: Diagnosis not present

## 2015-07-14 NOTE — H&P (Signed)
[name] 07/14/2015 1:44 PM Location: [name] Patient #: [id number] DOB: 08-27-1945 Married / Language: [name] / Race: White Male  History of Present Illness [name]; 07/14/2015 2:38 PM) Patient words: gallbladder.  The patient is a 70 year old male.   Note:He is referred by Dr. Shelia Hopkins for consultation regarding epigastric and right upper quadrant discomfort with nausea and gassiness. He came back from [place] recently and had 2 weeks of diarrhea. Prior to that he is having intermittent episodes of epigastric bloating some right upper quadrant discomfort. Transient nausea. Since his episode of diarrhea, which is resolved, he is having more nausea and bloating. Ultrasound was performed demonstrating gallbladder sludge, multiple gallstones, and abnormal liver architecture. Normal common bile duct diameter. Gallbladder wall was slightly thickened. Liver function tests normal. No family history of gallbladder disease. No jaundice. Drank heavily in the early 80s but rarely drinks since then.  Other Problems (Isaac Eversole, LPN; 075-GRM QA348G PM) Arthritis Gastric Ulcer Gastroesophageal Reflux Disease Hemorrhoids High blood pressure Melanoma  Past Surgical History (Isaac Eversole, LPN; 075-GRM QA348G PM) Coronary Artery Bypass Graft Knee Hopkins Bilateral. Tonsillectomy Vasectomy  Diagnostic Studies History (Isaac Eversole, LPN; 075-GRM QA348G PM) Colonoscopy 1-5 years ago  Allergies (Isaac Eversole, LPN; 075-GRM 579FGE PM) Sulfa Antibiotics Cephalosporins Lisinopril *CHEMICALS* Maxzide *DIURETICS* Vioxx *ANALGESICS - ANTI-INFLAMMATORY* Lipitor *ANTIHYPERLIPIDEMICS* Statins  Medication History (Isaac Eversole, LPN; 075-GRM 579FGE PM) Aspirin (81MG  Tablet, Oral daily) Active. Testosterone (100MG /ML Suspension, Intramuscular) Active. Valsartan (160MG  Capsule, Oral) Active. Co Q 10 (100MG  Capsule, Oral) Active. Metoprolol  Tartrate (25MG  Tablet, Oral) Active. Crestor (10MG  Tablet, Oral) Active. Doxycycline Hyclate (100MG  Capsule, Oral) Active. ZyrTEC (10MG  Tablet, Oral) Active. Pantoprazole Sodium (40MG  Packet, Oral) Active. Fish Oil (1000MG  Capsule, Oral) Active. Caltrate 600+D (600-400MG -UNIT Tablet, Oral daily) Active. Tylenol 8 Hour (650MG  Tablet ER, Oral) Active. Finacea (15% Gel, External) Active. TraMADol HCl (Oral) Specific dose unknown - Active. Flintstones Complete (60MG  Tablet Chewable, Oral) Active. Medications Reconciled  Social History (Isaac Eversole, LPN; 075-GRM QA348G PM) No alcohol use No caffeine use No drug use Tobacco use Never smoker.  Family History (Isaac Eversole, LPN; 075-GRM QA348G PM) Alcohol Abuse Brother. Arthritis Family Members In General. Cancer Father. Colon Polyps Mother. Heart disease in male family member before age 64 Hypertension Brother, Father. Prostate Cancer Brother. Thyroid problems Mother.     Review of Systems (Isaac Eversole LPN; 075-GRM QA348G PM) General Not Present- Appetite Loss, Chills, Fatigue, Fever, Night Sweats, Weight Gain and Weight Loss. Skin Not Present- Change in Wart/Mole, Dryness, Hives, Jaundice, New Lesions, Non-Healing Wounds, Rash and Ulcer. HEENT Present- Seasonal Allergies and Wears glasses/contact lenses. Not Present- Earache, Hearing Loss, Hoarseness, Nose Bleed, Oral Ulcers, Ringing in the Ears, Sinus Pain, Sore Throat, Visual Disturbances and Yellow Eyes. Respiratory Not Present- Bloody sputum, Chronic Cough, Difficulty Breathing, Snoring and Wheezing. Breast Not Present- Breast Mass, Breast Pain, Nipple Discharge and Skin Changes. Cardiovascular Not Present- Chest Pain, Difficulty Breathing Lying Down, Leg Cramps, Palpitations, Rapid Heart Rate, Shortness of Breath and Swelling of Extremities. Gastrointestinal Present- Bloating, Excessive gas and Nausea. Not Present- Abdominal Pain, Bloody Stool,  Change in Bowel Habits, Chronic diarrhea, Constipation, Difficulty Swallowing, Gets full quickly at meals, Hemorrhoids, Indigestion, Rectal Pain and Vomiting. Male Genitourinary Not Present- Blood in Urine, Change in Urinary Stream, Frequency, Impotence, Nocturia, Painful Urination, Urgency and Urine Leakage. Musculoskeletal Not Present- Back Pain, Joint Pain, Joint Stiffness, Muscle Pain, Muscle Weakness and Swelling of Extremities. Neurological Not Present- Decreased Memory, Fainting, Headaches, Numbness, Seizures,  Tingling, Tremor, Trouble walking and Weakness. Psychiatric Not Present- Anxiety, Bipolar, Change in Sleep Pattern, Depression, Fearful and Frequent crying. Endocrine Not Present- Cold Intolerance, Excessive Hunger, Hair Changes, Heat Intolerance, Hot flashes and New Diabetes. Hematology Present- Blood Thinners. Not Present- Easy Bruising, Excessive bleeding, Gland problems, HIV and Persistent Infections.  Vitals (Isaac Eversole LPN; 075-GRM QA348G PM) 07/14/2015 1:46 PM Weight: 179.6 lb Height: 70in Body Surface Area: 1.99 m Body Mass Index: 25.77 kg/m  Temp.: 47F(Oral)  Pulse: 69 (Regular)  P.OX: 98% (Room air) BP: 140/70 (Sitting, Left Arm, Standard)      Physical Exam [name]; 07/14/2015 2:41 PM)  The physical exam findings are as follows: Note:General: WDWN in NAD. Pleasant and cooperative.  HEENT: [place]/AT, no external nasal or ear masses, mucous membranes are moist  EYES: EOMI, no scleral icterus, pupils normal  NECK: Supple, no obvious mass or thyroid mass/enlargement, no trachea deviation  CV: RRR, no murmur, no edema  CHEST: Breath sounds equal and clear. Respirations nonlabored. Midsternal scar.  ABDOMEN: Soft, mild RUQ tenderness and fullness, active bowel sounds, no scars, no hernias.  LYMPHATIC: No palpable cervical, supraclavicular.  SKIN: No jaundice or suspicious rashes.  NEUROLOGIC: Alert and oriented, answers questions  appropriately, normal gait and station.  PSYCHIATRIC: Normal mood, affect , and behavior.    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 He has gallstones, sludge, and an atypical appearing liver on ultrasound. Recent cardiac stress test demonstrated no significant abnormalities.  Plan: I suggested laparoscopic cholecystectomy with cholangiogram and he is interested in proceeding with that. I have explained the procedure, risks, and aftercare of cholecystectomy. Risks include but are not limited to bleeding, infection, wound problems, anesthesia, diarrhea, bile leak, injury to common bile duct/liver/intestine. He seems to understand and agrees to proceed.  Jackolyn Confer, Hopkins

## 2015-07-18 ENCOUNTER — Telehealth: Payer: Self-pay | Admitting: Cardiology

## 2015-07-18 NOTE — Telephone Encounter (Signed)
New message       Request for surgical clearance:  What type of surgery is being performed? Gall bladder surgery When is this surgery scheduled? 07-29-15 Are there any medications that need to be held prior to surgery and how long? Need cardiac clearance because pt had open heart surgery approx 1 month ago Name of physician performing surgery? Dr Zella Richer 1. What is your office phone and fax number?  Send message in epic

## 2015-07-18 NOTE — Telephone Encounter (Signed)
Please advise 

## 2015-07-19 ENCOUNTER — Encounter (HOSPITAL_COMMUNITY)
Admission: RE | Admit: 2015-07-19 | Discharge: 2015-07-19 | Disposition: A | Discharge status: Home / Self Care | Payer: Medicare Other | Source: Ambulatory Visit | Attending: General Surgery | Admitting: General Surgery

## 2015-07-19 ENCOUNTER — Encounter (HOSPITAL_COMMUNITY): Payer: Self-pay

## 2015-07-19 ENCOUNTER — Encounter (INDEPENDENT_AMBULATORY_CARE_PROVIDER_SITE_OTHER): Payer: Self-pay

## 2015-07-19 DIAGNOSIS — E785 Hyperlipidemia, unspecified: Secondary | ICD-10-CM | POA: Insufficient documentation

## 2015-07-19 DIAGNOSIS — Z01810 Encounter for preprocedural cardiovascular examination: Secondary | ICD-10-CM | POA: Diagnosis not present

## 2015-07-19 DIAGNOSIS — I2510 Atherosclerotic heart disease of native coronary artery without angina pectoris: Secondary | ICD-10-CM | POA: Diagnosis not present

## 2015-07-19 DIAGNOSIS — Z01812 Encounter for preprocedural laboratory examination: Secondary | ICD-10-CM | POA: Diagnosis not present

## 2015-07-19 DIAGNOSIS — I10 Essential (primary) hypertension: Secondary | ICD-10-CM | POA: Insufficient documentation

## 2015-07-19 DIAGNOSIS — Z951 Presence of aortocoronary bypass graft: Secondary | ICD-10-CM | POA: Diagnosis not present

## 2015-07-19 DIAGNOSIS — K219 Gastro-esophageal reflux disease without esophagitis: Secondary | ICD-10-CM | POA: Insufficient documentation

## 2015-07-19 HISTORY — DX: Other specified personal risk factors, not elsewhere classified: Z91.89

## 2015-07-19 HISTORY — DX: Allergy status to unspecified drugs, medicaments and biological substances: Z88.9

## 2015-07-19 HISTORY — DX: Postnasal drip: R09.82

## 2015-07-19 HISTORY — DX: Personal history of other specified conditions: Z87.898

## 2015-07-19 HISTORY — DX: Malignant (primary) neoplasm, unspecified: C80.1

## 2015-07-19 HISTORY — DX: Presence of spectacles and contact lenses: Z97.3

## 2015-07-19 HISTORY — DX: Gastro-esophageal reflux disease without esophagitis: K21.9

## 2015-07-19 HISTORY — DX: Urge incontinence: N39.41

## 2015-07-19 HISTORY — DX: Unspecified osteoarthritis, unspecified site: M19.90

## 2015-07-19 LAB — CBC WITH DIFFERENTIAL/PLATELET
BASOS ABS: 0 10*3/uL (ref 0.0–0.1)
Basophils Relative: 0 %
EOS PCT: 2 %
Eosinophils Absolute: 0.1 10*3/uL (ref 0.0–0.7)
HCT: 47.3 % (ref 39.0–52.0)
Hemoglobin: 16.2 g/dL (ref 13.0–17.0)
LYMPHS PCT: 27 %
Lymphs Abs: 1.6 10*3/uL (ref 0.7–4.0)
MCH: 30.1 pg (ref 26.0–34.0)
MCHC: 34.2 g/dL (ref 30.0–36.0)
MCV: 87.9 fL (ref 78.0–100.0)
Monocytes Absolute: 0.7 10*3/uL (ref 0.1–1.0)
Monocytes Relative: 11 %
Neutro Abs: 3.5 10*3/uL (ref 1.7–7.7)
Neutrophils Relative %: 60 %
PLATELETS: 210 10*3/uL (ref 150–400)
RBC: 5.38 MIL/uL (ref 4.22–5.81)
RDW: 13.4 % (ref 11.5–15.5)
WBC: 5.9 10*3/uL (ref 4.0–10.5)

## 2015-07-19 LAB — COMPREHENSIVE METABOLIC PANEL
ALT: 34 U/L (ref 17–63)
AST: 30 U/L (ref 15–41)
Albumin: 4.3 g/dL (ref 3.5–5.0)
Alkaline Phosphatase: 54 U/L (ref 38–126)
Anion gap: 5 (ref 5–15)
BUN: 22 mg/dL — AB (ref 6–20)
CHLORIDE: 102 mmol/L (ref 101–111)
CO2: 30 mmol/L (ref 22–32)
CREATININE: 1.33 mg/dL — AB (ref 0.61–1.24)
Calcium: 8.8 mg/dL — ABNORMAL LOW (ref 8.9–10.3)
GFR calc Af Amer: >60 mL/min (ref >60)
GFR, EST NON AFRICAN AMERICAN: 53 mL/min — AB (ref >60)
GLUCOSE: 93 mg/dL (ref 65–99)
Potassium: 4.3 mmol/L (ref 3.5–5.1)
SODIUM: 137 mmol/L (ref 135–145)
Total Bilirubin: 0.8 mg/dL (ref 0.3–1.2)
Total Protein: 6.8 g/dL (ref 6.5–8.1)

## 2015-07-19 LAB — PROTIME-INR
INR: 1.09 (ref 0.00–1.49)
Prothrombin Time: 13.9 seconds (ref 11.6–15.2)

## 2015-07-19 NOTE — Patient Instructions (Signed)
[name]  07/19/2015   Your procedure is scheduled on: Friday July 29, 2015  Report to [facility] Main  Entrance take [place]  elevators to 3rd floor to  [place] at 9:00  AM.  Call this number if you have problems the morning of surgery [phone]   Remember: ONLY 1 PERSON MAY GO WITH YOU TO SHORT STAY TO GET  READY MORNING OF [place].  Do not eat food or drink liquids :After Midnight.     Take these medicines the morning of surgery with A SIP OF WATER: Cetirizine (Zyrtec) if needed; Metoprolol; Pantoprazole                                You may not have any metal on your body including hair pins and              piercings  Do not wear jewelry, lotions, powders or colognes, deodorant                           Men may shave face and neck.   Do not bring valuables to the hospital. [place].  Contacts, dentures or bridgework may not be worn into surgery.      Patients discharged the day of surgery will not be allowed to drive home.  Name and phone number of your driver:[name] (wife)  _____________________________________________________________________             [facility] - Preparing for Surgery Before surgery, you can play an important role.  Because skin is not sterile, your skin needs to be as free of germs as possible.  You can reduce the number of germs on your skin by washing with CHG (chlorahexidine gluconate) soap before surgery.  CHG is an antiseptic cleaner which kills germs and bonds with the skin to continue killing germs even after washing. Please DO NOT use if you have an allergy to CHG or antibacterial soaps.  If your skin becomes reddened/irritated stop using the CHG and inform your nurse when you arrive at Short Stay. Do not shave (including legs and underarms) for at least 48 hours prior to the first CHG shower.  You may shave your face/neck. Please follow these  instructions carefully:  1.  Shower with CHG Soap the night before surgery and the  morning of Surgery.  2.  If you choose to wash your hair, wash your hair first as usual with your  normal  shampoo.  3.  After you shampoo, rinse your hair and body thoroughly to remove the  shampoo.                           4.  Use CHG as you would any other liquid soap.  You can apply chg directly  to the skin and wash                       Gently with a scrungie or clean washcloth.  5.  Apply the CHG Soap to your body ONLY FROM THE NECK DOWN.   Do not use on face/ open  Wound or open sores. Avoid contact with eyes, ears mouth and genitals (private parts).                       Wash face,  Genitals (private parts) with your normal soap.             6.  Wash thoroughly, paying special attention to the area where your surgery  will be performed.  7.  Thoroughly rinse your body with warm water from the neck down.  8.  DO NOT shower/wash with your normal soap after using and rinsing off  the CHG Soap.                9.  Pat yourself dry with a clean towel.            10.  Wear clean pajamas.            11.  Place clean sheets on your bed the night of your first shower and do not  sleep with pets. Day of Surgery : Do not apply any lotions/deodorants the morning of surgery.  Please wear clean clothes to the hospital/surgery center.  FAILURE TO FOLLOW THESE INSTRUCTIONS MAY RESULT IN THE CANCELLATION OF YOUR SURGERY PATIENT SIGNATURE_________________________________  NURSE SIGNATURE__________________________________  ________________________________________________________________________

## 2015-07-26 NOTE — Telephone Encounter (Signed)
Attempted to call Tonya at [facility]. Number rang and rang with no answer or way to leave message. They should be able to view clearance in EPIC. Will try again later.

## 2015-07-26 NOTE — Telephone Encounter (Signed)
He just had a negative Myoview last month. He would be low risk for intermediate-high risk abdominal  Surgery.  Would not need any further cardiac evaluation. Asymptomatic with negative Myoview. No heart failure. No stroke. No diabetes with normal renal function.   Glenetta Hew, MD

## 2015-07-27 NOTE — Telephone Encounter (Signed)
Message routed to Dr. Zella Richer via EPIC.  Clearance note in EPIC should be viewable to [facility] pre-surgical staff

## 2015-07-29 ENCOUNTER — Ambulatory Visit (HOSPITAL_COMMUNITY): Payer: Medicare Other

## 2015-07-29 ENCOUNTER — Ambulatory Visit (HOSPITAL_COMMUNITY): Payer: Medicare Other | Admitting: Certified Registered Nurse Anesthetist

## 2015-07-29 ENCOUNTER — Ambulatory Visit (HOSPITAL_COMMUNITY)
Admission: RE | Admit: 2015-07-29 | Discharge: 2015-07-29 | Disposition: A | Discharge status: Home / Self Care | Payer: Medicare Other | Source: Ambulatory Visit | Attending: General Surgery | Admitting: General Surgery

## 2015-07-29 ENCOUNTER — Encounter (HOSPITAL_COMMUNITY)
Admission: RE | Disposition: A | Discharge status: Home / Self Care | Payer: Self-pay | Source: Ambulatory Visit | Attending: General Surgery

## 2015-07-29 ENCOUNTER — Encounter (HOSPITAL_COMMUNITY): Payer: Self-pay | Admitting: *Deleted

## 2015-07-29 DIAGNOSIS — Z951 Presence of aortocoronary bypass graft: Secondary | ICD-10-CM | POA: Insufficient documentation

## 2015-07-29 DIAGNOSIS — I2510 Atherosclerotic heart disease of native coronary artery without angina pectoris: Secondary | ICD-10-CM | POA: Insufficient documentation

## 2015-07-29 DIAGNOSIS — Z8719 Personal history of other diseases of the digestive system: Secondary | ICD-10-CM | POA: Diagnosis not present

## 2015-07-29 DIAGNOSIS — Z85820 Personal history of malignant melanoma of skin: Secondary | ICD-10-CM | POA: Diagnosis not present

## 2015-07-29 DIAGNOSIS — I10 Essential (primary) hypertension: Secondary | ICD-10-CM | POA: Insufficient documentation

## 2015-07-29 DIAGNOSIS — Z7982 Long term (current) use of aspirin: Secondary | ICD-10-CM | POA: Insufficient documentation

## 2015-07-29 DIAGNOSIS — Z79899 Other long term (current) drug therapy: Secondary | ICD-10-CM | POA: Insufficient documentation

## 2015-07-29 DIAGNOSIS — K8020 Calculus of gallbladder without cholecystitis without obstruction: Secondary | ICD-10-CM | POA: Diagnosis not present

## 2015-07-29 DIAGNOSIS — K811 Chronic cholecystitis: Secondary | ICD-10-CM | POA: Insufficient documentation

## 2015-07-29 DIAGNOSIS — Z419 Encounter for procedure for purposes other than remedying health state, unspecified: Secondary | ICD-10-CM

## 2015-07-29 DIAGNOSIS — K219 Gastro-esophageal reflux disease without esophagitis: Secondary | ICD-10-CM | POA: Diagnosis not present

## 2015-07-29 DIAGNOSIS — K838 Other specified diseases of biliary tract: Secondary | ICD-10-CM | POA: Diagnosis not present

## 2015-07-29 HISTORY — PX: CHOLECYSTECTOMY: SHX55

## 2015-07-29 SURGERY — LAPAROSCOPIC CHOLECYSTECTOMY WITH INTRAOPERATIVE CHOLANGIOGRAM
Anesthesia: General

## 2015-07-29 MED ORDER — DEXAMETHASONE SODIUM PHOSPHATE 10 MG/ML IJ SOLN
INTRAMUSCULAR | Status: AC
  Filled 2015-07-29: qty 1

## 2015-07-29 MED ORDER — DEXTROSE IN LACTATED RINGERS 5 % IV SOLN: INTRAVENOUS | Status: DC

## 2015-07-29 MED ORDER — LIDOCAINE HCL (CARDIAC) 20 MG/ML IV SOLN
INTRAVENOUS | Status: DC | PRN
  Administered 2015-07-29: 75 mg via INTRAVENOUS

## 2015-07-29 MED ORDER — EPHEDRINE SULFATE 50 MG/ML IJ SOLN
INTRAMUSCULAR | Status: DC | PRN
  Administered 2015-07-29 (×2): 10 mg via INTRAVENOUS
  Administered 2015-07-29: 5 mg via INTRAVENOUS

## 2015-07-29 MED ORDER — IOPAMIDOL (ISOVUE-300) INJECTION 61%
INTRAVENOUS | Status: AC
  Filled 2015-07-29: qty 50

## 2015-07-29 MED ORDER — HYDROMORPHONE HCL 1 MG/ML IJ SOLN: 0.2500 mg | INTRAMUSCULAR | Status: DC | PRN

## 2015-07-29 MED ORDER — BUPIVACAINE HCL 0.5 % IJ SOLN
INTRAMUSCULAR | Status: DC | PRN
  Administered 2015-07-29: 10 mL

## 2015-07-29 MED ORDER — FENTANYL CITRATE (PF) 250 MCG/5ML IJ SOLN
INTRAMUSCULAR | Status: AC
  Filled 2015-07-29: qty 5

## 2015-07-29 MED ORDER — SUGAMMADEX SODIUM 500 MG/5ML IV SOLN
INTRAVENOUS | Status: DC | PRN
  Administered 2015-07-29: 400 mg via INTRAVENOUS

## 2015-07-29 MED ORDER — ACETAMINOPHEN 500 MG PO TABS
1000.0000 mg | ORAL_TABLET | Freq: Four times a day (QID) | ORAL | Status: DC
  Administered 2015-07-29: 1000 mg via ORAL
  Filled 2015-07-29 (×5): qty 2

## 2015-07-29 MED ORDER — SODIUM CHLORIDE 0.9% FLUSH: 3.0000 mL | INTRAVENOUS | Status: DC | PRN

## 2015-07-29 MED ORDER — ONDANSETRON HCL 4 MG/2ML IJ SOLN
INTRAMUSCULAR | Status: AC
  Filled 2015-07-29: qty 2

## 2015-07-29 MED ORDER — IOPAMIDOL (ISOVUE-300) INJECTION 61%
INTRAVENOUS | Status: DC | PRN
  Administered 2015-07-29: 4 mL

## 2015-07-29 MED ORDER — LACTATED RINGERS IV SOLN
INTRAVENOUS | Status: DC
  Administered 2015-07-29 (×2): via INTRAVENOUS

## 2015-07-29 MED ORDER — ROCURONIUM BROMIDE 100 MG/10ML IV SOLN
INTRAVENOUS | Status: DC | PRN
  Administered 2015-07-29: 30 mg via INTRAVENOUS

## 2015-07-29 MED ORDER — PROPOFOL 10 MG/ML IV BOLUS
INTRAVENOUS | Status: AC
  Filled 2015-07-29: qty 20

## 2015-07-29 MED ORDER — BUPIVACAINE HCL (PF) 0.5 % IJ SOLN
INTRAMUSCULAR | Status: AC
  Filled 2015-07-29: qty 30

## 2015-07-29 MED ORDER — LACTATED RINGERS IV SOLN
INTRAVENOUS | Status: DC | PRN
  Administered 2015-07-29 (×2): via INTRAVENOUS

## 2015-07-29 MED ORDER — MORPHINE SULFATE (PF) 10 MG/ML IV SOLN: 2.0000 mg | INTRAVENOUS | Status: DC | PRN

## 2015-07-29 MED ORDER — SCOPOLAMINE 1 MG/3DAYS TD PT72
MEDICATED_PATCH | TRANSDERMAL | Status: AC
  Filled 2015-07-29: qty 1

## 2015-07-29 MED ORDER — MIDAZOLAM HCL 5 MG/5ML IJ SOLN
INTRAMUSCULAR | Status: DC | PRN
  Administered 2015-07-29 (×2): 1 mg via INTRAVENOUS

## 2015-07-29 MED ORDER — FENTANYL CITRATE (PF) 100 MCG/2ML IJ SOLN
INTRAMUSCULAR | Status: DC | PRN
  Administered 2015-07-29: 50 ug via INTRAVENOUS
  Administered 2015-07-29: 100 ug via INTRAVENOUS

## 2015-07-29 MED ORDER — PROPOFOL 10 MG/ML IV BOLUS
INTRAVENOUS | Status: DC | PRN
  Administered 2015-07-29: 150 mg via INTRAVENOUS

## 2015-07-29 MED ORDER — PROMETHAZINE HCL 25 MG/ML IJ SOLN: 6.2500 mg | INTRAMUSCULAR | Status: DC | PRN

## 2015-07-29 MED ORDER — MIDAZOLAM HCL 2 MG/2ML IJ SOLN
INTRAMUSCULAR | Status: AC
  Filled 2015-07-29: qty 2

## 2015-07-29 MED ORDER — ROCURONIUM BROMIDE 100 MG/10ML IV SOLN
INTRAVENOUS | Status: AC
  Filled 2015-07-29: qty 1

## 2015-07-29 MED ORDER — 0.9 % SODIUM CHLORIDE (POUR BTL) OPTIME
TOPICAL | Status: DC | PRN
  Administered 2015-07-29: 1000 mL

## 2015-07-29 MED ORDER — SUCCINYLCHOLINE CHLORIDE 20 MG/ML IJ SOLN
INTRAMUSCULAR | Status: DC | PRN
  Administered 2015-07-29: 100 mg via INTRAVENOUS

## 2015-07-29 MED ORDER — SCOPOLAMINE 1 MG/3DAYS TD PT72
1.0000 | MEDICATED_PATCH | TRANSDERMAL | Status: DC
  Administered 2015-07-29: 1.5 mg via TRANSDERMAL

## 2015-07-29 MED ORDER — CHLORHEXIDINE GLUCONATE CLOTH 2 % EX PADS: 6.0000 | MEDICATED_PAD | Freq: Once | CUTANEOUS | Status: DC

## 2015-07-29 MED ORDER — ONDANSETRON HCL 4 MG/2ML IJ SOLN
INTRAMUSCULAR | Status: DC | PRN
  Administered 2015-07-29: 4 mg via INTRAVENOUS

## 2015-07-29 MED ORDER — LIDOCAINE HCL (CARDIAC) 20 MG/ML IV SOLN
INTRAVENOUS | Status: AC
  Filled 2015-07-29: qty 5

## 2015-07-29 MED ORDER — SODIUM CHLORIDE 0.9% FLUSH: 3.0000 mL | Freq: Two times a day (BID) | INTRAVENOUS | Status: DC

## 2015-07-29 MED ORDER — SODIUM CHLORIDE 0.9 % IV SOLN: 250.0000 mL | INTRAVENOUS | Status: DC | PRN

## 2015-07-29 MED ORDER — ONDANSETRON HCL 4 MG PO TABS: 4.0000 mg | ORAL_TABLET | ORAL | Status: DC | PRN

## 2015-07-29 MED ORDER — CIPROFLOXACIN IN D5W 400 MG/200ML IV SOLN
INTRAVENOUS | Status: AC
  Filled 2015-07-29: qty 200

## 2015-07-29 MED ORDER — CIPROFLOXACIN IN D5W 400 MG/200ML IV SOLN
400.0000 mg | INTRAVENOUS | Status: AC
  Administered 2015-07-29: 400 mg via INTRAVENOUS

## 2015-07-29 MED ORDER — LACTATED RINGERS IR SOLN
Status: DC | PRN
  Administered 2015-07-29: 3000 mL

## 2015-07-29 MED ORDER — OXYCODONE HCL 5 MG PO TABS: 5.0000 mg | ORAL_TABLET | ORAL | Status: DC | PRN

## 2015-07-29 SURGICAL SUPPLY — 42 items
APPLIER CLIP 5 13 M/L LIGAMAX5 (MISCELLANEOUS) ×2
BENZOIN TINCTURE PRP APPL 2/3 (GAUZE/BANDAGES/DRESSINGS) ×2 IMPLANT
CABLE HIGH FREQUENCY MONO STRZ (ELECTRODE) ×2 IMPLANT
CATH REDDICK CHOLANGI 4FR 50CM (CATHETERS) ×2 IMPLANT
CHLORAPREP W/TINT 26ML (MISCELLANEOUS) ×2 IMPLANT
CLIP APPLIE 5 13 M/L LIGAMAX5 (MISCELLANEOUS) ×1 IMPLANT
CLOSURE STERI-STRIP 1/4X4 (GAUZE/BANDAGES/DRESSINGS) ×2 IMPLANT
COVER MAYO STAND STRL (DRAPES) ×2 IMPLANT
COVER SURGICAL LIGHT HANDLE (MISCELLANEOUS) ×2 IMPLANT
DECANTER SPIKE VIAL GLASS SM (MISCELLANEOUS) ×2 IMPLANT
DISSECTOR BLUNT TIP ENDO 5MM (MISCELLANEOUS) IMPLANT
DRAPE C-ARM 42X120 X-RAY (DRAPES) ×2 IMPLANT
DRAPE LAPAROSCOPIC ABDOMINAL (DRAPES) ×2 IMPLANT
DRSG TEGADERM 2-3/8X2-3/4 SM (GAUZE/BANDAGES/DRESSINGS) ×2 IMPLANT
ELECT REM PT RETURN 9FT ADLT (ELECTROSURGICAL) ×2
ELECTRODE REM PT RTRN 9FT ADLT (ELECTROSURGICAL) ×1 IMPLANT
ENDOLOOP SUT PDS II  0 18 (SUTURE)
ENDOLOOP SUT PDS II 0 18 (SUTURE) IMPLANT
GAUZE SPONGE 2X2 8PLY STRL LF (GAUZE/BANDAGES/DRESSINGS) ×1 IMPLANT
GLOVE ECLIPSE 8.0 STRL XLNG CF (GLOVE) ×2 IMPLANT
GLOVE INDICATOR 8.0 STRL GRN (GLOVE) ×2 IMPLANT
GOWN STRL REUS W/TWL XL LVL3 (GOWN DISPOSABLE) ×6 IMPLANT
HEMOSTAT SNOW SURGICEL 2X4 (HEMOSTASIS) IMPLANT
IRRIG SUCT STRYKERFLOW 2 WTIP (MISCELLANEOUS) ×2
IRRIGATION SUCT STRKRFLW 2 WTP (MISCELLANEOUS) ×1 IMPLANT
IV CATH 14GX2 1/4 (CATHETERS) ×2 IMPLANT
KIT BASIN OR (CUSTOM PROCEDURE TRAY) ×8 IMPLANT
POSITIONER SURGICAL ARM (MISCELLANEOUS) IMPLANT
POUCH SPECIMEN RETRIEVAL 10MM (ENDOMECHANICALS) ×2 IMPLANT
SCISSORS LAP 5X35 DISP (ENDOMECHANICALS) ×2 IMPLANT
SLEEVE XCEL OPT CAN 5 100 (ENDOMECHANICALS) ×4 IMPLANT
SPONGE GAUZE 2X2 STER 10/PKG (GAUZE/BANDAGES/DRESSINGS) ×1
STRIP CLOSURE SKIN 1/2X4 (GAUZE/BANDAGES/DRESSINGS) ×2 IMPLANT
SUT MNCRL AB 4-0 PS2 18 (SUTURE) ×2 IMPLANT
TAPE CLOTH 4X10 WHT NS (GAUZE/BANDAGES/DRESSINGS) IMPLANT
TOWEL OR 17X26 10 PK STRL BLUE (TOWEL DISPOSABLE) ×2 IMPLANT
TOWEL OR NON WOVEN STRL DISP B (DISPOSABLE) IMPLANT
TRAY LAPAROSCOPIC (CUSTOM PROCEDURE TRAY) ×2 IMPLANT
TROCAR BLADELESS OPT 5 100 (ENDOMECHANICALS) ×2 IMPLANT
TROCAR XCEL BLUNT TIP 100MML (ENDOMECHANICALS) ×2 IMPLANT
TROCAR XCEL NON-BLD 11X100MML (ENDOMECHANICALS) IMPLANT
TUBING INSUF HEATED (TUBING) ×2 IMPLANT

## 2015-07-29 NOTE — Anesthesia Postprocedure Evaluation (Signed)
Anesthesia Post Note  Patient: [name]  Procedure(s) Performed: Procedure(s) (LRB): LAPAROSCOPIC CHOLECYSTECTOMY WITH INTRAOPERATIVE CHOLANGIOGRAM (N/A)  Patient location during evaluation: PACU Anesthesia Type: General Level of consciousness: sedated Pain management: pain level controlled Vital Signs Assessment: post-procedure vital signs reviewed and stable Respiratory status: spontaneous breathing and respiratory function stable Cardiovascular status: stable Anesthetic complications: no    Last Vitals:  Filed Vitals:   07/29/15 1345 07/29/15 1400  BP: 140/71 128/73  Pulse: 56 62  Temp: 36.4 C 36.4 C  Resp: 17 17    Last Pain: There were no vitals filed for this visit.               Cora Brierley DANIEL

## 2015-07-29 NOTE — H&P (View-Only) (Signed)
Loura Back Flam 07/14/2015 1:44 PM Location: [facility] Patient #: [id number] DOB: August 16, 1945 Married / Language: [name] / Race: White Male  History of Present Illness [name]; 07/14/2015 2:38 PM) Patient words: gallbladder.  The patient is a 70 year old male.   Note:He is referred by Dr. Shelia Media for consultation regarding epigastric and right upper quadrant discomfort with nausea and gassiness. He came back from [place] recently and had 2 weeks of diarrhea. Prior to that he is having intermittent episodes of epigastric bloating some right upper quadrant discomfort. Transient nausea. Since his episode of diarrhea, which is resolved, he is having more nausea and bloating. Ultrasound was performed demonstrating gallbladder sludge, multiple gallstones, and abnormal liver architecture. Normal common bile duct diameter. Gallbladder wall was slightly thickened. Liver function tests normal. No family history of gallbladder disease. No jaundice. Drank heavily in the early 80s but rarely drinks since then.  Other Problems (Ammie Eversole, LPN; 075-GRM QA348G PM) Arthritis Gastric Ulcer Gastroesophageal Reflux Disease Hemorrhoids High blood pressure Melanoma  Past Surgical History (Ammie Eversole, LPN; 075-GRM QA348G PM) Coronary Artery Bypass Graft Knee Surgery Bilateral. Tonsillectomy Vasectomy  Diagnostic Studies History (Ammie Eversole, LPN; 075-GRM QA348G PM) Colonoscopy 1-5 years ago  Allergies (Ammie Eversole, LPN; 075-GRM 579FGE PM) Sulfa Antibiotics Cephalosporins Lisinopril *CHEMICALS* Maxzide *DIURETICS* Vioxx *ANALGESICS - ANTI-INFLAMMATORY* Lipitor *ANTIHYPERLIPIDEMICS* Statins  Medication History (Ammie Eversole, LPN; 075-GRM 579FGE PM) Aspirin (81MG  Tablet, Oral daily) Active. Testosterone (100MG /ML Suspension, Intramuscular) Active. Valsartan (160MG  Capsule, Oral) Active. Co Q 10 (100MG  Capsule, Oral) Active. Metoprolol  Tartrate (25MG  Tablet, Oral) Active. Crestor (10MG  Tablet, Oral) Active. Doxycycline Hyclate (100MG  Capsule, Oral) Active. ZyrTEC (10MG  Tablet, Oral) Active. Pantoprazole Sodium (40MG  Packet, Oral) Active. Fish Oil (1000MG  Capsule, Oral) Active. Caltrate 600+D (600-400MG -UNIT Tablet, Oral daily) Active. Tylenol 8 Hour (650MG  Tablet ER, Oral) Active. Finacea (15% Gel, External) Active. TraMADol HCl (Oral) Specific dose unknown - Active. Flintstones Complete (60MG  Tablet Chewable, Oral) Active. Medications Reconciled  Social History (Ammie Eversole, LPN; 075-GRM QA348G PM) No alcohol use No caffeine use No drug use Tobacco use Never smoker.  Family History (Ammie Eversole, LPN; 075-GRM QA348G PM) Alcohol Abuse Brother. Arthritis Family Members In General. Cancer Father. Colon Polyps Mother. Heart disease in male family member before age 41 Hypertension Brother, Father. Prostate Cancer Brother. Thyroid problems Mother.     Review of Systems (Ammie Eversole LPN; 075-GRM QA348G PM) General Not Present- Appetite Loss, Chills, Fatigue, Fever, Night Sweats, Weight Gain and Weight Loss. Skin Not Present- Change in Wart/Mole, Dryness, Hives, Jaundice, New Lesions, Non-Healing Wounds, Rash and Ulcer. HEENT Present- Seasonal Allergies and Wears glasses/contact lenses. Not Present- Earache, Hearing Loss, Hoarseness, Nose Bleed, Oral Ulcers, Ringing in the Ears, Sinus Pain, Sore Throat, Visual Disturbances and Yellow Eyes. Respiratory Not Present- Bloody sputum, Chronic Cough, Difficulty Breathing, Snoring and Wheezing. Breast Not Present- Breast Mass, Breast Pain, Nipple Discharge and Skin Changes. Cardiovascular Not Present- Chest Pain, Difficulty Breathing Lying Down, Leg Cramps, Palpitations, Rapid Heart Rate, Shortness of Breath and Swelling of Extremities. Gastrointestinal Present- Bloating, Excessive gas and Nausea. Not Present- Abdominal Pain, Bloody Stool,  Change in Bowel Habits, Chronic diarrhea, Constipation, Difficulty Swallowing, Gets full quickly at meals, Hemorrhoids, Indigestion, Rectal Pain and Vomiting. Male Genitourinary Not Present- Blood in Urine, Change in Urinary Stream, Frequency, Impotence, Nocturia, Painful Urination, Urgency and Urine Leakage. Musculoskeletal Not Present- Back Pain, Joint Pain, Joint Stiffness, Muscle Pain, Muscle Weakness and Swelling of Extremities. Neurological Not Present- Decreased Memory, Fainting, Headaches, Numbness, Seizures,  Tingling, Tremor, Trouble walking and Weakness. Psychiatric Not Present- Anxiety, Bipolar, Change in Sleep Pattern, Depression, Fearful and Frequent crying. Endocrine Not Present- Cold Intolerance, Excessive Hunger, Hair Changes, Heat Intolerance, Hot flashes and New Diabetes. Hematology Present- Blood Thinners. Not Present- Easy Bruising, Excessive bleeding, Gland problems, HIV and Persistent Infections.  Vitals (Ammie Eversole LPN; 075-GRM QA348G PM) 07/14/2015 1:46 PM Weight: 179.6 lb Height: 70in Body Surface Area: 1.99 m Body Mass Index: 25.77 kg/m  Temp.: 36F(Oral)  Pulse: 69 (Regular)  P.OX: 98% (Room air) BP: 140/70 (Sitting, Left Arm, Standard)      Physical Exam [name]; 07/14/2015 2:41 PM)  The physical exam findings are as follows: Note:General: WDWN in NAD. Pleasant and cooperative.  HEENT: [place]/AT, no external nasal or ear masses, mucous membranes are moist  EYES: EOMI, no scleral icterus, pupils normal  NECK: Supple, no obvious mass or thyroid mass/enlargement, no trachea deviation  CV: RRR, no murmur, no edema  CHEST: Breath sounds equal and clear. Respirations nonlabored. Midsternal scar.  ABDOMEN: Soft, mild RUQ tenderness and fullness, active bowel sounds, no scars, no hernias.  LYMPHATIC: No palpable cervical, supraclavicular.  SKIN: No jaundice or suspicious rashes.  NEUROLOGIC: Alert and oriented, answers questions  appropriately, normal gait and station.  PSYCHIATRIC: Normal mood, affect , and behavior.    Assessment & Plan [name]; 07/14/2015 2:36 PM)  SYMPTOMATIC CHOLELITHIASIS (K80.20) Impression: His symptoms are highly suggestive of this. He has gallstones, sludge, and an atypical appearing liver on ultrasound. Recent cardiac stress test demonstrated no significant abnormalities.  Plan: I suggested laparoscopic cholecystectomy with cholangiogram and he is interested in proceeding with that. I have explained the procedure, risks, and aftercare of cholecystectomy. Risks include but are not limited to bleeding, infection, wound problems, anesthesia, diarrhea, bile leak, injury to common bile duct/liver/intestine. He seems to understand and agrees to proceed.  Jackolyn Confer, MD

## 2015-07-29 NOTE — Transfer of Care (Signed)
Immediate Anesthesia Transfer of Care Note  Patient: [name]  Procedure(s) Performed: Procedure(s): LAPAROSCOPIC CHOLECYSTECTOMY WITH INTRAOPERATIVE CHOLANGIOGRAM (N/A)  Patient Location: PACU  Anesthesia Type:General  Level of Consciousness: sedated  Airway & Oxygen Therapy: Patient Spontanous Breathing and Patient connected to face mask oxygen  Post-op Assessment: Report given to RN and Post -op Vital signs reviewed and stable  Post vital signs: Reviewed and stable  Last Vitals:  Filed Vitals:   07/29/15 0853  BP: 130/90  Pulse: 57  Temp: 36.7 C  Resp: 18    Last Pain: There were no vitals filed for this visit.    Patients Stated Pain Goal: 5 ([id number])  Complications: No apparent anesthesia complications

## 2015-07-29 NOTE — Op Note (Signed)
OPERATIVE NOTE-LAPAROSCOPIC CHOLECYSTECTOMY  Preoperative diagnosis:  Symptomatic cholelithiasis  Postoperative diagnosis:  Same  Procedure: Laparoscopic cholecystectomy with cholangiogram.  Surgeon: Jackolyn Confer, M.D.  Asst.:  None  Anesthesia: General  Indication:   This is a 70 year old male with epigastric and RUQ pain with small gallstones and sludge.  He now presents for elective cholecystectomy.  He is felt to be low risk from a cardiology standpoint.  Technique: He was brought to the operating room, placed supine on the operating table, and a general anesthetic was administered. The hair on the abdominal wall was clipped as was necessary. The abdominal wall was then sterilely prepped and draped.  A timeout was performed.    Local anesthetic (Marcaine) was infiltrated in the subumbilical region. A small subumbilical incision was made through the skin, subcutaneous tissue, fascia, and peritoneum entering the peritoneal cavity under direct vision. A pursestring suture of 0 Vicryl was placed around the edges of the fascia. A Hassan trocar was introduced into the peritoneal cavity and a pneumoperitoneum was created by insufflation of carbon dioxide gas. The laparoscope was introduced into the trocar and no underlying bleeding or organ injury was noted. He was then placed in the reverse Trendelenburg position with the right side tilted slightly up.  Three 5 mm trocars were then placed into the abdominal cavity under laparoscopic vision. One in the epigastric area, and 2 in the right upper quadrant area. The gallbladder was visualized and the fundus was grasped and retracted toward the right shoulder.  The infundibulum was mobilized with dissection close to the gallbladder and retracted laterally. The cystic duct was identified and a window was created around it. The cystic artery was also identified and a window was created around it. The critical view was achieved. A clip was placed at the  neck of the gallbladder. A small incision was made in the cystic duct. A cholangiocatheter was introduced through the anterior abdominal wall and placed in the cystic duct. A intraoperative cholangiogram was then performed.  Under real-time fluoroscopy, dilute contrast was injected into the cystic duct.  The common hepatic duct, the right and left hepatic ducts, and the common duct were all visualized. Contrast drained into the duodenum without obvious evidence of any obstructing ductal lesion. The final report is pending the Radiologist's interpretation.  The cholangiocatheter was removed, the cystic duct was clipped 3 times on the biliary side, and then the cystic duct was divided sharply. No bile leak was noted from the cystic duct stump.  The cystic artery was then clipped and divided. Following this the gallbladder was dissected free from the liver using electrocautery. Some leakage of bile occurred during the removal.  The gallbladder was then placed in a retrieval bag and removed from the abdominal cavity through the subumbilical incision.  The gallbladder fossa was inspected, irrigated, and bleeding was controlled with electrocautery. Inspection showed that hemostasis was adequate and there was no evidence of bile leak.  The irrigation fluid was evacuated as much as possible.  The subumbilical trocar was removed and the fascial defect was closed by tightening and tying down the pursestring suture under laparoscopic vision.  The remaining trocars were removed and the pneumoperitoneum was released. The skin incisions were closed with 4-0 Monocryl subcuticular stitches. Steri-Strips and sterile dressings were applied.  The procedure was well-tolerated without any apparent complications. He was taken to the recovery room in satisfactory condition.

## 2015-07-29 NOTE — Anesthesia Preprocedure Evaluation (Addendum)
Anesthesia Evaluation  Patient identified by MRN, date of birth, ID band Patient awake    Reviewed: Allergy & Precautions, NPO status , Patient's Chart, lab work & pertinent test results  History of Anesthesia Complications Negative for: history of anesthetic complications  Airway Mallampati: II  TM Distance: >3 FB Neck ROM: Full    Dental  (+) Teeth Intact, Dental Advisory Given   Pulmonary neg pulmonary ROS,    Pulmonary exam normal        Cardiovascular hypertension, + CAD and + CABG  Normal cardiovascular exam  2017 Myoview     The left ventricular ejection fraction is normal (55-65%).     Nuclear stress EF: 60%.     Blood pressure demonstrated a hypertensive response to exercise.     Upsloping ST segment depression ST segment depression was noted during stress in the III, II, aVF, V6, V5 and V4 leads.     No T wave inversion was noted during stress.     The study is normal.   Neuro/Psych negative neurological ROS  negative psych ROS   GI/Hepatic Neg liver ROS, GERD  ,  Endo/Other  negative endocrine ROS  Renal/GU negative Renal ROS     Musculoskeletal   Abdominal   Peds  Hematology   Anesthesia Other Findings   Reproductive/Obstetrics                           Anesthesia Physical Anesthesia Plan  ASA: III  Anesthesia Plan: General   Post-op Pain Management:    Induction: Intravenous  Airway Management Planned: Oral ETT  Additional Equipment:   Intra-op Plan:   Post-operative Plan: Extubation in OR  Informed Consent: I have reviewed the patients History and Physical, chart, labs and discussed the procedure including the risks, benefits and alternatives for the proposed anesthesia with the patient or authorized representative who has indicated his/her understanding and acceptance.   Dental advisory given  Plan Discussed with: CRNA and  Anesthesiologist  Anesthesia Plan Comments:         Anesthesia Quick Evaluation

## 2015-07-29 NOTE — Interval H&P Note (Signed)
History and Physical Interval Note:  07/29/2015 10:26 AM  [name]  has presented today for surgery, with the diagnosis of Symptomatic cholelithiasis  The various methods of treatment have been discussed with the patient and family. After consideration of risks, benefits and other options for treatment, the patient has consented to  Procedure(s): LAPAROSCOPIC CHOLECYSTECTOMY WITH INTRAOPERATIVE CHOLANGIOGRAM (N/A) as a surgical intervention .  The patient's history has been reviewed, patient examined, no change in status, stable for surgery.  I have reviewed the patient's chart and labs.  Questions were answered to the patient's satisfaction.     Lemario Chaikin Lenna Sciara

## 2015-07-29 NOTE — Discharge Instructions (Addendum)
LAPAROSCOPIC GALLBLADDER SURGERY: POST OP INSTRUCTIONS  1. DIET: Follow a liquid diet the first 48 hours after arrival home, such as soup, liquids, crackers, etc.  Be sure to include lots of fluids daily.  Avoid fast food or heavy meals as your are more likely to get nauseated.  Eat a low fat the next few days after surgery.   2. Take your usually prescribed home medications unless otherwise directed. 3. PAIN CONTROL: a. Pain is best controlled by a usual combination of three different methods TOGETHER: i. Ice/Heat ii. Over the counter pain medication iii. Prescription pain medication b. Most patients will experience some swelling and bruising around the incisions.  Ice packs or heating pads (30-60 minutes up to 6 times a day) will help. Use ice for the first few days to help decrease swelling and bruising, then switch to heat to help relax tight/sore spots and speed recovery.  Some people prefer to use ice alone, heat alone, alternating between ice & heat.  Experiment to what works for you.  Swelling and bruising can take several weeks to resolve.   c. It is helpful to take an over-the-counter pain medication regularly for the first few weeks.  Choose one of the following that works best for you: i. Naproxen (Aleve, etc)  Two 220mg  tabs twice a day ii. Ibuprofen (Advil, etc) Three 200mg  tabs four times a day (every meal & bedtime) iii. Acetaminophen (Tylenol, etc) 500-650mg  four times a day (every meal & bedtime) d. A  prescription for pain medication (such as oxycodone, hydrocodone, etc) should be given to you upon discharge.  Take your pain medication as prescribed.  i. If you are having problems/concerns with the prescription medicine (does not control pain, nausea, vomiting, rash, itching, etc), please call us [phone] to see if we need to switch you to a different pain medicine that will work better for you and/or control your side effect better. ii. If you need a refill on your pain  medication, please contact your pharmacy.  They will contact our office to request authorization. Prescriptions will not be filled after 5 pm or on week-ends. 4. Avoid getting constipated.  Between the surgery and the pain medications, it is common to experience some constipation.  Increasing fluid intake and taking a fiber supplement (such as Metamucil, Citrucel, FiberCon, MiraLax, etc) 1-2 times a day regularly will usually help prevent this problem from occurring.  A mild laxative (prune juice, Milk of Magnesia, MiraLax, etc) should be taken according to package directions if there are no bowel movements after 48 hours.   5. Watch out for diarrhea.  If you have many loose bowel movements, simplify your diet to bland foods & liquids for a few days.  Stop any stool softeners and decrease your fiber supplement.  Switching to mild anti-diarrheal medications (Kayopectate, Pepto Bismol) can help.  If this worsens or does not improve, please call us. 6. Wash / shower every day.  You may shower over the dressings as they are waterproof.  Continue to shower over incision(s) after the dressing is off. 7. Remove your waterproof bandages 3 days after surgery.  You may leave the incision open to air.  You may replace a dressing/Band-Aid to cover the incision for comfort if you wish.  8. ACTIVITIES as tolerated:   a. You may resume regular (light) daily activities beginning the next day--such as daily self-care, walking, climbing stairs--gradually increasing light activities as tolerated.  No heavy lifting (over 10 pounds), straining, or  intense activities for 2 weeks. b. DO NOT PUSH THROUGH PAIN.  Let pain be your guide: If it hurts to do something, don't do it.  Pain is your body warning you to avoid that activity for another week until the pain goes down. c. You may drive when you are no longer taking prescription pain medication, you can comfortably wear a seatbelt, and you can safely maneuver your car and apply  brakes. d. Dennis Bast may have sexual intercourse when it is comfortable.  9. FOLLOW UP in our office a. Please call [facility] at [phone] to set up an appointment to see your surgeon in the office for a follow-up appointment approximately 2-3 weeks after your surgery. b. Make sure that you call for this appointment the day you arrive home to insure a convenient appointment time. 10. IF YOU HAVE DISABILITY OR FAMILY LEAVE FORMS, BRING THEM TO THE OFFICE FOR PROCESSING.  DO NOT GIVE THEM TO YOUR DOCTOR.  11.  Return to work/school:  Desk work/light activities in 5-7 days, full duty/activities in 2 weeks if pain-free.   WHEN TO CALL us [phone]: 1. Poor pain control 2. Reactions / problems with new medications (rash/itching, nausea, etc)  3. Fever over 101.5 F (38.5 C) 4. Inability to urinate 5. Nausea and/or vomiting 6. Worsening swelling or bruising 7. Continued bleeding from incision. 8. Increased pain, redness, or drainage from the incision   The clinic staff is available to answer your questions during regular business hours (8:30am-5pm).  Please dont hesitate to call and ask to speak to one of our nurses for clinical concerns.   If you have a medical emergency, go to the nearest emergency room or call 911.  A surgeon from [facility] is always on call at the [facility], [address], [place], [place], [place]  [address] ? MAIN: [phone] ? TOLL FREE: [phone] ?  FAX [phone] [email or web address]  General Anesthesia, Adult, Care After Refer to this sheet in the next few weeks. These instructions provide you with information on caring for yourself after your procedure. Your health care provider may also give you more specific instructions. Your treatment has been planned according to current medical practices, but problems sometimes occur. Call your health care provider if you have any problems or questions  after your procedure. WHAT TO EXPECT AFTER THE PROCEDURE After the procedure, it is typical to experience:  Sleepiness.  Nausea and vomiting. HOME CARE INSTRUCTIONS  For the first 24 hours after general anesthesia:  Have a responsible person with you.  Do not drive a car. If you are alone, do not take public transportation.  Do not drink alcohol.  Do not take medicine that has not been prescribed by your health care provider.  Do not sign important papers or make important decisions.  You may resume a normal diet and activities as directed by your health care provider.  Change bandages (dressings) as directed.  If you have questions or problems that seem related to general anesthesia, call the hospital and ask for the anesthetist or anesthesiologist on call. SEEK MEDICAL CARE IF:  You have nausea and vomiting that continue the day after anesthesia.  You develop a rash. SEEK IMMEDIATE MEDICAL CARE IF:   You have difficulty breathing.  You have chest pain.  You have any allergic problems.   This information is not intended to replace advice given to you by your health care provider. Make sure  you discuss any questions you have with your health care provider.   Document Released: 04/02/2000 Document Revised: 01/15/2014 Document Reviewed: 04/25/2011 Elsevier Interactive Patient Education 2016 [place] have a patch used for nausea behind your left ear. Remove it 07/30/15  In the early afternoon. Use gloves or inverted ziplock bag to remove patch. Do not get ointment from patch on your skin. Wash skin where patch was located with paper towel, soap, and water. Do not let any animals or small children get to the patch.

## 2015-08-19 ENCOUNTER — Other Ambulatory Visit: Payer: Self-pay | Admitting: Cardiology

## 2015-08-19 DIAGNOSIS — E291 Testicular hypofunction: Secondary | ICD-10-CM | POA: Diagnosis not present

## 2015-08-26 DIAGNOSIS — Z85828 Personal history of other malignant neoplasm of skin: Secondary | ICD-10-CM | POA: Diagnosis not present

## 2015-08-26 DIAGNOSIS — D1801 Hemangioma of skin and subcutaneous tissue: Secondary | ICD-10-CM | POA: Diagnosis not present

## 2015-08-26 DIAGNOSIS — L570 Actinic keratosis: Secondary | ICD-10-CM | POA: Diagnosis not present

## 2015-08-26 DIAGNOSIS — L821 Other seborrheic keratosis: Secondary | ICD-10-CM | POA: Diagnosis not present

## 2015-08-26 DIAGNOSIS — D485 Neoplasm of uncertain behavior of skin: Secondary | ICD-10-CM | POA: Diagnosis not present

## 2015-09-22 IMAGING — US US EXTREM LOW VENOUS*L*
1 series · 14 of 24 positions shown · non-contrast
Comparison: None

CLINICAL DATA: Pain and swelling.

EXAM:
LEFT LOWER EXTREMITY VENOUS DOPPLER ULTRASOUND
TECHNIQUE: Gray-scale sonography with compression, as well as color and duplex
ultrasound, were performed to evaluate the deep venous system from
the level of the common femoral vein through the popliteal and
proximal calf veins.

[Series 1: us extrem low venous*left* · 14 of 31 slices shown]
[im 1/31]
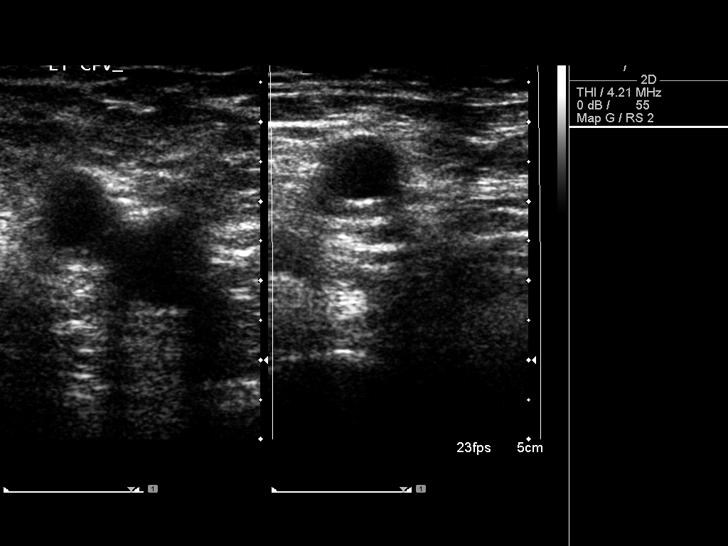
[im 3/31]
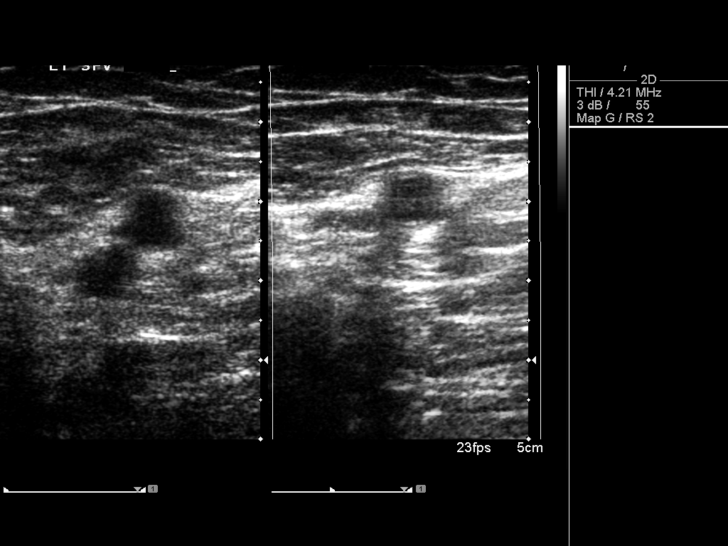
[im 6/31]
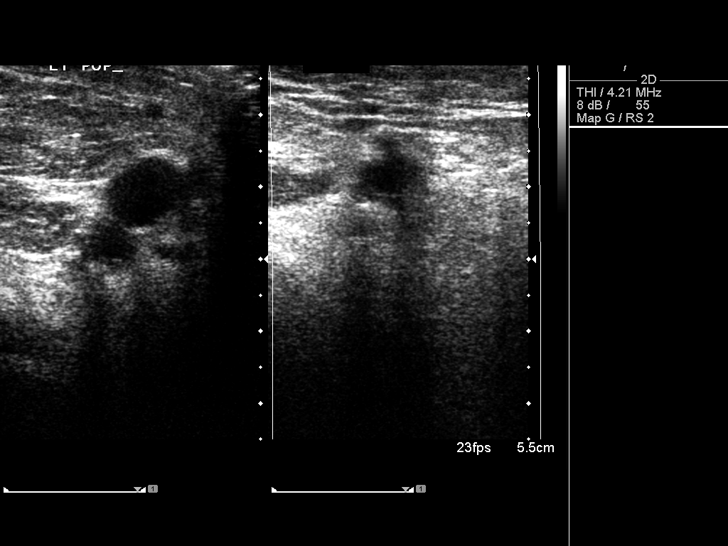
[im 8/31]
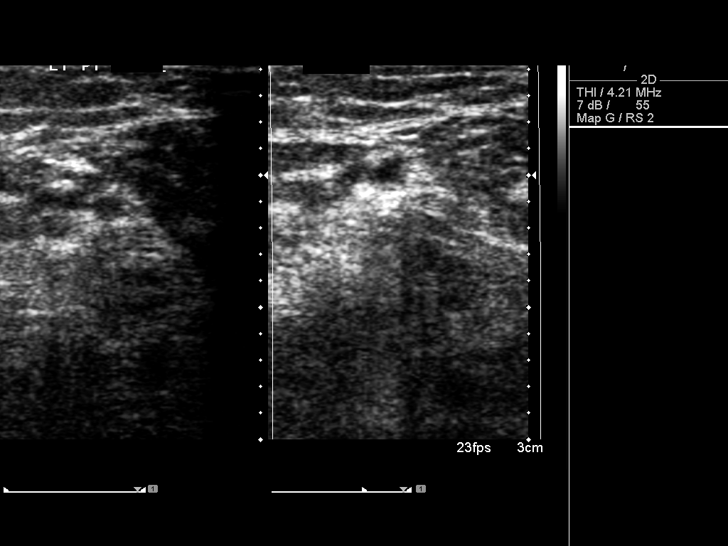
[im 10/31]
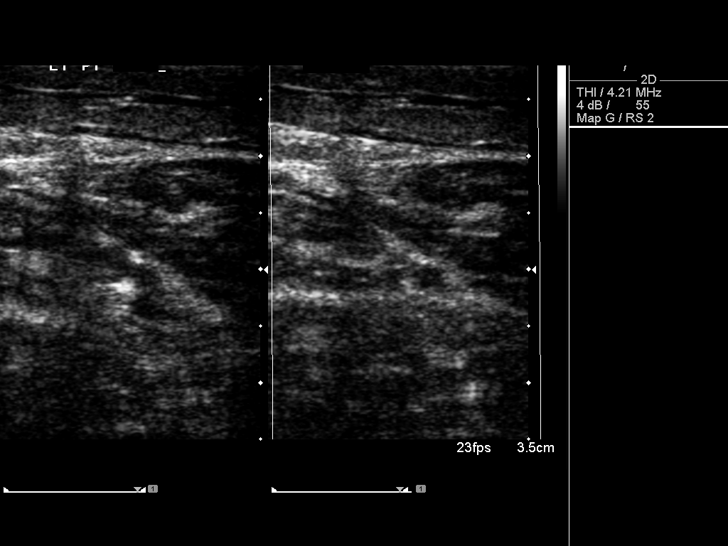
[im 12/31]
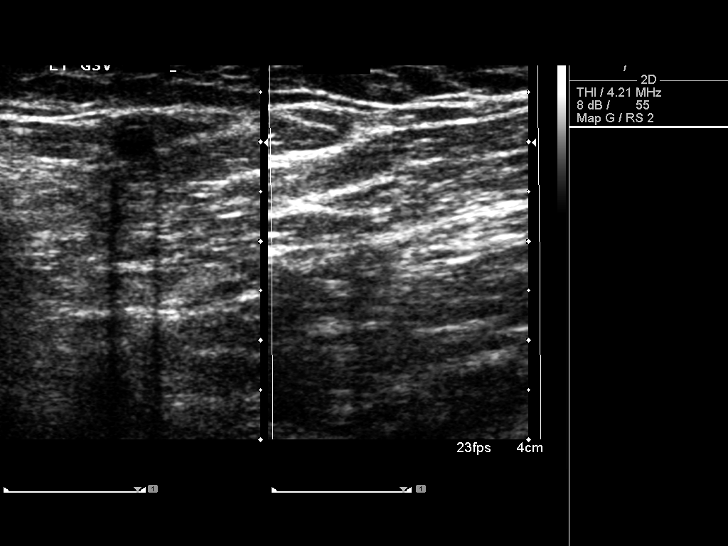
[im 15/31]
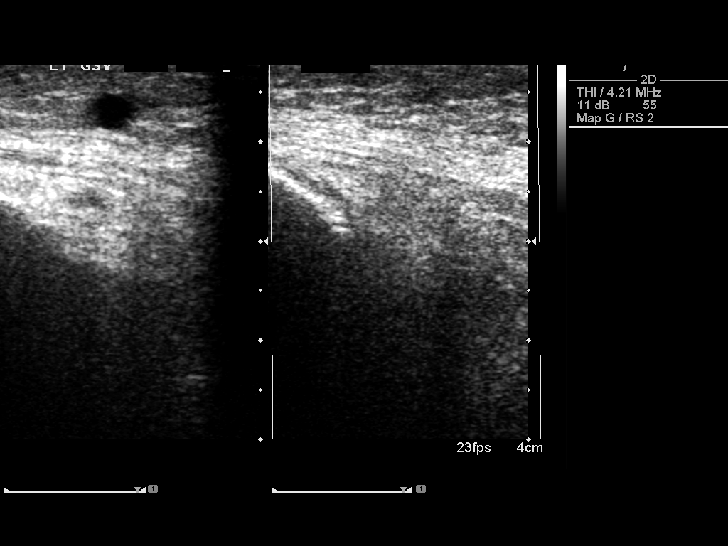
[im 16/31]
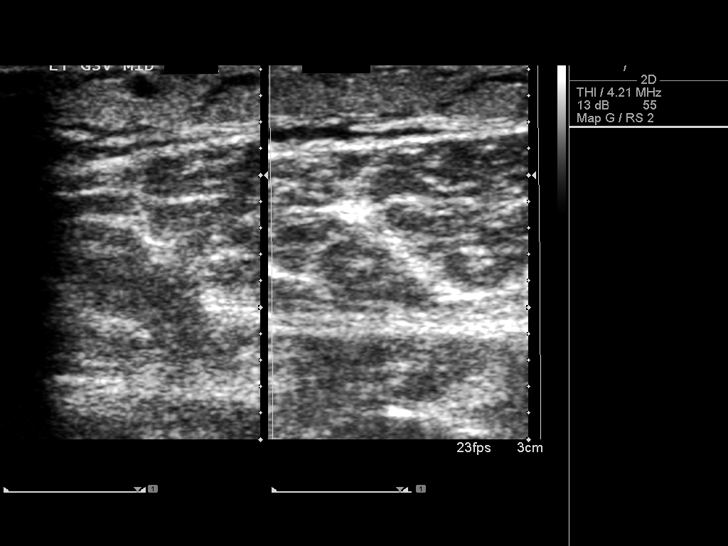
[im 19/31]
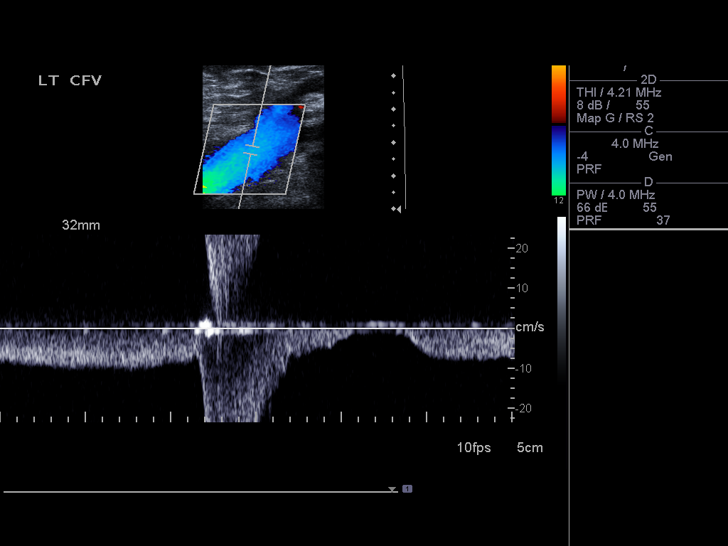
[im 21/31]
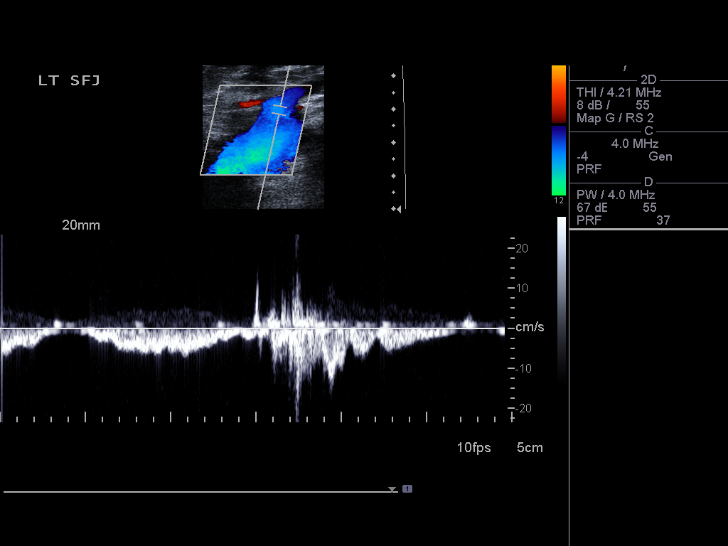
[im 24/31]
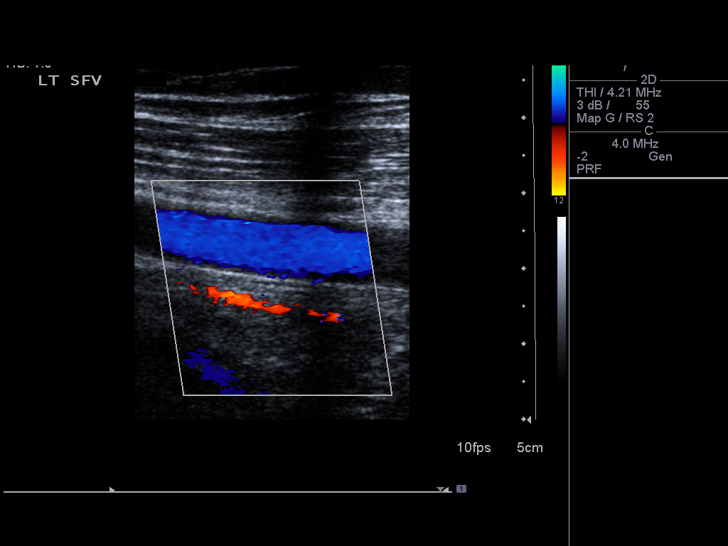
[im 25/31]
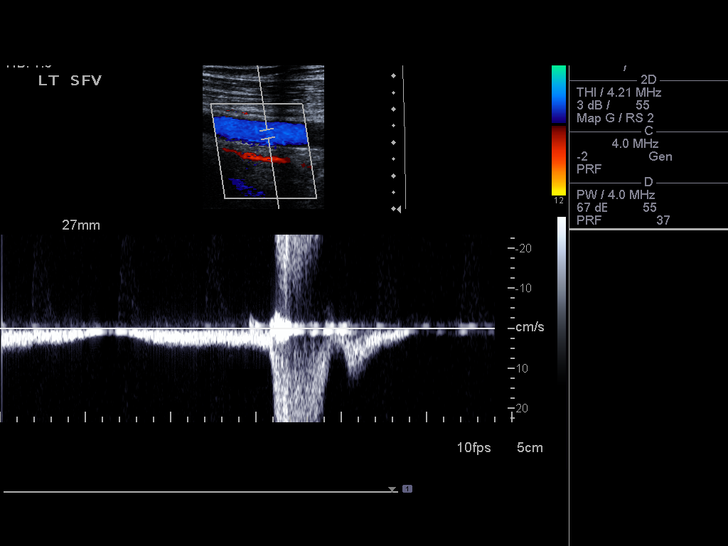
[im 28/31]
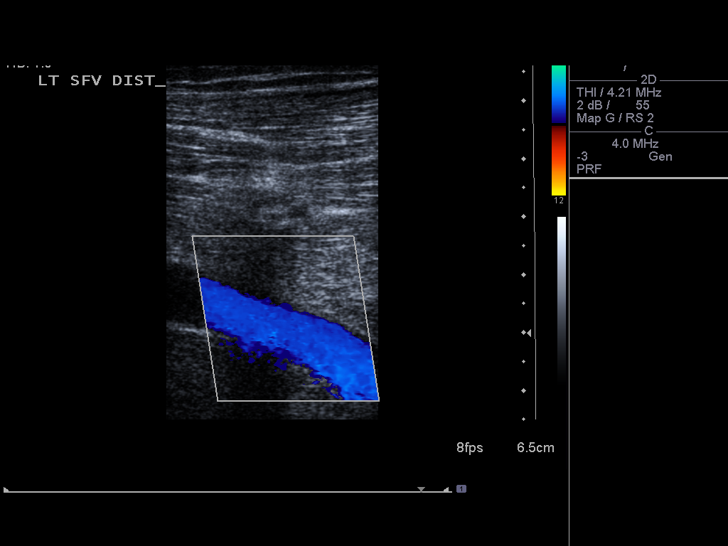
[im 31/31]
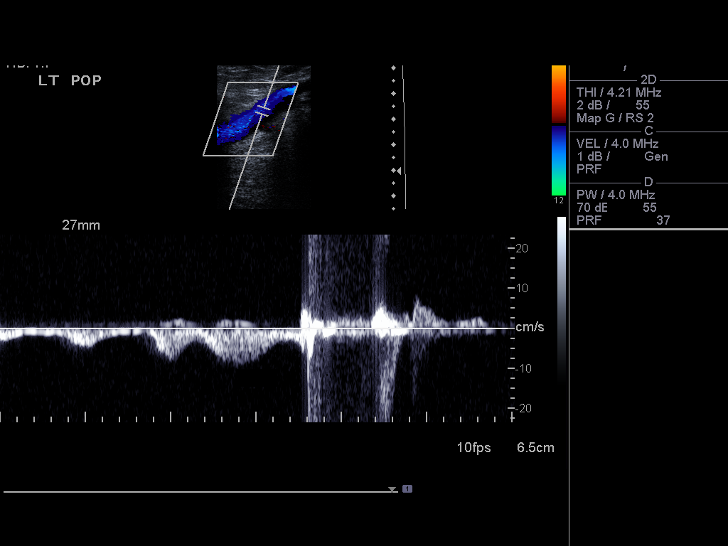

[14 of 24 positions shown; findings below may reference images not displayed]

FINDINGS: Normal compressibility of the common femoral, superficial femoral,
and popliteal veins, as well as the proximal calf veins. No filling
defects to suggest DVT on grayscale or color Doppler imaging.
Doppler waveforms show normal direction of venous flow, normal
respiratory phasicity and response to augmentation. Visualized
segments of greater saphenous vein normal in caliber and
compressibility.
IMPRESSION: No evidence of  lower extremity deep vein thrombosis.

## 2015-09-29 DIAGNOSIS — E7800 Pure hypercholesterolemia, unspecified: Secondary | ICD-10-CM | POA: Diagnosis not present

## 2015-09-29 DIAGNOSIS — I10 Essential (primary) hypertension: Secondary | ICD-10-CM | POA: Diagnosis not present

## 2015-10-04 DIAGNOSIS — I10 Essential (primary) hypertension: Secondary | ICD-10-CM | POA: Diagnosis not present

## 2015-10-04 DIAGNOSIS — E291 Testicular hypofunction: Secondary | ICD-10-CM | POA: Diagnosis not present

## 2015-10-04 DIAGNOSIS — M2550 Pain in unspecified joint: Secondary | ICD-10-CM | POA: Diagnosis not present

## 2015-10-04 DIAGNOSIS — M8580 Other specified disorders of bone density and structure, unspecified site: Secondary | ICD-10-CM | POA: Diagnosis not present

## 2015-10-04 DIAGNOSIS — Z23 Encounter for immunization: Secondary | ICD-10-CM | POA: Diagnosis not present

## 2015-10-04 DIAGNOSIS — Z8711 Personal history of peptic ulcer disease: Secondary | ICD-10-CM | POA: Diagnosis not present

## 2015-10-04 DIAGNOSIS — M791 Myalgia: Secondary | ICD-10-CM | POA: Diagnosis not present

## 2015-10-05 ENCOUNTER — Telehealth: Payer: Self-pay | Admitting: Cardiology

## 2015-10-05 DIAGNOSIS — R748 Abnormal levels of other serum enzymes: Secondary | ICD-10-CM

## 2015-10-05 DIAGNOSIS — Z79899 Other long term (current) drug therapy: Secondary | ICD-10-CM

## 2015-10-05 NOTE — Telephone Encounter (Signed)
Pt said he had his lab result sent over here today. He said his levels was off. He wants Dr Ellyn Hack to review this because,he said it is coming from his Crestor he thinks. Please call to advise him .

## 2015-10-05 NOTE — Telephone Encounter (Signed)
Returned call to patient. Notes he saw Dr. Shelia Media for bloodwork including a CK yesterday. Results came back this AM showing the CK level was elevated at 315. Informs me they faxed results to our office - I have looked and not been able to locate these.  Notes he had this drawn before in context of atorvastatin - I reviewed and in March 2016 this was in normal range. He takes crestor 5mg  daily. Pt would like to know if he needs to make adjustment to medication. No other symptoms or problems reported at this time. Pt aware I will route to MD to review.

## 2015-10-10 NOTE — Telephone Encounter (Signed)
Just saw the labs. Usually a CK of 3:15 is not a reason to stop a statin.  What I recommend doing, is on Crestor for a couple weeks, and then take it every other day. We can then recheck labs in 3 months to see where the CK is.  Glenetta Hew, MD

## 2015-10-11 NOTE — Telephone Encounter (Signed)
Spoke to patient. Information given.  patient states he has stopped crestor @ week ago , but he will restart today. Patient states he will take the medication for 2 weeks then reduce to every other day as suggested.  Patient states that Dr Shelia Media wanted him to see a pharmacist at Dr Pennie Banter office to discuss his statin issues, but patient decline. Patient said he would like Dr Ellyn Hack to take care of the issue if needed. RN informed patient that Dr Ellyn Hack would like patient to have labs rechecked in 3 months, if needed will probably follow up with an appointment [facility].  patient verbalized understanding

## 2015-10-21 DIAGNOSIS — R972 Elevated prostate specific antigen [PSA]: Secondary | ICD-10-CM | POA: Diagnosis not present

## 2015-10-21 NOTE — Telephone Encounter (Signed)
Agreed -- we can have him follow in [facility] if necessary.  Glenetta Hew, MD

## 2015-10-25 DIAGNOSIS — R972 Elevated prostate specific antigen [PSA]: Secondary | ICD-10-CM | POA: Diagnosis not present

## 2015-10-25 DIAGNOSIS — E291 Testicular hypofunction: Secondary | ICD-10-CM | POA: Diagnosis not present

## 2015-11-15 DIAGNOSIS — H53451 Other localized visual field defect, right eye: Secondary | ICD-10-CM | POA: Diagnosis not present

## 2015-11-15 DIAGNOSIS — H04123 Dry eye syndrome of bilateral lacrimal glands: Secondary | ICD-10-CM | POA: Diagnosis not present

## 2015-11-15 DIAGNOSIS — H01009 Unspecified blepharitis unspecified eye, unspecified eyelid: Secondary | ICD-10-CM | POA: Diagnosis not present

## 2015-11-15 DIAGNOSIS — H25811 Combined forms of age-related cataract, right eye: Secondary | ICD-10-CM | POA: Diagnosis not present

## 2015-12-26 DIAGNOSIS — L814 Other melanin hyperpigmentation: Secondary | ICD-10-CM | POA: Diagnosis not present

## 2015-12-26 DIAGNOSIS — Z85828 Personal history of other malignant neoplasm of skin: Secondary | ICD-10-CM | POA: Diagnosis not present

## 2015-12-26 DIAGNOSIS — L570 Actinic keratosis: Secondary | ICD-10-CM | POA: Diagnosis not present

## 2015-12-26 DIAGNOSIS — L308 Other specified dermatitis: Secondary | ICD-10-CM | POA: Diagnosis not present

## 2015-12-26 DIAGNOSIS — D1801 Hemangioma of skin and subcutaneous tissue: Secondary | ICD-10-CM | POA: Diagnosis not present

## 2015-12-26 DIAGNOSIS — L718 Other rosacea: Secondary | ICD-10-CM | POA: Diagnosis not present

## 2015-12-26 DIAGNOSIS — L821 Other seborrheic keratosis: Secondary | ICD-10-CM | POA: Diagnosis not present

## 2016-01-05 ENCOUNTER — Telehealth: Payer: Self-pay | Admitting: *Deleted

## 2016-01-05 DIAGNOSIS — R748 Abnormal levels of other serum enzymes: Secondary | ICD-10-CM

## 2016-01-05 DIAGNOSIS — Z79899 Other long term (current) drug therapy: Secondary | ICD-10-CM

## 2016-01-05 NOTE — Telephone Encounter (Signed)
Mailed letter and lab slip for "CK"

## 2016-01-05 NOTE — Telephone Encounter (Signed)
-----   Message from Raiford Simmonds, RN sent at 10/11/2015  8:46 AM EDT ----- Needs ck level mailed Recheck  After 3 months

## 2016-02-01 DIAGNOSIS — Z79899 Other long term (current) drug therapy: Secondary | ICD-10-CM | POA: Diagnosis not present

## 2016-02-01 DIAGNOSIS — R748 Abnormal levels of other serum enzymes: Secondary | ICD-10-CM | POA: Diagnosis not present

## 2016-02-02 ENCOUNTER — Telehealth: Payer: Self-pay | Admitting: *Deleted

## 2016-02-02 DIAGNOSIS — E785 Hyperlipidemia, unspecified: Secondary | ICD-10-CM

## 2016-02-02 DIAGNOSIS — R748 Abnormal levels of other serum enzymes: Secondary | ICD-10-CM

## 2016-02-02 DIAGNOSIS — Z79899 Other long term (current) drug therapy: Secondary | ICD-10-CM

## 2016-02-02 LAB — CK: CK TOTAL: 1087 U/L — AB (ref 7–232)

## 2016-02-02 NOTE — Telephone Encounter (Signed)
-----   Message from Leonie Man, MD sent at 02/02/2016  7:20 AM EST ----- With that level of CK- we need to stop Statin (& fibrate if taking). Will need up-to-date lipid panel. -- can refer to [facility] to discuss Repatha/Praluent  [facility]

## 2016-02-02 NOTE — Telephone Encounter (Signed)
Left message to call back  in regards to lab results RN spoke to  [name], [name] pharmacist- stop medication for a month  crestor   Continue with fish oil , CoQ10  Recheck  Ck and lipid panel - and appointment with [facility] -discuss lipids

## 2016-02-02 NOTE — Telephone Encounter (Signed)
Spoke to patient   information given about  Lab results - CK  PATIENT AWARE TO STOP CRESTOR  RECHECK  CK AND LIPIDS CHECKED 1 MONTH.  APPOINTMENT schedule with [facility]  To discuss lipid options.  mailed lab slip

## 2016-02-03 NOTE — Telephone Encounter (Signed)
PATIENT WILL ALSO HAVE CMP IN A MONTH

## 2016-03-05 DIAGNOSIS — Z79899 Other long term (current) drug therapy: Secondary | ICD-10-CM | POA: Diagnosis not present

## 2016-03-05 DIAGNOSIS — R748 Abnormal levels of other serum enzymes: Secondary | ICD-10-CM | POA: Diagnosis not present

## 2016-03-05 DIAGNOSIS — E785 Hyperlipidemia, unspecified: Secondary | ICD-10-CM | POA: Diagnosis not present

## 2016-03-05 LAB — COMPREHENSIVE METABOLIC PANEL
ALK PHOS: 55 U/L (ref 40–115)
ALT: 21 U/L (ref 9–46)
AST: 23 U/L (ref 10–35)
Albumin: 4.2 g/dL (ref 3.6–5.1)
BILIRUBIN TOTAL: 0.8 mg/dL (ref 0.2–1.2)
BUN: 14 mg/dL (ref 7–25)
CO2: 30 mmol/L (ref 20–31)
Calcium: 9.2 mg/dL (ref 8.6–10.3)
Chloride: 99 mmol/L (ref 98–110)
Creat: 1.27 mg/dL — ABNORMAL HIGH (ref 0.70–1.18)
GLUCOSE: 88 mg/dL (ref 65–99)
POTASSIUM: 4.2 mmol/L (ref 3.5–5.3)
SODIUM: 138 mmol/L (ref 135–146)
TOTAL PROTEIN: 6.3 g/dL (ref 6.1–8.1)

## 2016-03-05 LAB — LIPID PANEL
Cholesterol: 211 mg/dL — ABNORMAL HIGH (ref <200)
HDL: 34 mg/dL — AB (ref >40)
LDL CALC: 136 mg/dL — AB (ref <100)
Total CHOL/HDL Ratio: 6.2 Ratio — ABNORMAL HIGH (ref <5.0)
Triglycerides: 203 mg/dL — ABNORMAL HIGH (ref <150)
VLDL: 41 mg/dL — ABNORMAL HIGH (ref <30)

## 2016-03-05 LAB — CK: CK TOTAL: 233 U/L — AB (ref 7–232)

## 2016-03-16 ENCOUNTER — Ambulatory Visit (INDEPENDENT_AMBULATORY_CARE_PROVIDER_SITE_OTHER): Payer: Medicare Other | Admitting: Pharmacist Clinician (PhC)/ Clinical Pharmacy Specialist

## 2016-03-16 ENCOUNTER — Encounter: Payer: Self-pay | Admitting: Pharmacist Clinician (PhC)/ Clinical Pharmacy Specialist

## 2016-03-16 DIAGNOSIS — E784 Other hyperlipidemia: Secondary | ICD-10-CM | POA: Diagnosis not present

## 2016-03-16 DIAGNOSIS — E785 Hyperlipidemia, unspecified: Secondary | ICD-10-CM | POA: Diagnosis not present

## 2016-03-16 MED ORDER — EZETIMIBE 10 MG PO TABS: 10.0000 mg | ORAL_TABLET | Freq: Every day | ORAL | 0 refills | Status: DC

## 2016-03-16 NOTE — Assessment & Plan Note (Addendum)
Patient with elevated LDL, was tolerating rosuvastatin, however led to elevated CK, almost 5 x UNL.   Now without statin his LDL is 136.  We reviewed option of PCSK-9 inhibitor, patient did not like price, not willing to try at this time.  Will start him on ezetimibe.   Becasue there is a low risk of elevated CK, will repeat that lab in 4 weeks.  He will start with 5 mg daily for the first couple of weeks, as his wife recalls that when he took this previously, it caused him to have excessive bloating and GI irritation.  If he tolerates the lower dose and his CK levels look good, he can continue on 10 mg daily.  Will repeat lipid labs in 3 months.  Also reviewed option of CLEAR trial, he will give this some thought.

## 2016-03-16 NOTE — Patient Instructions (Signed)
Will start you on ezetimibe.  Will have you start with 1/2 tablet (5 mg) daily for the first 2-3 weeks.  If you do well with that then increase the dose to 1 tablet daily (10 mg).  If you develop any gastric side effects on this please give [place] a call.  Kristin/Raquel at [phone].    We can then look at possible cholesterol studies if this doesn't work.     Cholesterol Cholesterol is a fat. Your body needs a small amount of cholesterol. Cholesterol (plaque) may build up in your blood vessels (arteries). That makes you more likely to have a heart attack or stroke. You cannot feel your cholesterol level. Having a blood test is the only way to find out if your level is high. Keep your test results. Work with your doctor to keep your cholesterol at a good level. What do the results mean?  Total cholesterol is how much cholesterol is in your blood.  LDL is bad cholesterol. This is the type that can build up. Try to have low LDL.  HDL is good cholesterol. It cleans your blood vessels and carries LDL away. Try to have high HDL.  Triglycerides are fat that the body can store or burn for energy. What are good levels of cholesterol?  Total cholesterol below 200.  LDL below 100 is good for people who have health risks. LDL below 70 is good for people who have very high risks.  HDL above 40 is good. It is best to have HDL of 60 or higher.  Triglycerides below 150. How can I lower my cholesterol? Diet  Follow your diet program as told by your doctor.  Choose fish, white meat chicken, or [place] that is roasted or baked. Try not to eat red meat, fried foods, sausage, or lunch meats.  Eat lots of fresh fruits and vegetables.  Choose whole grains, beans, pasta, potatoes, and cereals.  Choose olive oil, corn oil, or canola oil. Only use small amounts.  Try not to eat butter, mayonnaise, shortening, or palm kernel oils.  Try not to eat foods with trans fats.  Choose low-fat or nonfat  dairy foods.  Drink skim or nonfat milk.  Eat low-fat or nonfat yogurt and cheeses.  Try not to drink whole milk or cream.  Try not to eat ice cream, egg yolks, or full-fat cheeses.  Healthy desserts include angel food cake, ginger snaps, animal crackers, hard candy, popsicles, and low-fat or nonfat frozen yogurt. Try not to eat pastries, cakes, pies, and cookies. Exercise  Follow your exercise program as told by your doctor.  Be more active. Try gardening, walking, and taking the stairs.  Ask your doctor about ways that you can be more active. Medicine  Take over-the-counter and prescription medicines only as told by your doctor. This information is not intended to replace advice given to you by your health care provider. Make sure you discuss any questions you have with your health care provider. Document Released: 03/23/2008 Document Revised: 07/27/2015 Document Reviewed: 07/07/2015 Elsevier Interactive Patient Education  2017 [employer].

## 2016-03-16 NOTE — Progress Notes (Signed)
03/16/2016 GRAVES NIPP Dec 04, 1945 [id number]   HPI:  [name] is a 71 y.o. male patient of Dr Ellyn Hack who presents today for a lipid clinic evaluation.  He was previously taking Crestor 10 mg daily, however a recent CK was shown to be almost 5 times the upper limit of normal.  Crestor was discontinued.    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  He has been doing well since then, reports no CP, SOB or dizziness/lightheadedness.  He is frustrated that he cannot take rosuvastatin, as he felt no side effects.    Current Medications:  None  Cholesterol Goals:  LDL < 70   Intolerant/previously tried:  Atorvastatin (10+ years ago), cause myalgias and elevated CK  Rosuvastatin 20 mg to 5 mg qod, all caused elevated CK  Family history:   Paternal grandfather had heart failure, 1 brother and 1 son, both with elevated cholesterol  Diet:   Believes diet to be very healthy, lots of salads and fresh fruits/vegetables; avoids high fat or high cholesterol foods, limits his carbohydrates; occasional red meat, but mostly chicken  Exercise:    None recently, has gym membership, but has not been going Labs:   02/2016:  TC 211, TC 203, HDL 34, LDL 136 (no meds)  Current Outpatient Prescriptions  Medication Sig Dispense Refill  . acetaminophen (TYLENOL) 325 MG tablet Take 650 mg by mouth. Take 2  Daily as needed    . Azelaic Acid (FINACEA) 15 % cream Apply topically as needed. After skin is thoroughly washed and patted dry, gently but thoroughly massage a thin film of azelaic acid cream into the affected area twice daily, in the morning and evening.     . Calcium Carbonate-Vitamin D (CALCIUM 600+D) 600-400 MG-UNIT per tablet Take 1 tablet by mouth daily.      . cetirizine (ZYRTEC) 10 MG tablet Take 10 mg by mouth daily.    . Coenzyme Q10 (CO Q-10) 100 MG CAPS Take 1 tablet by mouth daily.     [name] doxycycline (VIBRAMYCIN) 100 MG capsule Take 100 mg by mouth daily as needed (rosacea).      . ezetimibe (ZETIA) 10 MG tablet Take 1 tablet (10 mg total) by mouth daily. 30 tablet 0  . Fish Oil OIL Take 1,200 mg by mouth daily.     . meclizine (ANTIVERT) 25 MG tablet Take 25 mg by mouth 2 (two) times daily as needed for dizziness.    . metoprolol tartrate (LOPRESSOR) 25 MG tablet TAKE 1 TABLET EVERY MORNING AND TAKE ONE-HALF (1/2) TABLET EVERY EVENING 135 tablet 3  . ondansetron (ZOFRAN) 4 MG tablet Take 1 tablet (4 mg total) by mouth every 4 (four) hours as needed for nausea. 20 tablet 0  . oxyCODONE (OXY IR/ROXICODONE) 5 MG immediate release tablet Take 1-2 tablets (5-10 mg total) by mouth every 4 (four) hours as needed for moderate pain, severe pain or breakthrough pain. 30 tablet 0  . pantoprazole (PROTONIX) 40 MG tablet Take 40 mg by mouth daily.      . Pediatric Multiple Vit-C-FA (PEDIATRIC MULTIVITAMIN) chewable tablet Chew 1 tablet by mouth daily.    . sildenafil (REVATIO) 20 MG tablet Take 60 mg by mouth daily.     [name] testosterone cypionate (DEPOTESTOTERONE CYPIONATE) 100 MG/ML injection Inject 100 mg into the muscle every 14 (fourteen) days.    . valsartan-hydrochlorothiazide (DIOVAN-HCT) 320-12.5 MG per tablet Take 0.05 tablets by mouth daily.  No current facility-administered medications for this visit.     Allergies  Allergen Reactions  . Cephalosporins Palpitations  . Triamterene-Hctz Palpitations  . Dexamethasone   . Statins Other (See Comments)    " elevated muscle enzymes"  . Vioxx [Rofecoxib] Swelling    Facial swelling  . Zetia [Ezetimibe]     Increased muscle enzymes  . Lisinopril Cough  . Sulfa Antibiotics Rash    Past Medical History:  Diagnosis Date  . Arthritis   . CAD in native artery August 2012   Cardiac catheterization for abnormal treadmill portion of Myoview stress test -with diffuse market ST depressions in the inferior leads with ST elevations in V1 and V2; no ischemia or infarction on images --> 70% distal left main, 90% ostial LAD  with 60-70% lesions in proximal. Minimal RCA or circumflex disease. Subtotal proximal rPDA: Referred for CABG  . Cancer (HCC)    skin cancers-squamous cell / facial   . Dyslipidemia - low HDL    Lipids monitored by Dr. Shelia Media.  [name] GERD (gastroesophageal reflux disease)   . History of multiple allergies   . History of vertigo   . Hypertension    Labile  . Low testosterone    Recently stopped replacement therapy.  [name] Post-nasal drip   . 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  . Urinary incontinence, urge   . Wears glasses     There were no vitals taken for this visit.   Dyslipidemia (high LDL; low HDL) Patient with elevated LDL, was tolerating rosuvastatin, however led to elevated CK, almost 5 x UNL.   Now without statin his LDL is 136.  We reviewed option of PCSK-9 inhibitor, patient did not like price, not willing to try at this time.  Will start him on ezetimibe.   Becasue there is a low risk of elevated CK, will repeat that lab in 4 weeks.  He will start with 5 mg daily for the first couple of weeks, as his wife recalls that when he took this previously, it caused him to have excessive bloating and GI irritation.  If he tolerates the lower dose and his CK levels look good, he can continue on 10 mg daily.  Will repeat lipid labs in 3 months.  Also reviewed option of CLEAR trial, he will give this some thought.     Tommy Medal PharmD CPP [facility]

## 2016-03-19 ENCOUNTER — Other Ambulatory Visit: Payer: Self-pay | Admitting: Pharmacist Clinician (PhC)/ Clinical Pharmacy Specialist

## 2016-03-19 MED ORDER — EZETIMIBE 10 MG PO TABS: 10.0000 mg | ORAL_TABLET | Freq: Every day | ORAL | 0 refills | Status: DC

## 2016-04-03 DIAGNOSIS — R748 Abnormal levels of other serum enzymes: Secondary | ICD-10-CM | POA: Diagnosis not present

## 2016-04-03 DIAGNOSIS — I10 Essential (primary) hypertension: Secondary | ICD-10-CM | POA: Diagnosis not present

## 2016-04-03 DIAGNOSIS — M810 Age-related osteoporosis without current pathological fracture: Secondary | ICD-10-CM | POA: Diagnosis not present

## 2016-04-03 DIAGNOSIS — I2510 Atherosclerotic heart disease of native coronary artery without angina pectoris: Secondary | ICD-10-CM | POA: Diagnosis not present

## 2016-04-03 DIAGNOSIS — M2550 Pain in unspecified joint: Secondary | ICD-10-CM | POA: Diagnosis not present

## 2016-04-03 DIAGNOSIS — E7800 Pure hypercholesterolemia, unspecified: Secondary | ICD-10-CM | POA: Diagnosis not present

## 2016-04-10 DIAGNOSIS — R748 Abnormal levels of other serum enzymes: Secondary | ICD-10-CM | POA: Diagnosis not present

## 2016-05-04 DIAGNOSIS — E291 Testicular hypofunction: Secondary | ICD-10-CM | POA: Diagnosis not present

## 2016-05-21 DIAGNOSIS — R748 Abnormal levels of other serum enzymes: Secondary | ICD-10-CM | POA: Diagnosis not present

## 2016-05-21 DIAGNOSIS — E7800 Pure hypercholesterolemia, unspecified: Secondary | ICD-10-CM | POA: Diagnosis not present

## 2016-05-21 DIAGNOSIS — I2510 Atherosclerotic heart disease of native coronary artery without angina pectoris: Secondary | ICD-10-CM | POA: Diagnosis not present

## 2016-05-25 DIAGNOSIS — E291 Testicular hypofunction: Secondary | ICD-10-CM | POA: Diagnosis not present

## 2016-05-28 DIAGNOSIS — I2510 Atherosclerotic heart disease of native coronary artery without angina pectoris: Secondary | ICD-10-CM | POA: Diagnosis not present

## 2016-05-28 DIAGNOSIS — R748 Abnormal levels of other serum enzymes: Secondary | ICD-10-CM | POA: Diagnosis not present

## 2016-06-05 ENCOUNTER — Telehealth: Payer: Self-pay | Admitting: Pharmacist Clinician (PhC)/ Clinical Pharmacy Specialist

## 2016-06-05 DIAGNOSIS — E785 Hyperlipidemia, unspecified: Secondary | ICD-10-CM

## 2016-06-05 NOTE — Telephone Encounter (Signed)
Patient due for lipid labs, was prescribed ezetimibe in March.   Will mail lab order.

## 2016-06-18 DIAGNOSIS — B029 Zoster without complications: Secondary | ICD-10-CM | POA: Diagnosis not present

## 2016-06-18 DIAGNOSIS — B0229 Other postherpetic nervous system involvement: Secondary | ICD-10-CM | POA: Diagnosis not present

## 2016-06-25 DIAGNOSIS — E7800 Pure hypercholesterolemia, unspecified: Secondary | ICD-10-CM | POA: Diagnosis not present

## 2016-06-25 DIAGNOSIS — I2510 Atherosclerotic heart disease of native coronary artery without angina pectoris: Secondary | ICD-10-CM | POA: Diagnosis not present

## 2016-06-25 DIAGNOSIS — R748 Abnormal levels of other serum enzymes: Secondary | ICD-10-CM | POA: Diagnosis not present

## 2016-06-25 DIAGNOSIS — I10 Essential (primary) hypertension: Secondary | ICD-10-CM | POA: Diagnosis not present

## 2016-07-23 DIAGNOSIS — H04123 Dry eye syndrome of bilateral lacrimal glands: Secondary | ICD-10-CM | POA: Diagnosis not present

## 2016-07-23 DIAGNOSIS — H01009 Unspecified blepharitis unspecified eye, unspecified eyelid: Secondary | ICD-10-CM | POA: Diagnosis not present

## 2016-07-24 ENCOUNTER — Ambulatory Visit: Payer: Medicare Other | Admitting: Cardiology

## 2016-07-26 ENCOUNTER — Encounter: Payer: Self-pay | Admitting: Cardiology

## 2016-07-26 ENCOUNTER — Ambulatory Visit (INDEPENDENT_AMBULATORY_CARE_PROVIDER_SITE_OTHER): Payer: Medicare Other | Admitting: Cardiology

## 2016-07-26 ENCOUNTER — Telehealth: Payer: Self-pay | Admitting: Cardiology

## 2016-07-26 VITALS — BP 122/70 | HR 69 | Ht 70.0 in | Wt 180.8 lb

## 2016-07-26 DIAGNOSIS — I10 Essential (primary) hypertension: Secondary | ICD-10-CM | POA: Diagnosis not present

## 2016-07-26 DIAGNOSIS — I2510 Atherosclerotic heart disease of native coronary artery without angina pectoris: Secondary | ICD-10-CM | POA: Diagnosis not present

## 2016-07-26 DIAGNOSIS — E785 Hyperlipidemia, unspecified: Secondary | ICD-10-CM | POA: Diagnosis not present

## 2016-07-26 MED ORDER — ASPIRIN EC 81 MG PO TBEC: 81.0000 mg | DELAYED_RELEASE_TABLET | Freq: Every day | ORAL | 3 refills | Status: DC

## 2016-07-26 NOTE — Patient Instructions (Signed)
Medication Instructions: No changes   Follow-Up: Your physician wants you to follow-up in 12 months with Dr. Ellyn Hack. You will receive a reminder letter in the mail two months in advance. If you don't receive a letter, please call our office to schedule the follow-up appointment.    If you need a refill on your cardiac medications before your next appointment, please call your pharmacy.

## 2016-07-26 NOTE — Progress Notes (Signed)
PCP: Isaac Pretty, MD  Clinic Note: Chief Complaint  Patient presents with  . Follow-up    CAD-CABG(    HPI: [name] is a 71 y.o. male with a PMH below who presents today for delayed annual follow-up for CAD-CABG.  he was referred to me after treadmill stress test done in August 2012 that showed diffuse ST depressions with V1 and V2 ST elevations. I recommended cardiac catheterization which was performed by Dr Chase Picket - showed multivessel CAD and referred for CABG.  [name] was last seen on 06/28/2015 - was doing well without any major symptoms. Maybe some mild discomfort in the sternum from his sternal wires but otherwise doing fine. No angina.  Surveillance Myoview ordered. See below.  Recent Hospitalizations: - Laparoscopic cholecystectomy 07/29/2015  Studies Personally Reviewed - (if available, images/films reviewed: From Epic Chart or Care Everywhere)  Myoview 06/28/2015: Negative cardiac images. No ischemia or infarction. Upsloping ST-T wave changes in inferior and lateral leads. Nondiagnostic.  Interval History: [name] presents today overall doing fairly well. He does acknowledge that he is "slacking off a bit on his exercise routine. He still doing golfing, but has not been doing the same walking. He says that he got out of the habit when he had his surgery last year and has been dealing with a bad knee. He is still active and denies any chest tightness or pressure with rest or exertion. No PND, orthopnea or edema. He is back on Crestor now, and was switched from valsartan to losartan HCTZ for blood pressure control.  Essentially negative cardiac review of symptoms: No chest pain or shortness of breath with rest or exertion. No PND, orthopnea or edema. No palpitations, lightheadedness, dizziness, weakness or syncope/near syncope. No TIA/amaurosis fugax symptoms. No melena, hematochezia, hematuria, or epstaxis. No claudication.  ROS: A comprehensive was  performed. Review of Systems  Constitutional: Negative for weight loss.  HENT: Negative for congestion and nosebleeds.   Respiratory: Negative for cough, shortness of breath and wheezing.   Cardiovascular:       Per history of present illness  Gastrointestinal: Positive for heartburn. Negative for abdominal pain.  Genitourinary: Negative for frequency.  Musculoskeletal: Positive for joint pain (Both knees, right worse than left).  Neurological: Negative for focal weakness.  Endo/Heme/Allergies: Negative for environmental allergies. Does not bruise/bleed easily.  Psychiatric/Behavioral: Negative for depression and memory loss. The patient is not nervous/anxious and does not have insomnia.   All other systems reviewed and are negative.  I have reviewed and (if needed) personally updated the patient's problem list, medications, allergies, past medical and surgical history, social and family history.   Past Medical History:  Diagnosis Date  . Arthritis   . CAD in native artery August 2012   Cardiac catheterization for abnormal treadmill portion of Myoview stress test -with diffuse market ST depressions in the inferior leads with ST elevations in V1 and V2; no ischemia or infarction on images --> 70% distal left main, 90% ostial LAD with 60-70% lesions in proximal. Minimal RCA or circumflex disease. Subtotal proximal rPDA: Referred for CABG  . Cancer (HCC)    skin cancers-squamous cell / facial   . Dyslipidemia - low HDL    Lipids monitored by Dr. Shelia Media.  [name] GERD (gastroesophageal reflux disease)   . History of multiple allergies   . History of vertigo   . Hypertension    Labile  . Low testosterone    Recently stopped replacement therapy.  [name] Post-nasal  drip   . 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  . Urinary incontinence, urge   . Wears glasses     Past Surgical History:  Procedure Laterality Date  . arthroscopy of knees bilat     . CARDIAC  CATHETERIZATION  08/18/2010   Terminal left main 70%, ostial LAD 90%, sequential 60-70% lesion in the proximal LAD, mild circumflex lesions, but large OM1 and OM2. RCA had moderate irregularities and a subtotal proximal PDA  . CARDIOVASCULAR STRESS TEST  02/06/2011   EKG negative for ischemia. No significant wall motion abnormalities noted.  . CHOLECYSTECTOMY N/A 07/29/2015   Procedure: LAPAROSCOPIC CHOLECYSTECTOMY WITH INTRAOPERATIVE CHOLANGIOGRAM;  Surgeon: Jackolyn Confer, MD;  Location: [facility];  Service: General;  Laterality: N/A;  . CORONARY ARTERY BYPASS GRAFT  08/29/2010   x4. LIMA to LAD, SVG to diag, SVG to OM, SVG to PDA. Rt thigh vein harvest  . NM MYOVIEW LTD  06/27/2015   EF 60%. No ischemia or infarction. NORMAL STUDY.  . nodule on testicle     . PRE CABG DOPPLER  08/25/2010   No significant extracranial carotid artery stenosis. ECA stenosis noted bilaterally - ICA/CCA ratio id 0.9-rt and 0.74-lft  . skin cancer removed      times 3  . TONSILLECTOMY    . TRANSTHORACIC ECHOCARDIOGRAM  07/27/2010   EF >55%, mild concentric LVH, stage 1 diastolic dysfunction    Current Meds  Medication Sig  . acetaminophen (TYLENOL) 325 MG tablet Take 650 mg by mouth. Take 2  Daily as needed  . Azelaic Acid (FINACEA) 15 % cream Apply topically as needed. After skin is thoroughly washed and patted dry, gently but thoroughly massage a thin film of azelaic acid cream into the affected area twice daily, in the morning and evening.   . Calcium Carbonate-Vitamin D (CALCIUM 600+D) 600-400 MG-UNIT per tablet Take 1 tablet by mouth daily.    . cetirizine (ZYRTEC) 10 MG tablet Take 10 mg by mouth daily.  . Coenzyme Q10 (CO Q-10) 100 MG CAPS Take 1 tablet by mouth daily.   [name] doxycycline (VIBRAMYCIN) 100 MG capsule Take 100 mg by mouth daily as needed (rosacea).   . Fish Oil OIL Take 1,200 mg by mouth daily.   . metoprolol tartrate (LOPRESSOR) 25 MG tablet TAKE 1 TABLET EVERY MORNING AND TAKE ONE-HALF (1/2)  TABLET EVERY EVENING  . pantoprazole (PROTONIX) 40 MG tablet Take 40 mg by mouth daily.    . Pediatric Multiple Vit-C-FA (PEDIATRIC MULTIVITAMIN) chewable tablet Chew 1 tablet by mouth daily.  . rosuvastatin (CRESTOR) 5 MG tablet Take by mouth. Pt takes 2.5 mg daily at bedtime  . sildenafil (REVATIO) 20 MG tablet Take 60 mg by mouth daily.   [name] testosterone cypionate (DEPOTESTOTERONE CYPIONATE) 100 MG/ML injection Inject 100 mg into the muscle every 14 (fourteen) days.    Allergies  Allergen Reactions  . Cephalosporins Palpitations  . Triamterene-Hctz Palpitations  . Dexamethasone   . Statins Other (See Comments)    " elevated muscle enzymes"  . Vioxx [Rofecoxib] Swelling    Facial swelling  . Lisinopril Cough  . Sulfa Antibiotics Rash    Social History   Social History  . Marital status: Married    Spouse name: N/A  . Number of children: N/A  . Years of education: N/A   Social History Main Topics  . Smoking status: Never Smoker  . Smokeless tobacco: Former Systems developer    Types: Loss adjuster, chartered  Quit date: 01/08/2005  . Alcohol use No  . Drug use: No  . Sexual activity: Not Asked   Other Topics Concern  . None   Social History Narrative   He is a married father of 2.   He does not smoke or drink alcohol.   Up until recently when he hurt his left knee, he was continuing to exercise vigorously at least 2-3 times a week sometimes up to 2 hours of time doing cardiovascular and muscle strength exercises.    family history includes Cancer in his father; Heart disease in his brother and maternal grandfather; Heart failure in his brother.  Wt Readings from Last 3 Encounters:  07/26/16 180 lb 12.8 oz (82 kg)  07/29/15 180 lb 6 oz (81.8 kg)  07/19/15 180 lb 6 oz (81.8 kg)    PHYSICAL EXAM BP 122/70   Pulse 69   Ht 5\' 10"  (1.778 m)   Wt 180 lb 12.8 oz (82 kg)   BMI 25.94 kg/m  General appearance: alert, cooperative, appears stated age, no distress. Healthy-appearing. Well-nourished  well-groomed HEENT: [place]/AT, EOMI, MMM, anicteric sclera Neck: no adenopathy, no carotid bruit and no JVD Lungs: clear to auscultation bilaterally, normal percussion bilaterally and non-labored Heart: regular rate and rhythm, S1 &S2 normal, no murmur, click, rub or gallop; nondisplaced PMI Abdomen: soft, non-tender; bowel sounds normal; no masses,  no organomegaly; no HJR Extremities: extremities normal, atraumatic, no cyanosis, or edema  Pulses: 2+ and symmetric;  Neurologic: Mental status: Alert & oriented x 3, thought content appropriate; non-focal exam.  Pleasant mood & affect.   Adult ECG Report  Rate: 69 ;  Rhythm: normal sinus rhythm and Incomplete right bundle branch block. Nonspecific ST and T-wave changes. T-wave inversions much less notable;   Narrative Interpretation: Stable EKG   Other studies Reviewed: Additional studies/ records that were reviewed today include:  Recent Labs:  Labs were checked by PCP, but not currently available. He was told that his lipids were well controlled.   ASSESSMENT / PLAN: Problem List Items Addressed This Visit    CAD in native artery; Left Main and LAD and occluded RPDA; referred for CABG (Chronic)    No recurrent anginal symptoms. He never really did have angina. Nonischemic Myoview last year. Since he was not symptomatic in the past, I do think we need to do screening tests on him every 4 years or so. He has back on Crestor, and unstable low-dose metoprolol. Also now taking losartan-HCTZ. He tells me he was taking an aspirin daily, but is not listed on his medication list.      Relevant Medications   rosuvastatin (CRESTOR) 5 MG tablet   aspirin EC 81 MG tablet   Coronary artery disease involving native coronary artery of native heart without angina pectoris - Primary    Interesting, he did not have angina despite having significant CAD. Because of his lack of symptoms, we will continue to follow stress tests for surveillance.       Relevant Medications   rosuvastatin (CRESTOR) 5 MG tablet   aspirin EC 81 MG tablet   Other Relevant Orders   EKG 12-Lead (Completed)   Dyslipidemia (high LDL; low HDL) (Chronic)    Labs now again followed by Dr. Shelia Media. Back on Crestor. Don't have recent labs, but apparently they have been controlled. Target LDL is less than 70. In the past, there have been suggestion of possible using PCSK9 inhibitor.  He was recently seeing our pharmacy team here at  [facility], but decided to go back to Dr. Pennie Banter office because they started him on Zetia for elevated CK levels.  I have asked that they send copy of his labs       Relevant Medications   rosuvastatin (CRESTOR) 5 MG tablet   Hypertension - labile (Chronic)    Stable blood pressure on current regimen. For now will continue on. I did not have the losartan listed on his current medications for today's visit, this was updated after he went home.  No change for now. Monitored also by PCP.      Relevant Medications   rosuvastatin (CRESTOR) 5 MG tablet   aspirin EC 81 MG tablet   Other Relevant Orders   EKG 12-Lead (Completed)      Current medicines are reviewed at length with the patient today. (+/- concerns) n/a The following changes have been made: n/a  Patient Instructions  Medication Instructions: No changes   Follow-Up: Your physician wants you to follow-up in 12 months with Dr. Ellyn Hack. You will receive a reminder letter in the mail two months in advance. If you don't receive a letter, please call our office to schedule the follow-up appointment.    If you need a refill on your cardiac medications before your next appointment, please call your pharmacy.     Studies Ordered:   Orders Placed This Encounter  Procedures  . EKG 12-Lead      Glenetta Hew, M.D., M.S. Interventional Cardiologist   Pager # [phone] Phone # [phone] [address] [place], [place] [address]

## 2016-07-26 NOTE — Telephone Encounter (Signed)
New message    Pt is calling to report his blood pressure medicine is Losartan-HCTZ tabs 100-12.5 mg.

## 2016-07-27 NOTE — Telephone Encounter (Signed)
The medication has been added to his list as stated by the patient.

## 2016-07-27 NOTE — Telephone Encounter (Signed)
Thanks  ->  David Harding, MD  

## 2016-07-28 ENCOUNTER — Encounter: Payer: Self-pay | Admitting: Cardiology

## 2016-07-28 NOTE — Assessment & Plan Note (Signed)
Labs now again followed by Dr. Shelia Media. Back on Crestor. Don't have recent labs, but apparently they have been controlled. Target LDL is less than 70. In the past, there have been suggestion of possible using PCSK9 inhibitor.  He was recently seeing our pharmacy team here at [facility], but decided to go back to Dr. Pennie Banter office because they started him on Zetia for elevated CK levels.  I have asked that they send copy of his labs

## 2016-07-28 NOTE — Assessment & Plan Note (Signed)
Interesting, he did not have angina despite having significant CAD. Because of his lack of symptoms, we will continue to follow stress tests for surveillance.

## 2016-07-28 NOTE — Assessment & Plan Note (Signed)
Stable blood pressure on current regimen. For now will continue on. I did not have the losartan listed on his current medications for today's visit, this was updated after he went home.  No change for now. Monitored also by PCP.

## 2016-07-28 NOTE — Assessment & Plan Note (Signed)
No recurrent anginal symptoms. He never really did have angina. Nonischemic Myoview last year. Since he was not symptomatic in the past, I do think we need to do screening tests on him every 4 years or so. He has back on Crestor, and unstable low-dose metoprolol. Also now taking losartan-HCTZ. He tells me he was taking an aspirin daily, but is not listed on his medication list.

## 2016-08-17 DIAGNOSIS — M79671 Pain in right foot: Secondary | ICD-10-CM | POA: Diagnosis not present

## 2016-08-18 ENCOUNTER — Other Ambulatory Visit: Payer: Self-pay | Admitting: Cardiology

## 2016-10-04 DIAGNOSIS — E291 Testicular hypofunction: Secondary | ICD-10-CM | POA: Diagnosis not present

## 2016-10-04 DIAGNOSIS — R972 Elevated prostate specific antigen [PSA]: Secondary | ICD-10-CM | POA: Diagnosis not present

## 2016-10-05 DIAGNOSIS — I10 Essential (primary) hypertension: Secondary | ICD-10-CM | POA: Diagnosis not present

## 2016-10-05 DIAGNOSIS — E7800 Pure hypercholesterolemia, unspecified: Secondary | ICD-10-CM | POA: Diagnosis not present

## 2016-10-05 DIAGNOSIS — Z125 Encounter for screening for malignant neoplasm of prostate: Secondary | ICD-10-CM | POA: Diagnosis not present

## 2016-10-09 DIAGNOSIS — Z23 Encounter for immunization: Secondary | ICD-10-CM | POA: Diagnosis not present

## 2016-10-09 DIAGNOSIS — Z1159 Encounter for screening for other viral diseases: Secondary | ICD-10-CM | POA: Diagnosis not present

## 2016-10-09 DIAGNOSIS — E7800 Pure hypercholesterolemia, unspecified: Secondary | ICD-10-CM | POA: Diagnosis not present

## 2016-10-09 DIAGNOSIS — J309 Allergic rhinitis, unspecified: Secondary | ICD-10-CM | POA: Diagnosis not present

## 2016-10-09 DIAGNOSIS — M25541 Pain in joints of right hand: Secondary | ICD-10-CM | POA: Diagnosis not present

## 2016-10-09 DIAGNOSIS — M25542 Pain in joints of left hand: Secondary | ICD-10-CM | POA: Diagnosis not present

## 2016-10-10 DIAGNOSIS — E291 Testicular hypofunction: Secondary | ICD-10-CM | POA: Diagnosis not present

## 2016-10-10 DIAGNOSIS — R3121 Asymptomatic microscopic hematuria: Secondary | ICD-10-CM | POA: Diagnosis not present

## 2016-10-10 DIAGNOSIS — N5201 Erectile dysfunction due to arterial insufficiency: Secondary | ICD-10-CM | POA: Diagnosis not present

## 2016-10-10 DIAGNOSIS — R972 Elevated prostate specific antigen [PSA]: Secondary | ICD-10-CM | POA: Diagnosis not present

## 2016-11-02 DIAGNOSIS — H25813 Combined forms of age-related cataract, bilateral: Secondary | ICD-10-CM | POA: Diagnosis not present

## 2016-11-02 DIAGNOSIS — H1032 Unspecified acute conjunctivitis, left eye: Secondary | ICD-10-CM | POA: Diagnosis not present

## 2016-12-21 DIAGNOSIS — L720 Epidermal cyst: Secondary | ICD-10-CM | POA: Diagnosis not present

## 2016-12-21 DIAGNOSIS — L738 Other specified follicular disorders: Secondary | ICD-10-CM | POA: Diagnosis not present

## 2016-12-21 DIAGNOSIS — Z85828 Personal history of other malignant neoplasm of skin: Secondary | ICD-10-CM | POA: Diagnosis not present

## 2016-12-21 DIAGNOSIS — D2262 Melanocytic nevi of left upper limb, including shoulder: Secondary | ICD-10-CM | POA: Diagnosis not present

## 2016-12-21 DIAGNOSIS — L821 Other seborrheic keratosis: Secondary | ICD-10-CM | POA: Diagnosis not present

## 2016-12-21 DIAGNOSIS — L570 Actinic keratosis: Secondary | ICD-10-CM | POA: Diagnosis not present

## 2016-12-21 DIAGNOSIS — D225 Melanocytic nevi of trunk: Secondary | ICD-10-CM | POA: Diagnosis not present

## 2016-12-21 DIAGNOSIS — D2261 Melanocytic nevi of right upper limb, including shoulder: Secondary | ICD-10-CM | POA: Diagnosis not present

## 2016-12-21 DIAGNOSIS — D1801 Hemangioma of skin and subcutaneous tissue: Secondary | ICD-10-CM | POA: Diagnosis not present

## 2016-12-26 DIAGNOSIS — H527 Unspecified disorder of refraction: Secondary | ICD-10-CM | POA: Diagnosis not present

## 2016-12-26 DIAGNOSIS — H25813 Combined forms of age-related cataract, bilateral: Secondary | ICD-10-CM | POA: Diagnosis not present

## 2016-12-26 DIAGNOSIS — H02403 Unspecified ptosis of bilateral eyelids: Secondary | ICD-10-CM | POA: Diagnosis not present

## 2016-12-26 DIAGNOSIS — G902 Horner's syndrome: Secondary | ICD-10-CM | POA: Diagnosis not present

## 2016-12-26 DIAGNOSIS — H43811 Vitreous degeneration, right eye: Secondary | ICD-10-CM | POA: Diagnosis not present

## 2016-12-27 DIAGNOSIS — G902 Horner's syndrome: Secondary | ICD-10-CM | POA: Diagnosis not present

## 2016-12-27 DIAGNOSIS — I6523 Occlusion and stenosis of bilateral carotid arteries: Secondary | ICD-10-CM | POA: Diagnosis not present

## 2017-02-12 DIAGNOSIS — E7800 Pure hypercholesterolemia, unspecified: Secondary | ICD-10-CM | POA: Diagnosis not present

## 2017-02-19 DIAGNOSIS — E7800 Pure hypercholesterolemia, unspecified: Secondary | ICD-10-CM | POA: Diagnosis not present

## 2017-02-19 DIAGNOSIS — M25542 Pain in joints of left hand: Secondary | ICD-10-CM | POA: Diagnosis not present

## 2017-02-19 DIAGNOSIS — J310 Chronic rhinitis: Secondary | ICD-10-CM | POA: Diagnosis not present

## 2017-02-19 DIAGNOSIS — H5702 Anisocoria: Secondary | ICD-10-CM | POA: Diagnosis not present

## 2017-02-19 DIAGNOSIS — B001 Herpesviral vesicular dermatitis: Secondary | ICD-10-CM | POA: Diagnosis not present

## 2017-04-05 DIAGNOSIS — R972 Elevated prostate specific antigen [PSA]: Secondary | ICD-10-CM | POA: Diagnosis not present

## 2017-04-12 DIAGNOSIS — N5201 Erectile dysfunction due to arterial insufficiency: Secondary | ICD-10-CM | POA: Diagnosis not present

## 2017-04-12 DIAGNOSIS — R972 Elevated prostate specific antigen [PSA]: Secondary | ICD-10-CM | POA: Diagnosis not present

## 2017-04-12 DIAGNOSIS — E291 Testicular hypofunction: Secondary | ICD-10-CM | POA: Diagnosis not present

## 2017-06-12 DIAGNOSIS — G902 Horner's syndrome: Secondary | ICD-10-CM | POA: Diagnosis not present

## 2017-06-12 DIAGNOSIS — H02831 Dermatochalasis of right upper eyelid: Secondary | ICD-10-CM | POA: Diagnosis not present

## 2017-06-12 DIAGNOSIS — H04123 Dry eye syndrome of bilateral lacrimal glands: Secondary | ICD-10-CM | POA: Diagnosis not present

## 2017-06-12 DIAGNOSIS — H25813 Combined forms of age-related cataract, bilateral: Secondary | ICD-10-CM | POA: Diagnosis not present

## 2017-06-12 DIAGNOSIS — H527 Unspecified disorder of refraction: Secondary | ICD-10-CM | POA: Diagnosis not present

## 2017-06-12 DIAGNOSIS — H02834 Dermatochalasis of left upper eyelid: Secondary | ICD-10-CM | POA: Diagnosis not present

## 2017-06-12 DIAGNOSIS — H43811 Vitreous degeneration, right eye: Secondary | ICD-10-CM | POA: Diagnosis not present

## 2017-06-12 DIAGNOSIS — H52223 Regular astigmatism, bilateral: Secondary | ICD-10-CM | POA: Diagnosis not present

## 2017-06-12 DIAGNOSIS — H02403 Unspecified ptosis of bilateral eyelids: Secondary | ICD-10-CM | POA: Diagnosis not present

## 2017-06-14 DIAGNOSIS — H25813 Combined forms of age-related cataract, bilateral: Secondary | ICD-10-CM | POA: Diagnosis not present

## 2017-06-14 DIAGNOSIS — H52223 Regular astigmatism, bilateral: Secondary | ICD-10-CM | POA: Diagnosis not present

## 2017-06-27 DIAGNOSIS — H52223 Regular astigmatism, bilateral: Secondary | ICD-10-CM | POA: Diagnosis not present

## 2017-06-27 DIAGNOSIS — G902 Horner's syndrome: Secondary | ICD-10-CM | POA: Diagnosis not present

## 2017-06-27 DIAGNOSIS — H25813 Combined forms of age-related cataract, bilateral: Secondary | ICD-10-CM | POA: Diagnosis not present

## 2017-06-27 DIAGNOSIS — I2510 Atherosclerotic heart disease of native coronary artery without angina pectoris: Secondary | ICD-10-CM | POA: Diagnosis not present

## 2017-06-27 DIAGNOSIS — H25811 Combined forms of age-related cataract, right eye: Secondary | ICD-10-CM | POA: Diagnosis not present

## 2017-06-27 DIAGNOSIS — H43811 Vitreous degeneration, right eye: Secondary | ICD-10-CM | POA: Diagnosis not present

## 2017-06-27 DIAGNOSIS — H02834 Dermatochalasis of left upper eyelid: Secondary | ICD-10-CM | POA: Diagnosis not present

## 2017-06-27 DIAGNOSIS — I10 Essential (primary) hypertension: Secondary | ICD-10-CM | POA: Diagnosis not present

## 2017-06-27 DIAGNOSIS — Z87891 Personal history of nicotine dependence: Secondary | ICD-10-CM | POA: Diagnosis not present

## 2017-06-27 DIAGNOSIS — H02831 Dermatochalasis of right upper eyelid: Secondary | ICD-10-CM | POA: Diagnosis not present

## 2017-06-27 DIAGNOSIS — Z7982 Long term (current) use of aspirin: Secondary | ICD-10-CM | POA: Diagnosis not present

## 2017-06-27 DIAGNOSIS — H04123 Dry eye syndrome of bilateral lacrimal glands: Secondary | ICD-10-CM | POA: Diagnosis not present

## 2017-06-27 DIAGNOSIS — H02403 Unspecified ptosis of bilateral eyelids: Secondary | ICD-10-CM | POA: Diagnosis not present

## 2017-06-28 DIAGNOSIS — Z961 Presence of intraocular lens: Secondary | ICD-10-CM | POA: Diagnosis not present

## 2017-07-17 DIAGNOSIS — Z85828 Personal history of other malignant neoplasm of skin: Secondary | ICD-10-CM | POA: Diagnosis not present

## 2017-07-17 DIAGNOSIS — L309 Dermatitis, unspecified: Secondary | ICD-10-CM | POA: Diagnosis not present

## 2017-07-17 DIAGNOSIS — L738 Other specified follicular disorders: Secondary | ICD-10-CM | POA: Diagnosis not present

## 2017-07-17 DIAGNOSIS — L820 Inflamed seborrheic keratosis: Secondary | ICD-10-CM | POA: Diagnosis not present

## 2017-07-17 DIAGNOSIS — L570 Actinic keratosis: Secondary | ICD-10-CM | POA: Diagnosis not present

## 2017-07-25 DIAGNOSIS — H02403 Unspecified ptosis of bilateral eyelids: Secondary | ICD-10-CM | POA: Diagnosis not present

## 2017-07-25 DIAGNOSIS — Z9841 Cataract extraction status, right eye: Secondary | ICD-10-CM | POA: Diagnosis not present

## 2017-07-25 DIAGNOSIS — H04123 Dry eye syndrome of bilateral lacrimal glands: Secondary | ICD-10-CM | POA: Diagnosis not present

## 2017-07-25 DIAGNOSIS — H02834 Dermatochalasis of left upper eyelid: Secondary | ICD-10-CM | POA: Diagnosis not present

## 2017-07-25 DIAGNOSIS — Z961 Presence of intraocular lens: Secondary | ICD-10-CM | POA: Diagnosis not present

## 2017-07-25 DIAGNOSIS — I259 Chronic ischemic heart disease, unspecified: Secondary | ICD-10-CM | POA: Diagnosis not present

## 2017-07-25 DIAGNOSIS — H43811 Vitreous degeneration, right eye: Secondary | ICD-10-CM | POA: Diagnosis not present

## 2017-07-25 DIAGNOSIS — H02831 Dermatochalasis of right upper eyelid: Secondary | ICD-10-CM | POA: Diagnosis not present

## 2017-07-25 DIAGNOSIS — H52222 Regular astigmatism, left eye: Secondary | ICD-10-CM | POA: Diagnosis not present

## 2017-07-25 DIAGNOSIS — H25812 Combined forms of age-related cataract, left eye: Secondary | ICD-10-CM | POA: Diagnosis not present

## 2017-07-25 DIAGNOSIS — Z951 Presence of aortocoronary bypass graft: Secondary | ICD-10-CM | POA: Diagnosis not present

## 2017-07-25 DIAGNOSIS — G902 Horner's syndrome: Secondary | ICD-10-CM | POA: Diagnosis not present

## 2017-07-26 DIAGNOSIS — Z961 Presence of intraocular lens: Secondary | ICD-10-CM | POA: Diagnosis not present

## 2017-07-31 ENCOUNTER — Ambulatory Visit (INDEPENDENT_AMBULATORY_CARE_PROVIDER_SITE_OTHER): Payer: Medicare Other | Admitting: Cardiology

## 2017-07-31 ENCOUNTER — Encounter: Payer: Self-pay | Admitting: Cardiology

## 2017-07-31 VITALS — BP 144/76 | HR 68 | Ht 70.0 in | Wt 183.6 lb

## 2017-07-31 DIAGNOSIS — I2510 Atherosclerotic heart disease of native coronary artery without angina pectoris: Secondary | ICD-10-CM

## 2017-07-31 DIAGNOSIS — I10 Essential (primary) hypertension: Secondary | ICD-10-CM | POA: Diagnosis not present

## 2017-07-31 DIAGNOSIS — E785 Hyperlipidemia, unspecified: Secondary | ICD-10-CM | POA: Diagnosis not present

## 2017-07-31 NOTE — Progress Notes (Signed)
PCP: Isaac Pretty, MD  Clinic Note: Chief Complaint  Patient presents with  . Follow-up    No cardiac symptoms  . Coronary Artery Disease    HPI: [name] is a 72 y.o. male with a PMH below who presents today for [place] for CAD-CABG he was referred to me after treadmill stress test done in August 2012 that showed diffuse ST depressions with V1 and V2 ST elevations. I recommended cardiac catheterization which was performed by Dr Chase Picket - showed multivessel CAD and referred for CABG.  His last surveillance Myoview was in June 2017: No significant EKG changes & No ischemia/infarct.  EF 60%  [name] was last seen on July 26, 2016 --he was doing quite well.  He did acknowledge that he was "slacking off of his exercise routine, but was still staying active.  No angina or heart failure.  At that time he is back on low-dose rosuvastatin.  Was converted from losartan to losartan-HCTZ. -- He had been seen at [facility]ist back in March 2018 to discuss lipid management --> claims that he cannot afford PSK9-I.  Unable tolerate > 5 mg rosuvastatin.  Recent Hospitalizations: n/a  Studies Personally Reviewed - (if available, images/films reviewed: From Epic Chart or Care Everywhere)  n/a  Interval History: Teller returns today overall feeling quite well.  He says he is "too busy to exercise.  He has had so many things going on between eye surgery (bilateral cataracts) and houseguests along with travel.  Hearing is totally astigmatic from cardiac standpoint.  No anginal symptoms with rest or exertion.  No exertional dyspnea. No PND, orthopnea or edema. No palpitations, lightheadedness, dizziness, weakness or syncope/near syncope. No TIA/amaurosis fugax symptoms. No melena, hematochezia, hematuria, or epstaxis. No claudication.  ROS: A comprehensive was performed. Review of Systems  Constitutional: Negative for malaise/fatigue.  HENT: Negative for  congestion.   Genitourinary: Negative for dysuria.  Musculoskeletal: Positive for back pain.  Neurological: Negative for dizziness, seizures and weakness.  Psychiatric/Behavioral: Negative.   All other systems reviewed and are negative.  I have reviewed and (if needed) personally updated the patient's problem list, medications, allergies, past medical and surgical history, social and family history.   Past Medical History:  Diagnosis Date  . Arthritis   . CAD in native artery August 2012   Cardiac catheterization for abnormal treadmill portion of Myoview stress test -with diffuse market ST depressions in the inferior leads with ST elevations in V1 and V2; no ischemia or infarction on images --> 70% distal left main, 90% ostial LAD with 60-70% lesions in proximal. Minimal RCA or circumflex disease. Subtotal proximal rPDA: Referred for CABG  . Cancer (HCC)    skin cancers-squamous cell / facial   . Dyslipidemia - low HDL    Lipids monitored by Dr. Shelia Hopkins.  [name] GERD (gastroesophageal reflux disease)   . History of multiple allergies   . History of vertigo   . Hypertension    Labile  . Low testosterone    Recently stopped replacement therapy.  [name] Post-nasal drip   . 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  . Urinary incontinence, urge   . Wears glasses     Past Surgical History:  Procedure Laterality Date  . arthroscopy of knees bilat     . CARDIAC CATHETERIZATION  08/18/2010   Terminal left main 70%, ostial LAD 90%, sequential 60-70% lesion in the proximal LAD, mild  circumflex lesions, but large OM1 and OM2. RCA had moderate irregularities and a subtotal proximal PDA  . CARDIOVASCULAR STRESS TEST  02/06/2011   EKG negative for ischemia. No significant wall motion abnormalities noted.  . CHOLECYSTECTOMY N/A 07/29/2015   Procedure: LAPAROSCOPIC CHOLECYSTECTOMY WITH INTRAOPERATIVE CHOLANGIOGRAM;  Surgeon: Isaac Confer, MD;  Location: [facility];  Service: General;   Laterality: N/A;  . CORONARY ARTERY BYPASS GRAFT  08/29/2010   x4. LIMA to LAD, SVG to diag, SVG to OM, SVG to PDA. Rt thigh vein harvest  . NM MYOVIEW LTD  06/27/2015   EF 60%. No ischemia or infarction. NORMAL STUDY.  . nodule on testicle     . PRE CABG DOPPLER  08/25/2010   No significant extracranial carotid artery stenosis. ECA stenosis noted bilaterally - ICA/CCA ratio id 0.9-rt and 0.74-lft  . skin cancer removed      times 3  . TONSILLECTOMY    . TRANSTHORACIC ECHOCARDIOGRAM  07/27/2010   EF >55%, mild concentric LVH, stage 1 diastolic dysfunction    Current Meds  Medication Sig  . acetaminophen (TYLENOL) 325 MG tablet Take 650 mg by mouth. Take 2  Daily as needed  . aspirin EC 81 MG tablet Take 1 tablet (81 mg total) by mouth daily.  . Azelaic Acid (FINACEA) 15 % cream Apply topically as needed. After skin is thoroughly washed and patted dry, gently but thoroughly massage a thin film of azelaic acid cream into the affected area twice daily, in the morning and evening.   . Calcium Carbonate-Vitamin D (CALCIUM 600+D) 600-400 MG-UNIT per tablet Take 1 tablet by mouth daily.    . cetirizine (ZYRTEC) 10 MG tablet Take 10 mg by mouth daily.  . Coenzyme Q10 (CO Q-10) 100 MG CAPS Take 1 tablet by mouth daily.   [name] doxycycline (VIBRAMYCIN) 100 MG capsule Take 100 mg by mouth daily as needed (rosacea).   . Fish Oil OIL Take 1,200 mg by mouth daily.   [name] losartan-hydrochlorothiazide (HYZAAR) 100-12.5 MG tablet Take 1 tablet by mouth daily.  . metoprolol tartrate (LOPRESSOR) 25 MG tablet TAKE 1 TABLET EVERY MORNING AND TAKE ONE-HALF (1/2) TABLET EVERY EVENING  . pantoprazole (PROTONIX) 40 MG tablet Take 40 mg by mouth daily.    . Pediatric Multiple Vit-C-FA (PEDIATRIC MULTIVITAMIN) chewable tablet Chew 1 tablet by mouth daily.  . rosuvastatin (CRESTOR) 5 MG tablet Take by mouth. Pt takes 2.5 mg daily at bedtime  . sildenafil (REVATIO) 20 MG tablet Take 60 mg by mouth daily.   [name] testosterone  cypionate (DEPOTESTOTERONE CYPIONATE) 100 MG/ML injection Inject 100 mg into the muscle every 14 (fourteen) days.    Allergies  Allergen Reactions  . Cephalosporins Palpitations  . Triamterene-Hctz Palpitations  . Dexamethasone   . Statins Other (See Comments)    " elevated muscle enzymes"  . Vioxx [Rofecoxib] Swelling    Facial swelling  . Lisinopril Cough  . Sulfa Antibiotics Rash    Social History   Tobacco Use  . Smoking status: Never Smoker  . Smokeless tobacco: Former Systems developer    Types: Chew  Substance Use Topics  . Alcohol use: No  . Drug use: No   Social History   Social History Narrative   He is a married father of 2.   He does not smoke or drink alcohol.   Up until recently when he hurt his left knee, he was continuing to exercise vigorously at least 2-3 times a week sometimes up to 2 hours of time  doing cardiovascular and muscle strength exercises.    family history includes Cancer in his father; Heart disease in his brother and maternal grandfather; Heart failure in his brother.  Wt Readings from Last 3 Encounters:  07/31/17 183 lb 9.6 oz (83.3 kg)  07/26/16 180 lb 12.8 oz (82 kg)  07/29/15 180 lb 6 oz (81.8 kg)    PHYSICAL EXAM BP (!) 144/76   Pulse 68   Ht 5\' 10"  (1.778 m)   Wt 183 lb 9.6 oz (83.3 kg)   BMI 26.34 kg/m  Physical Exam  Constitutional: He is oriented to person, place, and time. He appears well-developed and well-nourished. No distress.  Well-groomed, healthy-appearing  HENT:  Head: Atraumatic.  Neck: Normal range of motion. Neck supple. No hepatojugular reflux and no JVD present. Carotid bruit is not present.  Cardiovascular: Normal rate, regular rhythm, normal heart sounds, intact distal pulses and normal pulses.  No extrasystoles are present. PMI is not displaced. Exam reveals no gallop and no friction rub.  No murmur heard. Pulmonary/Chest: Effort normal and breath sounds normal. No respiratory distress. He has no wheezes. He has no  rales.  Nonlabored, good air movement  Abdominal: Soft. Bowel sounds are normal. He exhibits no distension. There is no tenderness.  Musculoskeletal: Normal range of motion. He exhibits no edema.  Neurological: He is alert and oriented to person, place, and time.  Psychiatric: He has a normal mood and affect. His behavior is normal. Judgment and thought content normal.  Vitals reviewed.    Adult ECG Report  Rate: 68 ;  Rhythm: normal sinus rhythm and Nonspecific ST and T wave changes.  Left atrial enlargement.  Incomplete right bundle branch block.  Inferolateral T wave inversions more pronounced than last year but similar.;   Narrative Interpretation: Mostly similar EKG from last year.   Other studies Reviewed: Additional studies/ records that were reviewed today include:  Recent Labs:   Lab Results  Component Value Date   CHOL 211 (H) 03/05/2016   HDL 34 (L) 03/05/2016   LDLCALC 136 (H) 03/05/2016   TRIG 203 (H) 03/05/2016   CHOLHDL 6.2 (H) 03/05/2016    Lab Results  Component Value Date   CREATININE 1.27 (H) 03/05/2016   BUN 14 03/05/2016   NA 138 03/05/2016   K 4.2 03/05/2016   CL 99 03/05/2016   CO2 30 03/05/2016    ASSESSMENT / PLAN: Problem List Items Addressed This Visit    CAD in native artery; Left Main and LAD and occluded RPDA; referred for CABG - Primary (Chronic)    No anginal symptoms.  Nothing to suggest ischemia suggest exertional dyspnea or palpitations.  Nonischemic Myoview in 2017.  Not due until 2021.  On low-dose Crestor along with ARB.  Unable to tolerate more than low-dose beta-blocker. On low-dose aspirin.      Coronary artery disease involving native coronary artery of native heart without angina pectoris   Relevant Orders   EKG 12-Lead (Completed)   Dyslipidemia (high LDL; low HDL) (Chronic)    Dr. Shelia Hopkins has been following his lipids.  I have not seen him in over a year.  Would like to get a copy of the labs from PCP. He was supposed to be  on  Zetia, but apparently could not tolerate more than 5 mg.  I would still like to consider the possibility of new treatment options (other than PCSK9-I).  Pending lab review - will as [facility] pharmacist to determine if other options are available.  Hypertension - labile (Chronic)    His blood pressure has been up and down.  A little bit high today, but usually better than this at home.  We will simply continue current dose of ARB-HCTZ and beta-blocker.         I spent a total of 28minutes with the patient and chart review. >  50% of the time was spent in direct patient consultation.   Current medicines are reviewed at length with the patient today.  (+/- concerns) - cannot afford PCSK9-I The following changes have been made:  see below  Patient Instructions  Your physician wants you to follow-up in: [place] will receive a reminder letter in the mail two months in advance. If you don't receive a letter, please call our office to schedule the follow-up appointment.   If you need a refill on your cardiac medications before your next appointment, please call your pharmacy.     Studies Ordered:   Orders Placed This Encounter  Procedures  . EKG 12-Lead      Glenetta Hew, M.D., M.S. Interventional Cardiologist   Pager # [phone] Phone # [phone] [address] [place], [place] [address]   Thank you for choosing [facility] at [facility]!!

## 2017-07-31 NOTE — Patient Instructions (Signed)
Your physician wants you to follow-up in: ONE YEAR WITH DR HARDING You will receive a reminder letter in the mail two months in advance. If you don't receive a letter, please call our office to schedule the follow-up appointment.   If you need a refill on your cardiac medications before your next appointment, please call your pharmacy.  

## 2017-08-03 ENCOUNTER — Encounter: Payer: Self-pay | Admitting: Cardiology

## 2017-08-03 NOTE — Assessment & Plan Note (Signed)
Dr. 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  I have not seen him in over a year.  Would like to get a copy of the labs from PCP. He was supposed to be on  Zetia, but apparently could not tolerate more than 5 mg.  I would still like to consider the possibility of new treatment options (other than PCSK9-I).  Pending lab review - will as [facility] pharmacist to determine if other options are available.

## 2017-08-03 NOTE — Assessment & Plan Note (Signed)
His blood pressure has been up and down.  A little bit high today, but usually better than this at home.  We will simply continue current dose of ARB-HCTZ and beta-blocker.

## 2017-08-03 NOTE — Assessment & Plan Note (Signed)
No anginal symptoms.  Nothing to suggest ischemia suggest exertional dyspnea or palpitations.  Nonischemic Myoview in 2017.  Not due until 2021.  On low-dose Crestor along with ARB.  Unable to tolerate more than low-dose beta-blocker. On low-dose aspirin.

## 2017-08-05 ENCOUNTER — Telehealth: Payer: Self-pay | Admitting: Cardiology

## 2017-08-05 MED ORDER — METOPROLOL TARTRATE 25 MG PO TABS: 25.0000 mg | ORAL_TABLET | Freq: Two times a day (BID) | ORAL | 3 refills | Status: DC

## 2017-08-05 NOTE — Telephone Encounter (Signed)
New Message        *STAT* If patient is at the pharmacy, call can be transferred to refill team.   1. Which medications need to be refilled? (please list name of each medication and dose if known) metoprolol tartrate (LOPRESSOR) 25 MG tablet  2. Which pharmacy/location (including street and city if local pharmacy) is medication to be sent to[facility]  3. Do they need a 30 day or 90 day supply? 90    Patient is needing a refill and a new Rx. Patient was told to take 2 pills instead of 1 1/2

## 2017-08-05 NOTE — Telephone Encounter (Signed)
Spoke with pt, when he was seen he reported he is having to take sudafed for sinus issues and his bp will elevate and he was told to increase the metoprolol. New script sent to the pharmacy

## 2017-08-05 NOTE — Telephone Encounter (Signed)
This is Dr. Harding's pt. °

## 2017-10-18 DIAGNOSIS — D044 Carcinoma in situ of skin of scalp and neck: Secondary | ICD-10-CM | POA: Diagnosis not present

## 2017-10-18 DIAGNOSIS — L011 Impetiginization of other dermatoses: Secondary | ICD-10-CM | POA: Diagnosis not present

## 2017-10-18 DIAGNOSIS — L0889 Other specified local infections of the skin and subcutaneous tissue: Secondary | ICD-10-CM | POA: Diagnosis not present

## 2017-10-18 DIAGNOSIS — L309 Dermatitis, unspecified: Secondary | ICD-10-CM | POA: Diagnosis not present

## 2017-10-18 DIAGNOSIS — Z85828 Personal history of other malignant neoplasm of skin: Secondary | ICD-10-CM | POA: Diagnosis not present

## 2017-10-25 DIAGNOSIS — Z23 Encounter for immunization: Secondary | ICD-10-CM | POA: Diagnosis not present

## 2017-11-05 DIAGNOSIS — B009 Herpesviral infection, unspecified: Secondary | ICD-10-CM | POA: Diagnosis not present

## 2017-11-15 DIAGNOSIS — R972 Elevated prostate specific antigen [PSA]: Secondary | ICD-10-CM | POA: Diagnosis not present

## 2017-11-15 DIAGNOSIS — E291 Testicular hypofunction: Secondary | ICD-10-CM | POA: Diagnosis not present

## 2017-11-27 DIAGNOSIS — R972 Elevated prostate specific antigen [PSA]: Secondary | ICD-10-CM | POA: Diagnosis not present

## 2017-11-27 DIAGNOSIS — E291 Testicular hypofunction: Secondary | ICD-10-CM | POA: Diagnosis not present

## 2018-01-14 DIAGNOSIS — C61 Malignant neoplasm of prostate: Secondary | ICD-10-CM | POA: Diagnosis not present

## 2018-01-14 DIAGNOSIS — D075 Carcinoma in situ of prostate: Secondary | ICD-10-CM | POA: Diagnosis not present

## 2018-01-14 DIAGNOSIS — R972 Elevated prostate specific antigen [PSA]: Secondary | ICD-10-CM | POA: Diagnosis not present

## 2018-01-24 DIAGNOSIS — L309 Dermatitis, unspecified: Secondary | ICD-10-CM | POA: Diagnosis not present

## 2018-01-24 DIAGNOSIS — D485 Neoplasm of uncertain behavior of skin: Secondary | ICD-10-CM | POA: Diagnosis not present

## 2018-01-24 DIAGNOSIS — Z85828 Personal history of other malignant neoplasm of skin: Secondary | ICD-10-CM | POA: Diagnosis not present

## 2018-01-24 DIAGNOSIS — L570 Actinic keratosis: Secondary | ICD-10-CM | POA: Diagnosis not present

## 2018-01-28 DIAGNOSIS — C61 Malignant neoplasm of prostate: Secondary | ICD-10-CM | POA: Diagnosis not present

## 2018-01-31 IMAGING — US US ABDOMEN COMPLETE
1 series · 13 of 25 positions shown · non-contrast
Comparison: No recent prior.

CLINICAL DATA: Abdominal pain.

EXAM:
ABDOMEN ULTRASOUND COMPLETE liver echotexture is coarse suggesting
fatty

[Series 1: us abdomen complete · 0.24mm/px · 13 of 87 slices shown]
[im 1/87]
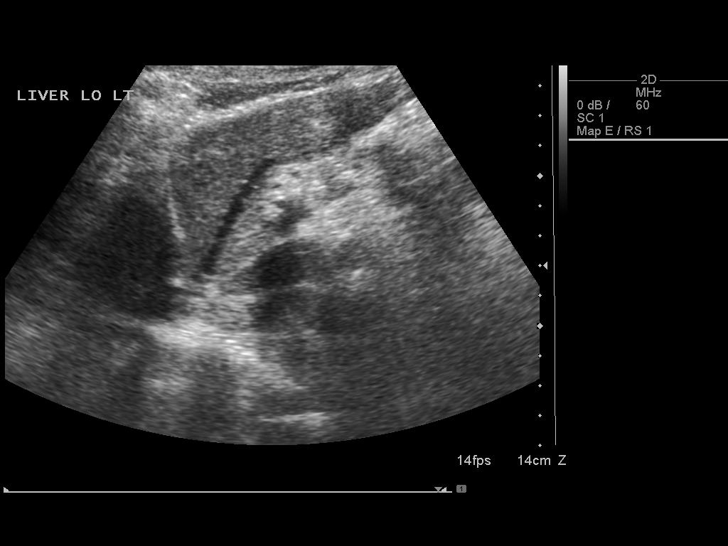
[im 8/87]
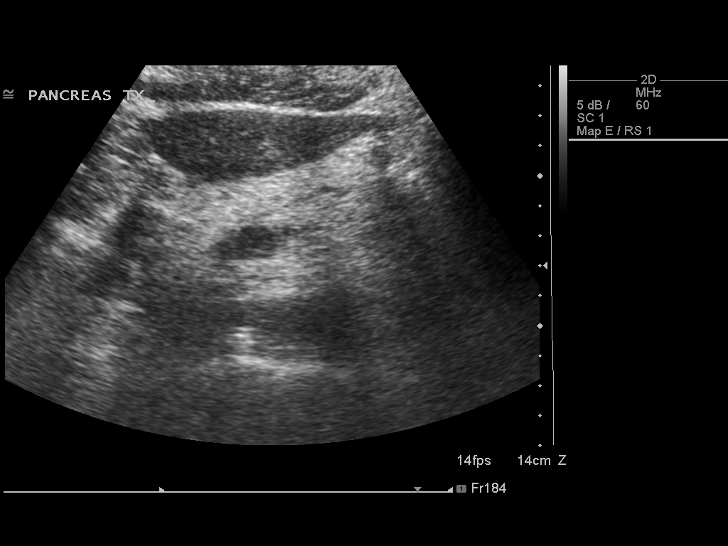
[im 15/87]
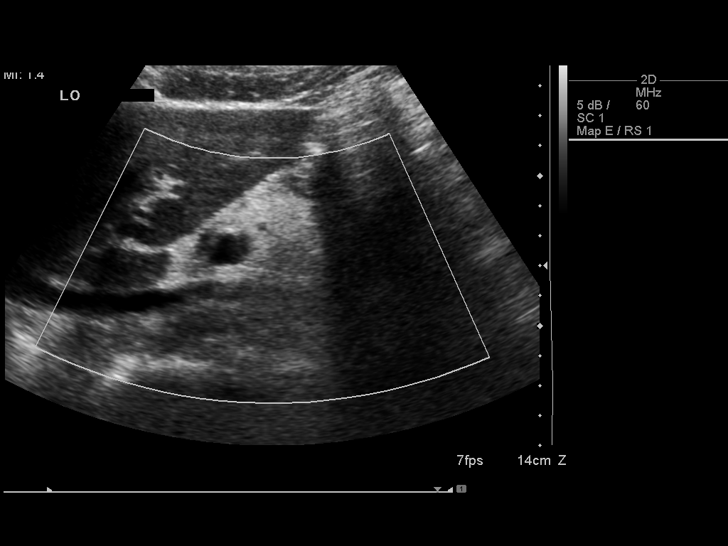
[im 22/87]
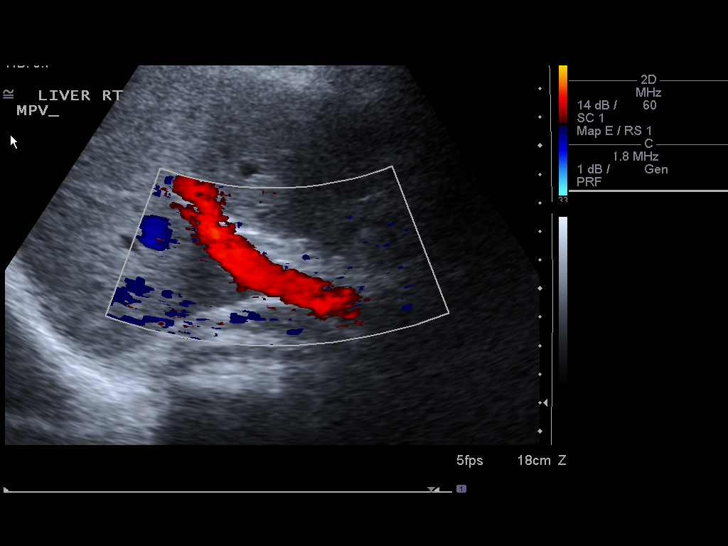
[im 29/87]
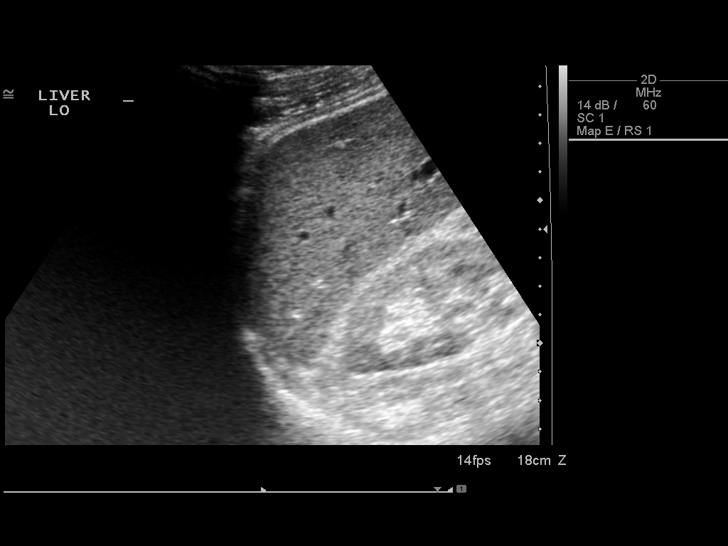
[im 36/87]
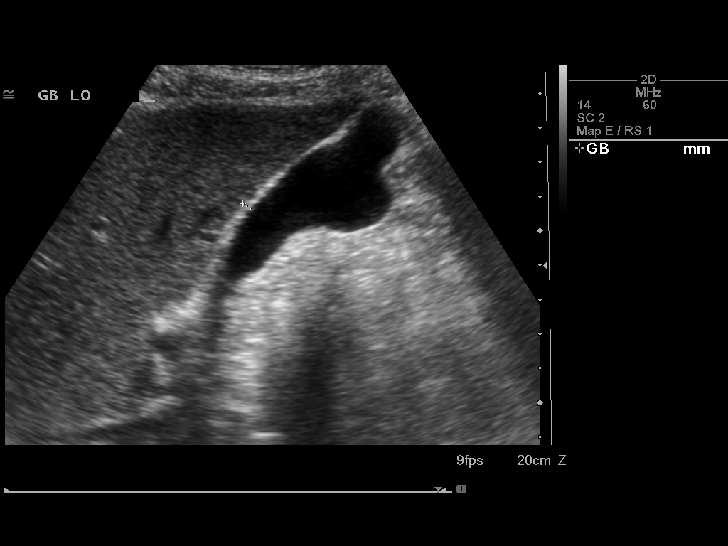
[im 44/87]
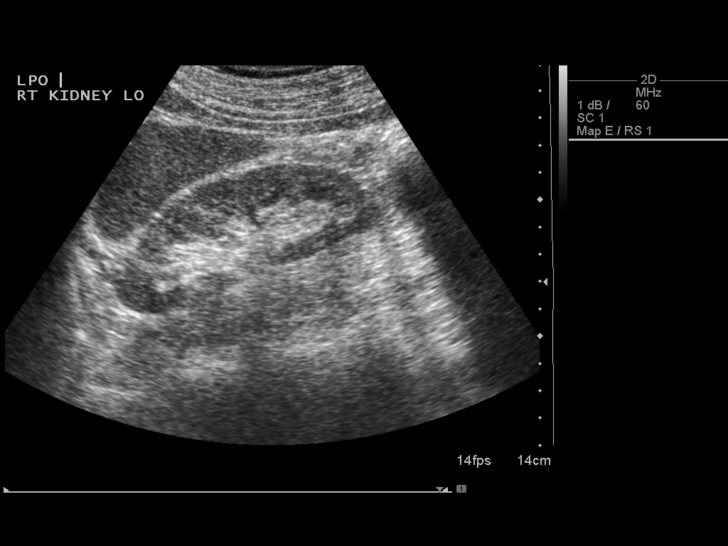
[im 51/87]
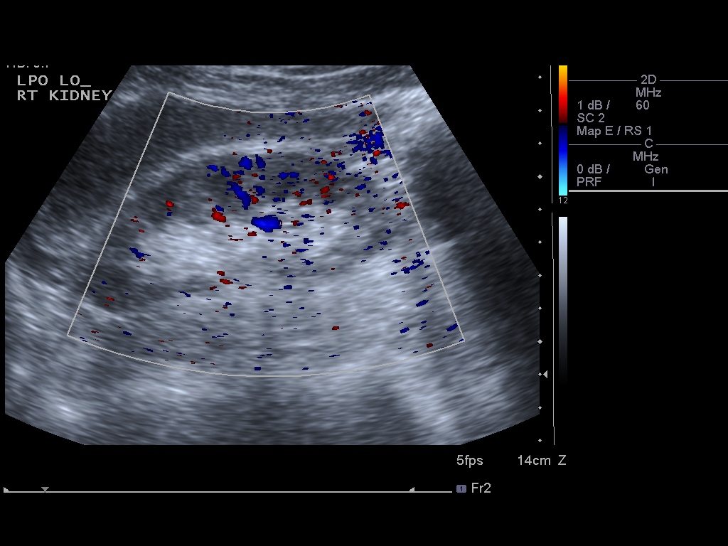
[im 58/87]
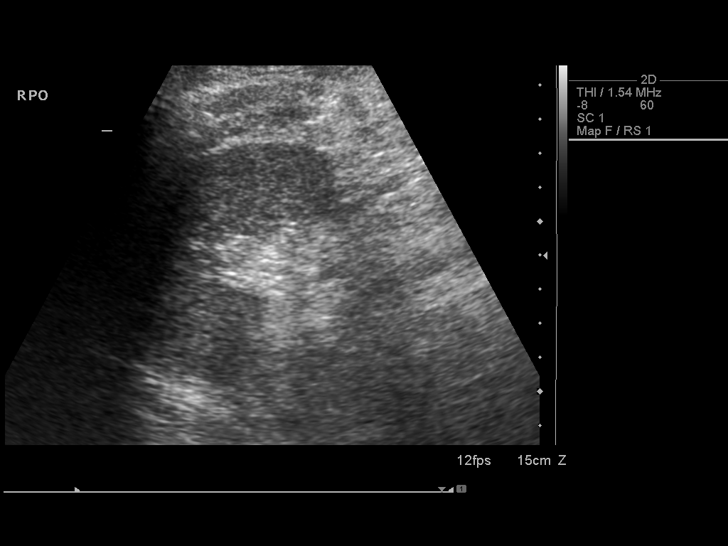
[im 65/87]
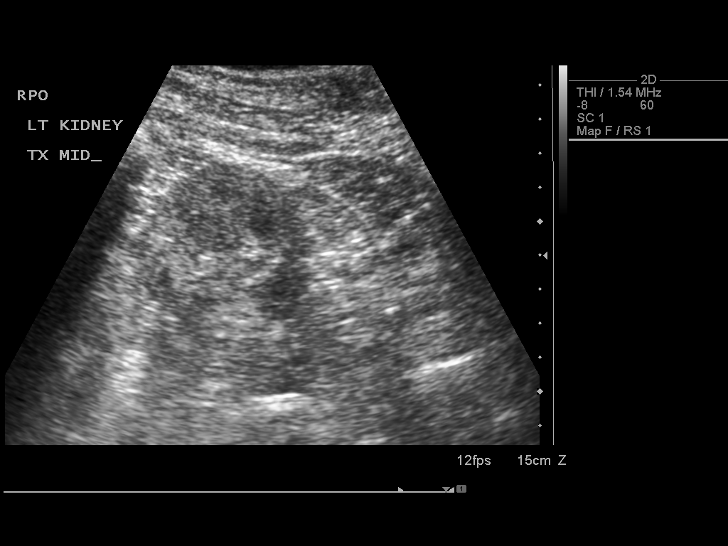
[im 72/87]
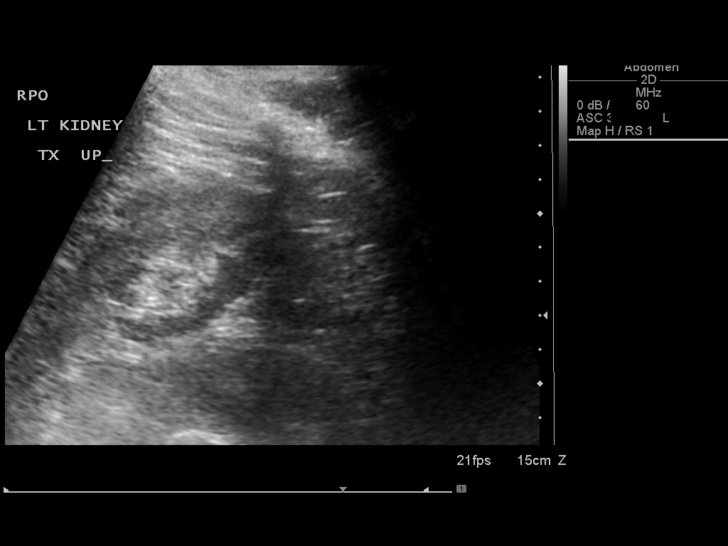
[im 79/87]
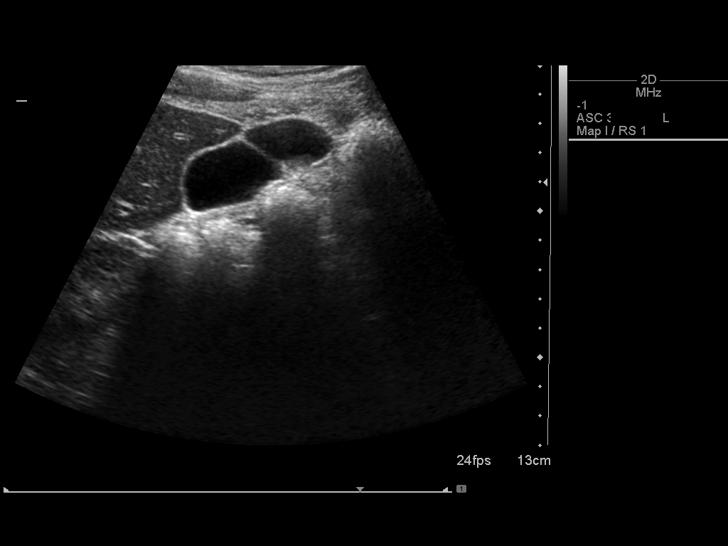
[im 87/87]
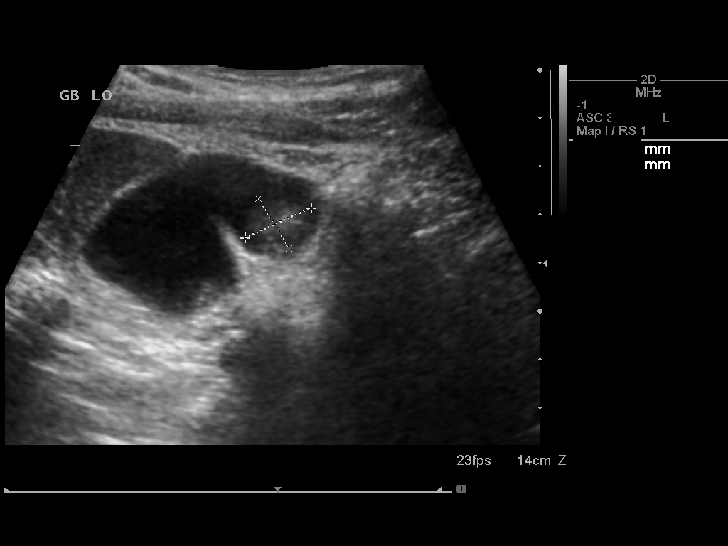

[13 of 25 positions shown; findings below may reference images not displayed]

FINDINGS: Gallbladder: Tiny gallstones noted. Sludge noted. A 1.5 cm rounded
echogenic non shadowing focus is noted within the gallbladder, most
likely a focal area sludge. Gallbladder wall thickness 3.3 mm.
Positive Murphy sign.

Common bile duct: Diameter: 3.5 mm

Liver: No focal lesion identified. Echogenic liver echotexture
consistent fatty infiltration and/or hepatocellular disease. No
focal hepatic abnormality identified.

IVC: No abnormality visualized.

Pancreas: Visualized portion unremarkable.

Spleen: Size and appearance within normal limits.

Right Kidney: Length: 10.4 cm. Echogenicity within normal limits. No
mass or hydronephrosis visualized.

Left Kidney: Length: 10.6 cm. Echogenicity within normal limits. No
mass or hydronephrosis visualized.

Abdominal aorta: No aneurysm visualized.

Other findings: None.
IMPRESSION: 1. Tiny gallstones. Prominent sludge. Gallbladder wall is slightly
thickened. Positive Murphy sign. Cholecystitis cannot be excluded.

2. Liver is echogenic consistent with fatty infiltration and/or
hepatocellular disease.

## 2018-05-09 ENCOUNTER — Other Ambulatory Visit: Payer: Self-pay | Admitting: Urology

## 2018-05-09 DIAGNOSIS — C61 Malignant neoplasm of prostate: Secondary | ICD-10-CM

## 2018-06-09 DIAGNOSIS — H33002 Unspecified retinal detachment with retinal break, left eye: Secondary | ICD-10-CM | POA: Diagnosis not present

## 2018-06-19 DIAGNOSIS — Z961 Presence of intraocular lens: Secondary | ICD-10-CM | POA: Diagnosis not present

## 2018-06-19 DIAGNOSIS — H43812 Vitreous degeneration, left eye: Secondary | ICD-10-CM | POA: Diagnosis not present

## 2018-06-23 DIAGNOSIS — I10 Essential (primary) hypertension: Secondary | ICD-10-CM | POA: Diagnosis not present

## 2018-06-23 DIAGNOSIS — Z125 Encounter for screening for malignant neoplasm of prostate: Secondary | ICD-10-CM | POA: Diagnosis not present

## 2018-06-23 DIAGNOSIS — E7800 Pure hypercholesterolemia, unspecified: Secondary | ICD-10-CM | POA: Diagnosis not present

## 2018-06-30 DIAGNOSIS — R9431 Abnormal electrocardiogram [ECG] [EKG]: Secondary | ICD-10-CM | POA: Diagnosis not present

## 2018-06-30 DIAGNOSIS — R748 Abnormal levels of other serum enzymes: Secondary | ICD-10-CM | POA: Diagnosis not present

## 2018-06-30 DIAGNOSIS — I10 Essential (primary) hypertension: Secondary | ICD-10-CM | POA: Diagnosis not present

## 2018-06-30 DIAGNOSIS — C61 Malignant neoplasm of prostate: Secondary | ICD-10-CM | POA: Diagnosis not present

## 2018-06-30 DIAGNOSIS — N400 Enlarged prostate without lower urinary tract symptoms: Secondary | ICD-10-CM | POA: Diagnosis not present

## 2018-06-30 DIAGNOSIS — J387 Other diseases of larynx: Secondary | ICD-10-CM | POA: Diagnosis not present

## 2018-06-30 DIAGNOSIS — I2510 Atherosclerotic heart disease of native coronary artery without angina pectoris: Secondary | ICD-10-CM | POA: Diagnosis not present

## 2018-06-30 DIAGNOSIS — M8580 Other specified disorders of bone density and structure, unspecified site: Secondary | ICD-10-CM | POA: Diagnosis not present

## 2018-06-30 DIAGNOSIS — E782 Mixed hyperlipidemia: Secondary | ICD-10-CM | POA: Diagnosis not present

## 2018-06-30 DIAGNOSIS — J309 Allergic rhinitis, unspecified: Secondary | ICD-10-CM | POA: Diagnosis not present

## 2018-06-30 DIAGNOSIS — Z8711 Personal history of peptic ulcer disease: Secondary | ICD-10-CM | POA: Diagnosis not present

## 2018-06-30 DIAGNOSIS — E559 Vitamin D deficiency, unspecified: Secondary | ICD-10-CM | POA: Diagnosis not present

## 2018-07-07 DIAGNOSIS — H35432 Paving stone degeneration of retina, left eye: Secondary | ICD-10-CM | POA: Diagnosis not present

## 2018-07-07 DIAGNOSIS — Z961 Presence of intraocular lens: Secondary | ICD-10-CM | POA: Diagnosis not present

## 2018-07-07 DIAGNOSIS — H43392 Other vitreous opacities, left eye: Secondary | ICD-10-CM | POA: Diagnosis not present

## 2018-07-07 DIAGNOSIS — H5319 Other subjective visual disturbances: Secondary | ICD-10-CM | POA: Diagnosis not present

## 2018-07-07 DIAGNOSIS — H43812 Vitreous degeneration, left eye: Secondary | ICD-10-CM | POA: Diagnosis not present

## 2018-07-07 DIAGNOSIS — H35362 Drusen (degenerative) of macula, left eye: Secondary | ICD-10-CM | POA: Diagnosis not present

## 2018-07-07 DIAGNOSIS — H21342 Primary cyst of pars plana, left eye: Secondary | ICD-10-CM | POA: Diagnosis not present

## 2018-07-29 ENCOUNTER — Other Ambulatory Visit: Payer: Self-pay

## 2018-07-29 ENCOUNTER — Ambulatory Visit
Admission: RE | Admit: 2018-07-29 | Discharge: 2018-07-29 | Disposition: A | Discharge status: Home / Self Care | Payer: Medicare Other | Source: Ambulatory Visit | Attending: Urology | Admitting: Urology

## 2018-07-29 DIAGNOSIS — C61 Malignant neoplasm of prostate: Secondary | ICD-10-CM

## 2018-07-29 MED ORDER — GADOBENATE DIMEGLUMINE 529 MG/ML IV SOLN
16.0000 mL | Freq: Once | INTRAVENOUS | Status: AC | PRN
  Administered 2018-07-29: 16 mL via INTRAVENOUS

## 2018-08-04 DIAGNOSIS — C61 Malignant neoplasm of prostate: Secondary | ICD-10-CM | POA: Diagnosis not present

## 2018-08-08 DIAGNOSIS — C61 Malignant neoplasm of prostate: Secondary | ICD-10-CM | POA: Diagnosis not present

## 2018-08-08 DIAGNOSIS — E291 Testicular hypofunction: Secondary | ICD-10-CM | POA: Diagnosis not present

## 2018-08-11 DIAGNOSIS — R748 Abnormal levels of other serum enzymes: Secondary | ICD-10-CM | POA: Diagnosis not present

## 2018-08-12 DIAGNOSIS — I10 Essential (primary) hypertension: Secondary | ICD-10-CM | POA: Diagnosis not present

## 2018-08-12 DIAGNOSIS — E7800 Pure hypercholesterolemia, unspecified: Secondary | ICD-10-CM | POA: Diagnosis not present

## 2018-08-22 ENCOUNTER — Other Ambulatory Visit: Payer: Self-pay | Admitting: Cardiology

## 2018-08-22 DIAGNOSIS — D075 Carcinoma in situ of prostate: Secondary | ICD-10-CM | POA: Diagnosis not present

## 2018-08-22 DIAGNOSIS — C61 Malignant neoplasm of prostate: Secondary | ICD-10-CM | POA: Diagnosis not present

## 2018-09-02 ENCOUNTER — Other Ambulatory Visit: Payer: Self-pay | Admitting: Urology

## 2018-09-02 ENCOUNTER — Other Ambulatory Visit (HOSPITAL_COMMUNITY): Payer: Self-pay | Admitting: Urology

## 2018-09-02 DIAGNOSIS — C61 Malignant neoplasm of prostate: Secondary | ICD-10-CM | POA: Diagnosis not present

## 2018-09-08 ENCOUNTER — Ambulatory Visit (HOSPITAL_COMMUNITY)
Admission: RE | Admit: 2018-09-08 | Discharge: 2018-09-08 | Disposition: A | Discharge status: Home / Self Care | Payer: Medicare Other | Source: Ambulatory Visit | Attending: Urology | Admitting: Urology

## 2018-09-08 ENCOUNTER — Other Ambulatory Visit: Payer: Self-pay

## 2018-09-08 ENCOUNTER — Encounter (HOSPITAL_COMMUNITY)
Admission: RE | Admit: 2018-09-08 | Discharge: 2018-09-08 | Disposition: A | Discharge status: Home / Self Care | Payer: Medicare Other | Source: Ambulatory Visit | Attending: Urology | Admitting: Urology

## 2018-09-08 DIAGNOSIS — C61 Malignant neoplasm of prostate: Secondary | ICD-10-CM

## 2018-09-08 MED ORDER — TECHNETIUM TC 99M MEDRONATE IV KIT
21.0000 | PACK | Freq: Once | INTRAVENOUS | Status: AC | PRN
  Administered 2018-09-08: 09:00:00 21 via INTRAVENOUS

## 2018-09-10 ENCOUNTER — Ambulatory Visit (INDEPENDENT_AMBULATORY_CARE_PROVIDER_SITE_OTHER): Payer: Medicare Other | Admitting: Cardiology

## 2018-09-10 ENCOUNTER — Other Ambulatory Visit: Payer: Self-pay

## 2018-09-10 ENCOUNTER — Encounter: Payer: Self-pay | Admitting: Cardiology

## 2018-09-10 VITALS — BP 151/89 | HR 69 | Ht 70.0 in | Wt 180.8 lb

## 2018-09-10 DIAGNOSIS — Z01810 Encounter for preprocedural cardiovascular examination: Secondary | ICD-10-CM | POA: Diagnosis not present

## 2018-09-10 DIAGNOSIS — E785 Hyperlipidemia, unspecified: Secondary | ICD-10-CM | POA: Diagnosis not present

## 2018-09-10 DIAGNOSIS — I2510 Atherosclerotic heart disease of native coronary artery without angina pectoris: Secondary | ICD-10-CM

## 2018-09-10 DIAGNOSIS — I10 Essential (primary) hypertension: Secondary | ICD-10-CM

## 2018-09-10 NOTE — Progress Notes (Signed)
PCP: Deland Pretty, MD  Clinic Note: Chief Complaint  Patient presents with   Follow-up    Has new diagnosis of prostate cancer, has yet to go through formal consultation with urology/heme-onc/rad   Coronary Artery Disease    No angina   Hyperlipidemia    Reluctant to take medications (indicated that PCSK9 habitus were too expensive.   Pre-op Exam    HPI: [name] is a 73 y.o. male with a PMH below who presents today for [place] for CAD-CABG He was initially referred to me after treadmill stress test done in August 2012 that showed diffuse ST depressions with V1 and V2 ST elevations. I recommended cardiac catheterization which was performed by Dr Chase Picket - showed multivessel CAD and referred for CABG.  His last surveillance Myoview was in June 2017: No significant EKG changes & No ischemia/infarct.  EF 60%  [name] was last seen on July 26, 2016 --he was doing quite well.  He did acknowledge that he was "slacking off of his exercise routine, but was still staying active.  No angina or heart failure.  At that time he is back on low-dose rosuvastatin.  Was converted from losartan to losartan-HCTZ. -- He had been seen at [facility] clinical pharmacist back in March 2018 to discuss lipid management --> claims that he cannot afford PSK9-I.  Unable tolerate > 5 mg rosuvastatin.  Recent Hospitalizations:   n/a  Studies Personally Reviewed - (if available, images/films reviewed: From Epic Chart or Care Everywhere)  none  Interval History: [name] returns today doing quite well overall from a cardiac standpoint.  He is remaining active.  Slowed down a little bit because the COVID-19.  He is no longer.  Play golf or go to the gym to do his exercise.  Somewhat he does is use his home treadmill exercise machine where he will walk for at 1.5 miles at the highest incline level and intensity.  He then goes in and does some light weight strengthening exercises as well as  stretching. He denies any resting exertional chest pain or pressure.  No exertional dyspnea. Remainder of cardiovascular review of symptoms: no chest pain or dyspnea on exertion positive for - Rare positional dizziness negative for - edema, irregular heartbeat, orthopnea, palpitations, paroxysmal nocturnal dyspnea, rapid heart rate, shortness of breath or Syncope/near syncope, TIA shows amaurosis fugax.  Melena, hematochezia, hematuria or epistaxis.  Claudication.   ROS: A comprehensive was performed. Review of Systems  Constitutional: Negative for malaise/fatigue and weight loss.  HENT: Negative for congestion and nosebleeds.   Respiratory: Negative for wheezing.   Gastrointestinal: Negative for blood in stool, constipation, heartburn and melena.  Genitourinary: Negative for dysuria.  Musculoskeletal: Positive for back pain.  Neurological: Negative for dizziness, seizures and weakness.  Psychiatric/Behavioral: Negative.        Although preoccupied with the new diagnosis of prostate cancer.  Very concerned.  All other systems reviewed and are negative.  The patient does not have symptoms concerning for COVID-19 infection (fever, chills, cough, or new shortness of breath).  The patient is practicing social distancing.  He and his wife are actually wearing full and 95 masks to the clinic today.  They are very aggressively distancing.   COVID-19 Education: The signs and symptoms of COVID-19 were discussed with the patient and how to seek care for testing (follow up with PCP or arrange E-visit).   The importance of social distancing was discussed today.   I have  reviewed and (if needed) personally updated the patient's problem list, medications, allergies, past medical and surgical history, social and family history.   Past Medical History:  Diagnosis Date   Arthritis    CAD in native artery August 2012   Cardiac catheterization for abnormal treadmill portion of Myoview stress test  -with diffuse market ST depressions in the inferior leads with ST elevations in V1 and V2; no ischemia or infarction on images --> 70% distal left main, 90% ostial LAD with 60-70% lesions in proximal. Minimal RCA or circumflex disease. Subtotal proximal rPDA: Referred for CABG   Cancer (HCC)    skin cancers-squamous cell / facial    Dyslipidemia - low HDL    Lipids monitored by Dr. Shelia Media.   GERD (gastroesophageal reflux disease)    History of multiple allergies    History of vertigo    Hypertension    Labile   Low testosterone    Recently stopped replacement therapy.   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   Urinary incontinence, urge    Wears glasses     Past Surgical History:  Procedure Laterality Date   arthroscopy of knees bilat      CARDIAC CATHETERIZATION  08/18/2010   Terminal left main 70%, ostial LAD 90%, sequential 60-70% lesion in the proximal LAD, mild circumflex lesions, but large OM1 and OM2. RCA had moderate irregularities and a subtotal proximal PDA   CARDIOVASCULAR STRESS TEST  02/06/2011   EKG negative for ischemia. No significant wall motion abnormalities noted.   CHOLECYSTECTOMY N/A 07/29/2015   Procedure: LAPAROSCOPIC CHOLECYSTECTOMY WITH INTRAOPERATIVE CHOLANGIOGRAM;  Surgeon: Jackolyn Confer, MD;  Location: [facility];  Service: General;  Laterality: N/A;   CORONARY ARTERY BYPASS GRAFT  08/29/2010   x4. LIMA to LAD, SVG to diag, SVG to OM, SVG to PDA. Rt thigh vein harvest   NM MYOVIEW LTD  06/27/2015   EF 60%. No ischemia or infarction. NORMAL STUDY.   nodule on testicle      PRE CABG DOPPLER  08/25/2010   No significant extracranial carotid artery stenosis. ECA stenosis noted bilaterally - ICA/CCA ratio id 0.9-rt and 0.74-lft   skin cancer removed      times 3   TONSILLECTOMY     TRANSTHORACIC ECHOCARDIOGRAM  07/27/2010   EF >55%, mild concentric LVH, stage 1 diastolic dysfunction    Current  Meds  Medication Sig   acetaminophen (TYLENOL) 325 MG tablet Take 650 mg by mouth. Take 2  Daily as needed   aspirin EC 81 MG tablet Take 1 tablet (81 mg total) by mouth daily.   Azelaic Acid (FINACEA) 15 % cream Apply topically as needed. After skin is thoroughly washed and patted dry, gently but thoroughly massage a thin film of azelaic acid cream into the affected area twice daily, in the morning and evening.    Calcium Carbonate-Vitamin D (CALCIUM 600+D) 600-400 MG-UNIT per tablet Take 1 tablet by mouth daily.     cetirizine (ZYRTEC) 10 MG tablet Take 10 mg by mouth daily.   Coenzyme Q10 (CO Q-10) 100 MG CAPS Take 1 tablet by mouth daily.    doxycycline (VIBRAMYCIN) 100 MG capsule Take 100 mg by mouth daily as needed (rosacea).    Fish Oil OIL Take 1,200 mg by mouth daily.    losartan-hydrochlorothiazide (HYZAAR) 100-12.5 MG tablet Take 1 tablet by mouth daily.   metoprolol tartrate (LOPRESSOR) 25 MG tablet Take 1  tablet (25 mg total) by mouth 2 (two) times daily. KEEP OV.   pantoprazole (PROTONIX) 40 MG tablet Take 40 mg by mouth daily.     Pediatric Multiple Vit-C-FA (PEDIATRIC MULTIVITAMIN) chewable tablet Chew 1 tablet by mouth daily.   rosuvastatin (CRESTOR) 5 MG tablet Take by mouth. Pt takes 2.5 mg daily at bedtime   sildenafil (REVATIO) 20 MG tablet Take 60 mg by mouth daily.     Allergies  Allergen Reactions   Cephalosporins Palpitations   Triamterene-Hctz Palpitations   Statins Other (See Comments)    " elevated muscle enzymes"   Vioxx [Rofecoxib] Swelling    Facial swelling   Lisinopril Cough   Sulfa Antibiotics Rash    Social History   Tobacco Use   Smoking status: Never Smoker   Smokeless tobacco: Former Systems developer    Types: Chew  Substance Use Topics   Alcohol use: No   Drug use: No   Social History   Social History Narrative   He is a married father of 2.   He does not smoke or drink alcohol.   Up until recently when he hurt his left  knee, he was continuing to exercise vigorously at least 2-3 times a week sometimes up to 2 hours of time doing cardiovascular and muscle strength exercises.    family history includes Cancer in his father; Heart disease in his brother and maternal grandfather; Heart failure in his brother.  Wt Readings from Last 3 Encounters:  09/10/18 180 lb 12.8 oz (82 kg)  07/31/17 183 lb 9.6 oz (83.3 kg)  07/26/16 180 lb 12.8 oz (82 kg)    PHYSICAL EXAM BP (!) 151/89    Pulse 69    Ht 5\' 10"  (1.778 m)    Wt 180 lb 12.8 oz (82 kg)    SpO2 99%    BMI 25.94 kg/m   He has a spreadsheet of his blood pressures ranging from 128/60-130 6/76 average over the last month. Physical Exam  Constitutional: He is oriented to person, place, and time. He appears well-developed and well-nourished. No distress.  Well-groomed, healthy-appearing  HENT:  Head: Atraumatic.  Neck: Normal range of motion. Neck supple. No hepatojugular reflux and no JVD present. Carotid bruit is not present.  Cardiovascular: Normal rate, regular rhythm, normal heart sounds, intact distal pulses and normal pulses.  No extrasystoles are present. PMI is not displaced. Exam reveals no gallop and no friction rub.  No murmur heard. Pulmonary/Chest: Effort normal and breath sounds normal. No respiratory distress. He has no wheezes. He has no rales.  Nonlabored, good air movement  Abdominal: Soft. Bowel sounds are normal. He exhibits no distension. There is no abdominal tenderness.  Musculoskeletal: Normal range of motion.        General: No edema.  Neurological: He is alert and oriented to person, place, and time.  Psychiatric: He has a normal mood and affect. His behavior is normal. Judgment and thought content normal.  Vitals reviewed.    Adult ECG Report  Rate: 69;  Rhythm: normal sinus rhythm and Nonspecific ST and T wave changes.  Left atrial enlargement.  iRBBB..  Inferolateral T wave inversions ;   Narrative Interpretation:  Stable   Other studies Reviewed: Additional studies/ records that were reviewed today include:  Recent Labs:  -Partial labs from June 2020 show HDL 48 and triglycerides 224.  But LDL and total [place] are not reported. Cr 0.7 (He is due to have labs rechecked prior to December, would ask  that these labs be forwarded to our office) Lab Results  Component Value Date   CHOL 211 (H) 03/05/2016   HDL 34 (L) 03/05/2016   LDLCALC 136 (H) 03/05/2016   TRIG 203 (H) 03/05/2016   CHOLHDL 6.2 (H) 03/05/2016    Lab Results  Component Value Date   CREATININE 1.27 (H) 03/05/2016   BUN 14 03/05/2016   NA 138 03/05/2016   K 4.2 03/05/2016   CL 99 03/05/2016   CO2 30 03/05/2016    ASSESSMENT / PLAN: Problem List Items Addressed This Visit    Preoperative cardiovascular examination    [name] is doing very well and stable from a cardiac standpoint.  No active symptoms.  I do not see any reason to move up his surveillance stress test a year or 2 would simply because his surgery is pending.  He is fine without any angina or heart failure.  Preserved EF and ostensibly revascularized coronary arteries.  PREOPERATIVE CARDIAC RISK ASSESSMENT   Revised Cardiac Risk Index:  High Risk Surgery: no;   Defined as Intraperitoneal, intrathoracic or suprainguinal vascular  Active CAD: no; no active angina symptoms.  Last Myoview was in 2017 and was nonischemic.  CHF: no; euvolemic on exam.  Cerebrovascular Disease: no;   Diabetes: no; On Insulin: no  CKD (Cr >~ 2): no;   Total: 0 Estimated Risk of Adverse Outcome: LOW RISK for LOW RISK surgery Estimated Risk of MI, PE, VF/VT (Cardiac Arrest), Complete Heart Block: <1.5 %   ACC/AHA Guidelines for "Clearance":  Step 1 - Need for Emergency Surgery: No: Elective  If Yes - go straight to OR with perioperative surveillance  Step 2 - Active Cardiac Conditions (Unstable Angina, Decompensated HF, Significant  Arrhytmias - Complete HB, Mobitz II,  Symptomatic VT or SVT, Severe Aortic Stenosis - mean gradient > 40 mmHg, Valve area < 1.0 cm2):   No: Totally asymptomatic--no angina or heart failure  If Yes - Evaluate & Treat per ACC/AHA Guidelines  Step 3 -  Low Risk Surgery: Yes  If Yes --> proceed to OR  If No --> Step 4.    Step 4 - Functional Capacity >= 4 METS without symptoms: Yes -close to 8-9 METS  If Yes --> proceed to OR  If No --> Step 5  Step 5 --  Clinical Risk Factors (CRF)   3 or more: No:   If Yes -- assess Surgical Risk, --   (High Risk Non-cardiac), Intraabdominal or thoracic vascular surgery consider testing if it will change management.  Intermediate Risk: Proceed to OR with HR control, or consider testing if it will change management  1-2 or more CRFs: No:   If Yes -- assess Surgical Risk, --   (High Risk Non-cardiac), Intraabdominal or thoracic vascular surgery --> Proceed to OR, or consider testing if it will change management.  Intermediate Risk: Proceed to OR with HR control, or consider testing if it will change management  No CRFs: Yes  If Yes --> Proceed to OR As testing would not alter my management of this gentleman, I think he can proceed to the OR without any further evaluation.  Okay to hold aspirin for procedures.       Hypertension - labile (Chronic)    Blood pressure does have ups and downs, but his recent log shows it is much better than it is today.  He is somewhat anxious today for some reason. Is on low-dose metoprolol which he could take an extra dose if needed  for high blood pressure.  He is on losartan-HCTZ is stable dose.      Relevant Orders   EKG 12-Lead   Dyslipidemia (high LDL; low HDL) (Chronic)    Unfortunately I do not have an accurate to the labs.  Thankfully I when I have them send [place] his labs from this fall. He is on 5 mg rosuvastatin plus fish oil plus co-Q10.   \ I would like if his PCP sends his next labs to me so we can assess and evaluate.  If LDL  is not at goal, I think we could potentially consider a joint effort with cardiovascular risk reduction clinic ([facility]).  There are new options and new plans there besides what he is started.  Could consider PCSK9 Hamiter, although that cost was prohibitive.  Prices may have changed.  Otherwise consider Bempedoic Acid or even consider the potential use of the Orion trial drug.      Coronary artery disease involving native coronary artery of native heart without angina pectoris (Chronic)    He never really had true anginal symptoms.  Just a little bit of fatigue back when he had a stress test on leading to initial cath-CABG.  He is absolutely reluctant to take any higher dose of rosuvastatin.  He tried 10 mg and was not able to it so is taken 5 mg rosuvastatin along with co-Q10.  He is on a beta-blocker and ARB albeit relatively low dose of beta-blocker.  No recurrent anginal symptoms that I can tell from exam.  Remains on aspirin but not Plavix.  Would be okay to hold aspirin for surgeries.      CAD in native artery; Left Main and LAD and occluded RPDA; referred for CABG - Primary (Chronic)    Despite having known native CAD and CABG, he is not having any active anginal symptoms with rest or exertion.  As such, I do not know that it is necessary to delay potential surgery for ischemic evaluation in the absence of symptoms.  No changes to regimen.      Relevant Orders   EKG 12-Lead      I spent a total of 30 minutes with the patient and chart review. >  50% of the time was spent in direct patient consultation.   Current medicines are reviewed at length with the patient today.  (+/- concerns) - cannot afford PCSK9-I The following changes have been made:  see below  Patient Instructions  Medication Instructions:  NO CHANGES   If you need a refill on your cardiac medications before your next appointment, please call your pharmacy.   Lab work: Not needed  please have labs ( LIPID) sent  to office -in [place].  Testing/Procedures:   Follow-Up: At [facility], you and your health needs are our priority.  As part of our continuing mission to provide you with exceptional heart care, we have created designated Provider Care Teams.  These Care Teams include your primary Cardiologist (physician) and Advanced Practice Providers (APPs -  Physician Assistants and Nurse Practitioners) who all work together to provide you with the care you need, when you need it.  You will need a follow up appointment in 12  Months- SEPT 2020.  Please call our office 2 months in advance to schedule this appointment.  You may see Isaac Hew, MD or one of the following Advanced Practice Providers on your designated Care Team:    Rosaria Ferries, PA-C  Jory Sims, DNP, ANP  Any Other Special Instructions Will Be Listed Below (If Applicable).    Studies Ordered:   Orders Placed This Encounter  Procedures   EKG 12-Lead      [name], M.D., M.S. Interventional Cardiologist   Pager # [phone] Phone # [phone] [address] [place], [place] [address]   Thank you for choosing Heartcare at [facility]!!

## 2018-09-10 NOTE — Patient Instructions (Signed)
Medication Instructions:  NO CHANGES   If you need a refill on your cardiac medications before your next appointment, please call your pharmacy.   Lab work: Not needed  please have labs ( LIPID) sent to office -in [place].  Testing/Procedures:   Follow-Up: At [facility], you and your health needs are our priority.  As part of our continuing mission to provide you with exceptional heart care, we have created designated Provider Care Teams.  These Care Teams include your primary Cardiologist (physician) and Advanced Practice Providers (APPs -  Physician Assistants and Nurse Practitioners) who all work together to provide you with the care you need, when you need it. . You will need a follow up appointment in 12  Months- SEPT 2020.  Please call our office 2 months in advance to schedule this appointment.  You may see Glenetta Hew, MD or one of the following Advanced Practice Providers on your designated Care Team:   . Rosaria Ferries, PA-C . Jory Sims, DNP, ANP  Any Other Special Instructions Will Be Listed Below (If Applicable).

## 2018-09-11 ENCOUNTER — Ambulatory Visit (HOSPITAL_COMMUNITY)
Admission: RE | Admit: 2018-09-11 | Discharge: 2018-09-11 | Disposition: A | Discharge status: Home / Self Care | Payer: Medicare Other | Source: Ambulatory Visit | Attending: Urology | Admitting: Urology

## 2018-09-11 ENCOUNTER — Other Ambulatory Visit (HOSPITAL_COMMUNITY): Payer: Self-pay | Admitting: Urology

## 2018-09-11 ENCOUNTER — Encounter: Payer: Self-pay | Admitting: Cardiology

## 2018-09-11 DIAGNOSIS — C61 Malignant neoplasm of prostate: Secondary | ICD-10-CM | POA: Diagnosis not present

## 2018-09-11 DIAGNOSIS — Z8546 Personal history of malignant neoplasm of prostate: Secondary | ICD-10-CM | POA: Diagnosis not present

## 2018-09-11 DIAGNOSIS — Z01810 Encounter for preprocedural cardiovascular examination: Secondary | ICD-10-CM | POA: Insufficient documentation

## 2018-09-11 NOTE — Assessment & Plan Note (Signed)
Despite having known native CAD and CABG, he is not having any active anginal symptoms with rest or exertion.  As such, I do not know that it is necessary to delay potential surgery for ischemic evaluation in the absence of symptoms.  No changes to regimen.

## 2018-09-11 NOTE — Assessment & Plan Note (Signed)
Blood pressure does have ups and downs, but his recent log shows it is much better than it is today.  He is somewhat anxious today for some reason. Is on low-dose metoprolol which he could take an extra dose if needed for high blood pressure.  He is on losartan-HCTZ is stable dose.

## 2018-09-11 NOTE — Assessment & Plan Note (Signed)
He never really had true anginal symptoms.  Just a little bit of fatigue back when he had a stress test on leading to initial cath-CABG.  He is absolutely reluctant to take any higher dose of rosuvastatin.  He tried 10 mg and was not able to it so is taken 5 mg rosuvastatin along with co-Q10.  He is on a beta-blocker and ARB albeit relatively low dose of beta-blocker.  No recurrent anginal symptoms that I can tell from exam.  Remains on aspirin but not Plavix.  Would be okay to hold aspirin for surgeries.

## 2018-09-11 NOTE — Assessment & Plan Note (Signed)
[name] is doing very well and stable from a cardiac standpoint.  No active symptoms.  I do not see any reason to move up his surveillance stress test a year or 2 would simply because his surgery is pending.  He is fine without any angina or heart failure.  Preserved EF and ostensibly revascularized coronary arteries.  PREOPERATIVE CARDIAC RISK ASSESSMENT   Revised Cardiac Risk Index:  High Risk Surgery: no;   Defined as Intraperitoneal, intrathoracic or suprainguinal vascular  Active CAD: no; no active angina symptoms.  Last Myoview was in 2017 and was nonischemic.  CHF: no; euvolemic on exam.  Cerebrovascular Disease: no;   Diabetes: no; On Insulin: no  CKD (Cr >~ 2): no;   Total: 0 Estimated Risk of Adverse Outcome: LOW RISK for LOW RISK surgery Estimated Risk of MI, PE, VF/VT (Cardiac Arrest), Complete Heart Block: <1.5 %   ACC/AHA Guidelines for "Clearance":  Step 1 - Need for Emergency Surgery: No: Elective  If Yes - go straight to OR with perioperative surveillance  Step 2 - Active Cardiac Conditions (Unstable Angina, Decompensated HF, Significant  Arrhytmias - Complete HB, Mobitz II, Symptomatic VT or SVT, Severe Aortic Stenosis - mean gradient > 40 mmHg, Valve area < 1.0 cm2):   No: Totally asymptomatic--no angina or heart failure  If Yes - Evaluate & Treat per ACC/AHA Guidelines  Step 3 -  Low Risk Surgery: Yes  If Yes --> proceed to OR  If No --> Step 4.    Step 4 - Functional Capacity >= 4 METS without symptoms: Yes -close to 8-9 METS  If Yes --> proceed to OR  If No --> Step 5  Step 5 --  Clinical Risk Factors (CRF)   3 or more: No:   If Yes -- assess Surgical Risk, --   (High Risk Non-cardiac), Intraabdominal or thoracic vascular surgery consider testing if it will change management.  Intermediate Risk: Proceed to OR with HR control, or consider testing if it will change management  1-2 or more CRFs: No:   If Yes -- assess Surgical Risk, --    (High Risk Non-cardiac), Intraabdominal or thoracic vascular surgery --> Proceed to OR, or consider testing if it will change management.  Intermediate Risk: Proceed to OR with HR control, or consider testing if it will change management  No CRFs: Yes  If Yes --> Proceed to OR As testing would not alter my management of this gentleman, I think he can proceed to the OR without any further evaluation.  Okay to hold aspirin for procedures.

## 2018-09-11 NOTE — Assessment & Plan Note (Signed)
Unfortunately I do not have an accurate to the labs.  Thankfully I when I have them send [place] his labs from this fall. He is on 5 mg rosuvastatin plus fish oil plus co-Q10.   \ I would like if his PCP sends his next labs to me so we can assess and evaluate.  If LDL is not at goal, I think we could potentially consider a joint effort with cardiovascular risk reduction clinic ([facility]).  There are new options and new plans there besides what he is started.  Could consider PCSK9 Hamiter, although that cost was prohibitive.  Prices may have changed.  Otherwise consider Bempedoic Acid or even consider the potential use of the Orion trial drug.

## 2018-09-16 ENCOUNTER — Other Ambulatory Visit: Payer: Self-pay

## 2018-09-16 ENCOUNTER — Ambulatory Visit
Admission: RE | Admit: 2018-09-16 | Discharge: 2018-09-16 | Disposition: A | Discharge status: Home / Self Care | Payer: Medicare Other | Source: Ambulatory Visit | Attending: Radiation Oncology | Admitting: Radiation Oncology

## 2018-09-16 ENCOUNTER — Encounter: Payer: Self-pay | Admitting: Radiation Oncology

## 2018-09-16 VITALS — Ht 70.0 in | Wt 175.0 lb

## 2018-09-16 DIAGNOSIS — R861 Abnormal level of hormones in specimens from male genital organs: Secondary | ICD-10-CM | POA: Diagnosis not present

## 2018-09-16 DIAGNOSIS — C61 Malignant neoplasm of prostate: Secondary | ICD-10-CM | POA: Diagnosis not present

## 2018-09-16 DIAGNOSIS — Z8042 Family history of malignant neoplasm of prostate: Secondary | ICD-10-CM | POA: Diagnosis not present

## 2018-09-16 DIAGNOSIS — R972 Elevated prostate specific antigen [PSA]: Secondary | ICD-10-CM | POA: Diagnosis not present

## 2018-09-16 HISTORY — DX: Malignant neoplasm of prostate: C61

## 2018-09-16 NOTE — Progress Notes (Signed)
Radiation Oncology         [phone]) [phone] ________________________________  Initial outpatient Consultation - Conducted via Telephone due to current COVID-19 concerns for limiting patient exposure  Name: [name] MRN: [id number]  Date: 09/16/2018  DOB: [birth year]  [id number], Thayer Jew, MD  Raynelle Bring, MD   REFERRING PHYSICIAN: Raynelle Bring, MD  DIAGNOSIS: 73 y.o. gentleman with Stage T1c adenocarcinoma of the prostate with Gleason score of 4+4, and PSA of 2.84.    ICD-10-CM   1. Malignant neoplasm of prostate ([place])  C61     HISTORY OF PRESENT ILLNESS: [name] is a 73 y.o. male with a diagnosis of prostate cancer. He has been followed by Dr. Alinda Money for low testosterone and treated with TRT for many years. He was noted to have an elevated PSA of 4.55 in 11/2017,  digital rectal examination performed at that time was normal. He underwent an initial transrectal ultrasound biopsy of the prostate on 01/14/2018, which showed Gleason 3+3 in 3/12 cores. After thorough discussion of treatment options with his urologist, he elected to proceed with active surveillance.  He underwent prostate MRI on 07/29/2018 as part of his surveillance, which showed a 9 mm ill-defined peripheral zone nodule in the left posterior mid gland without evidence of extracapsular extension, seminal vesicle involvement, lymphadenopathy or pelvic metastatic disease. A repeat PSA performed on 08/04/2018 was decreased at 2.84.  Given the findings on MRI, the patient proceeded to MRI fusion biopsy on 08/22/2018.  The prostate volume measured 37.2 cc.  Out of a total of 30 core biopsies, 9 were positive and 2 showed HG/PIN.  The maximum Gleason score was 4+4, and this was seen in one right mid core. Gleason 3+4 was seen in one right apex lateral, one right mid lateral (small focus), and one left base (with perineural invasion) and Gleason 3+3 was seen in one left mid, one left apex, second left base, and both left base lateral  (both with perineural invasion). None of the ROI lesion samples were positive for malignancy.  He underwent bone scan on 09/08/2018, which showed a focal area of increased osseous remodeling in the inferior right scapula, possible representing metastatic disease. As recommended, he proceeded with further imaging evaluation of this area with chest/right scapula x-rays on 09/11/2018, which showed no lytic lesion of the scapula.  The patient reviewed the biopsy results with his urologist and he has kindly been referred today for discussion of potential radiation treatment options. He is leaning towards prostatectomy and has already been cleared for surgery by his cardiologist, Dr. Ellyn Hack.   PREVIOUS RADIATION THERAPY: No  PAST MEDICAL HISTORY:  Past Medical History:  Diagnosis Date   Arthritis    CAD in native artery August 2012   Cardiac catheterization for abnormal treadmill portion of Myoview stress test -with diffuse market ST depressions in the inferior leads with ST elevations in V1 and V2; no ischemia or infarction on images --> 70% distal left main, 90% ostial LAD with 60-70% lesions in proximal. Minimal RCA or circumflex disease. Subtotal proximal rPDA: Referred for CABG   Cancer (HCC)    skin cancers-squamous cell / facial    Dyslipidemia - low HDL    Lipids monitored by Dr. Shelia Media.   GERD (gastroesophageal reflux disease)    History of multiple allergies    History of vertigo    Hypertension    Labile   Low testosterone    Recently stopped replacement therapy.   Post-nasal drip  Prostate cancer ([place])    S/P CABG x 12 August 2010   LIMA-LAD, SVG-PDA, SVG to OM, SVG-D1.   Urinary incontinence, urge    Wears glasses       PAST SURGICAL HISTORY: Past Surgical History:  Procedure Laterality Date   arthroscopy of knees bilat      CARDIAC CATHETERIZATION  08/18/2010   Terminal left main 70%, ostial LAD 90%, sequential 60-70% lesion in the proximal LAD, mild  circumflex lesions, but large OM1 and OM2. RCA had moderate irregularities and a subtotal proximal PDA   CARDIOVASCULAR STRESS TEST  02/06/2011   EKG negative for ischemia. No significant wall motion abnormalities noted.   CHOLECYSTECTOMY N/A 07/29/2015   Procedure: LAPAROSCOPIC CHOLECYSTECTOMY WITH INTRAOPERATIVE CHOLANGIOGRAM;  Surgeon: Jackolyn Confer, MD;  Location: [facility];  Service: General;  Laterality: N/A;   CORONARY ARTERY BYPASS GRAFT  08/29/2010   x4. LIMA to LAD, SVG to diag, SVG to OM, SVG to PDA. Rt thigh vein harvest   NM MYOVIEW LTD  06/27/2015   EF 60%. No ischemia or infarction. NORMAL STUDY.   nodule on testicle      PRE CABG DOPPLER  08/25/2010   No significant extracranial carotid artery stenosis. ECA stenosis noted bilaterally - ICA/CCA ratio id 0.9-rt and 0.74-lft   skin cancer removed      times 3   TONSILLECTOMY     TRANSTHORACIC ECHOCARDIOGRAM  07/27/2010   EF >55%, mild concentric LVH, stage 1 diastolic dysfunction    FAMILY HISTORY:  Family History  Problem Relation Age of Onset   Cancer Father        RENAL CA -DIED AGE 7O   Heart disease Maternal Grandfather    Heart failure Brother        ? heart dz.   Heart disease Brother    Prostate cancer Brother    Colon cancer Maternal Aunt    Colon cancer Maternal Aunt    Breast cancer Neg Hx    Pancreatic cancer Neg Hx     SOCIAL HISTORY:  Social History   Socioeconomic History   Marital status: Married    Spouse name: Malachy Mood    Number of children: 2   Years of education: Not on file   Highest education level: Not on file  Occupational History    Comment: retired  Scientist, product/process development strain: Not on file   Food insecurity    Worry: Not on file    Inability: Not on Lexicographer needs    Medical: Not on file    Non-medical: Not on file  Tobacco Use   Smoking status: Never Smoker   Smokeless tobacco: Former Systems developer    Types: Chew  Substance and Sexual  Activity   Alcohol use: No   Drug use: No   Sexual activity: Yes  Lifestyle   Physical activity    Days per week: Not on file    Minutes per session: Not on file   Stress: Not on file  Relationships   Social connections    Talks on phone: Not on file    Gets together: Not on file    Attends religious service: Not on file    Active member of club or organization: Not on file    Attends meetings of clubs or organizations: Not on file    Relationship status: Not on file   Intimate partner violence    Fear of current or ex partner: Not on file  Emotionally abused: Not on file    Physically abused: Not on file    Forced sexual activity: Not on file  Other Topics Concern   Not on file  Social History Narrative   He is a married father of 2.   He does not smoke or drink alcohol.   Up until recently when he hurt his left knee, he was continuing to exercise vigorously at least 2-3 times a week sometimes up to 2 hours of time doing cardiovascular and muscle strength exercises.    ALLERGIES: Cephalosporins, Triamterene-hctz, Statins, Vioxx [rofecoxib], Lisinopril, and Sulfa antibiotics  MEDICATIONS:  Current Outpatient Medications  Medication Sig Dispense Refill   acetaminophen (TYLENOL) 325 MG tablet Take 650 mg by mouth. Take 2  Daily as needed     aspirin EC 81 MG tablet Take 1 tablet (81 mg total) by mouth daily. 90 tablet 3   Azelaic Acid (FINACEA) 15 % cream Apply topically as needed. After skin is thoroughly washed and patted dry, gently but thoroughly massage a thin film of azelaic acid cream into the affected area twice daily, in the morning and evening.      Calcium Carbonate-Vitamin D (CALCIUM 600+D) 600-400 MG-UNIT per tablet Take 1 tablet by mouth daily.       cetirizine (ZYRTEC) 10 MG tablet Take 10 mg by mouth daily.     Coenzyme Q10 (CO Q-10) 100 MG CAPS Take 1 tablet by mouth daily.      diphenhydrAMINE (BENADRYL) 25 MG tablet Take 25 mg by mouth every  6 (six) hours as needed.     doxycycline (VIBRAMYCIN) 100 MG capsule Take 100 mg by mouth daily as needed (rosacea).      fexofenadine-pseudoephedrine (ALLEGRA-D 24) 180-240 MG 24 hr tablet Take 1 tablet by mouth daily.     Fish Oil OIL Take 1,200 mg by mouth daily.      losartan-hydrochlorothiazide (HYZAAR) 100-12.5 MG tablet Take 1 tablet by mouth daily.     metoprolol tartrate (LOPRESSOR) 25 MG tablet Take 1 tablet (25 mg total) by mouth 2 (two) times daily. KEEP OV. 180 tablet 0   pantoprazole (PROTONIX) 40 MG tablet Take 40 mg by mouth daily.       Pediatric Multiple Vit-C-FA (PEDIATRIC MULTIVITAMIN) chewable tablet Chew 1 tablet by mouth daily.     pseudoephedrine (SUDAFED) 30 MG tablet Take 30 mg by mouth every 4 (four) hours as needed for congestion.     rosuvastatin (CRESTOR) 5 MG tablet Take 5 mg by mouth. Pt takes 5 mg daily at bedtime     sildenafil (REVATIO) 20 MG tablet Take 60 mg by mouth daily.      vitamin B-12 (CYANOCOBALAMIN) 1000 MCG tablet Take 5,000 mcg by mouth daily. 5000 mcg every other day     No current facility-administered medications for this encounter.     REVIEW OF SYSTEMS:  On review of systems, the patient reports that he is doing well overall. He denies any chest pain, shortness of breath, cough, fevers, chills, night sweats, unintended weight changes. He denies any bowel disturbances, and denies abdominal pain, nausea or vomiting. He denies any new musculoskeletal or joint aches or pains. His IPSS was 9, indicating moderate urinary symptoms. He reports occasional urinary leakage associated with urinary urgency. His SHIM was 19, indicating he has moderate erectile dysfunction but responsive to medications.  A complete review of systems is obtained and is otherwise negative.  PHYSICAL EXAM:  Wt Readings from Last 3 Encounters:  09/16/18  175 lb (79.4 kg)  09/10/18 180 lb 12.8 oz (82 kg)  07/31/17 183 lb 9.6 oz (83.3 kg)   Temp Readings from Last 3  Encounters:  07/29/15 98.2 F (36.8 C) (Oral)  07/19/15 98 F (36.7 C) (Oral)   BP Readings from Last 3 Encounters:  09/10/18 (!) 151/89  07/31/17 (!) 144/76  07/26/16 122/70   Pulse Readings from Last 3 Encounters:  09/10/18 69  07/31/17 68  07/26/16 69   Pain Assessment Pain Score: 0-No pain/10  Unable to assess due to telephone consult visit format.  KPS = 90  100 - Normal; no complaints; no evidence of disease. 90   - Able to carry on normal activity; minor signs or symptoms of disease. 80   - Normal activity with effort; some signs or symptoms of disease. 64   - Cares for self; unable to carry on normal activity or to do active work. 60   - Requires occasional assistance, but is able to care for most of his personal needs. 50   - Requires considerable assistance and frequent medical care. 75   - Disabled; requires special care and assistance. 22   - Severely disabled; hospital admission is indicated although death not imminent. 41   - Very sick; hospital admission necessary; active supportive treatment necessary. 10   - Moribund; fatal processes progressing rapidly. 0     - Dead  Karnofsky DA, Abelmann [place], Craver LS and Burchenal [facility] [phone]) The use of the nitrogen mustards in the palliative treatment of carcinoma: with particular reference to bronchogenic carcinoma Cancer 1 634-56  LABORATORY DATA:  Lab Results  Component Value Date   WBC 5.9 07/19/2015   HGB 16.2 07/19/2015   HCT 47.3 07/19/2015   MCV 87.9 07/19/2015   PLT 210 07/19/2015   Lab Results  Component Value Date   NA 138 03/05/2016   K 4.2 03/05/2016   CL 99 03/05/2016   CO2 30 03/05/2016   Lab Results  Component Value Date   ALT 21 03/05/2016   AST 23 03/05/2016   ALKPHOS 55 03/05/2016   BILITOT 0.8 03/05/2016     RADIOGRAPHY: Dg Chest 2 View  Result Date: 09/11/2018 CLINICAL DATA:  History of prostate cancer, abnormal findings of whole-body scintigraphic bone scan EXAM: CHEST - 2 VIEW;  RIGHT SCAPULA - 2+ VIEWS COMPARISON:  Whole body nuclear scintigraphic bone scan, 09/08/2018, chest radiograph, 09/21/2010 FINDINGS: Status post median sternotomy and CABG. Both lungs are clear. The visualized skeletal structures are unremarkable. No fracture or dislocation of the right scapula. There is no lytic lesion, cortical disruption or other obvious abnormality of the scapula with particular attention to the scapular tip. Included shoulder is unremarkable. IMPRESSION: 1.  No acute abnormality of the lungs. 2. No fracture or dislocation of the right scapula. There is no lytic lesion, cortical disruption or other obvious abnormality of the scapula with particular attention to the scapular tip. CT or MRI may be used to most sensitively evaluate for osseous metastatic disease if there is high clinical concern for metastatic prostate malignancy, however isolated disease of the scapula would be an unusual presentation of osseous metastatic disease. Electronically Signed   By: Eddie Candle M.D.   On: 09/11/2018 15:20   Dg Scapula Right  Result Date: 09/11/2018 CLINICAL DATA:  History of prostate cancer, abnormal findings of whole-body scintigraphic bone scan EXAM: CHEST - 2 VIEW; RIGHT SCAPULA - 2+ VIEWS COMPARISON:  Whole body nuclear scintigraphic bone scan, 09/08/2018, chest radiograph,  09/21/2010 FINDINGS: Status post median sternotomy and CABG. Both lungs are clear. The visualized skeletal structures are unremarkable. No fracture or dislocation of the right scapula. There is no lytic lesion, cortical disruption or other obvious abnormality of the scapula with particular attention to the scapular tip. Included shoulder is unremarkable. IMPRESSION: 1.  No acute abnormality of the lungs. 2. No fracture or dislocation of the right scapula. There is no lytic lesion, cortical disruption or other obvious abnormality of the scapula with particular attention to the scapular tip. CT or MRI may be used to most  sensitively evaluate for osseous metastatic disease if there is high clinical concern for metastatic prostate malignancy, however isolated disease of the scapula would be an unusual presentation of osseous metastatic disease. Electronically Signed   By: Eddie Candle M.D.   On: 09/11/2018 15:20   Nm Bone Scan Whole Body  Result Date: 09/08/2018 CLINICAL DATA:  PROSTATE CA PSA 2.84 NO BONE PAIN, HX FX OR RECENT TRAUMA EXAM: NUCLEAR MEDICINE WHOLE BODY BONE SCAN TECHNIQUE: Whole body anterior and posterior images were obtained approximately 3 hours after intravenous injection of radiopharmaceutical. RADIOPHARMACEUTICALS:  21.0 mCi Technetium-59m MDP IV COMPARISON:  None. FINDINGS: There is appropriate activity within the kidneys and bladder. A focus of increased activity is identified in the inferior RIGHT scapula. Focal areas of increased osseous remodeling are identified in the LEFT posterior iliac wing, at the SI joint. Likely degenerative changes are identified in the sternoclavicular joints, LOWER lumbar spine, and LEFT knee. IMPRESSION: 1. Focal area of increased osseous remodeling in the inferior RIGHT scapula, possibly representing metastatic disease. Consider plain film correlation. 2. Other changes likely representing degenerative joint disease. Electronically Signed   By: Nolon Nations M.D.   On: 09/08/2018 14:20      IMPRESSION/PLAN: 1. 73 y.o. gentleman with Stage T1c adenocarcinoma of the prostate with Gleason Score of 4+4, and PSA of 2.84.  We discussed the patient's workup and outlined the nature of prostate cancer in this setting. The patient's Gleason's score puts him into the high risk group. Accordingly, he is eligible for a variety of potential treatment options including prostatectomy or long-term androgen deprivation therapy (ADT) in combination with either 8 weeks of external radiation or 5 weeks of external radiation combined with a brachytherapy boost. We discussed the available  radiation techniques, and focused on the details and logistics and delivery. We discussed and outlined the risks, benefits, short and long-term effects associated with radiotherapy and compared and contrasted these with prostatectomy. We also detailed the role of ADT in the treatment of high risk prostate cancer and outlined the associated side effects that could be expected with this therapy.  The patient focused most of his questions and interest in robotic-assisted laparoscopic radical prostatectomy.  We discussed some of the potential advantages of surgery including surgical staging, the availability of salvage radiotherapy to the prostatic fossa, and the confidence associated with immediate biochemical response. We discussed some of the potential proven indications for postoperative radiotherapy including positive margins, extracapsular extension, and seminal vesicle involvement. We also talked about some of the other potential findings leading to a recommendation for radiotherapy including a non-zero postoperative PSA and positive lymph nodes.   At the end of the conversation the patient is interested in moving forward with prostatectomy. We will share our discussion with Dr. Alinda Money and look forward to following along in the care of this very nice gentleman.  Of course, we would be more than happy to continue to  participate in his care if clinically indicated in the future.   Given current concerns for patient exposure during the COVID-19 pandemic, this encounter was conducted via telephone. The patient was notified in advance and was offered a MyChart meeting to allow for face to face communication but unfortunately reported that he did not have the appropriate resources/technology to support such a visit and instead preferred to proceed with telephone consult. The patient has given verbal consent for this type of encounter. The time spent during this encounter was 55 minutes. The attendants for  this meeting include Tyler Pita MD, Ashlyn Bruning PA-C, [place], patient, Reynol Samons and his wife. During the encounter, Tyler Pita MD, Ashlyn Bruning PA-C, and scribe, Wilburn Mylar were located at [place].  Patient, Robby Burroughs and his wife were located at home.    Nicholos Johns, PA-C    Tyler Pita, MD  [facility] Direct Dial: [phone]   Fax: [email or web address]   Skype   [email or web address]  This document serves as a record of services personally performed by Tyler Pita, MD and Freeman Caldron, PA-C. It was created on their behalf by Wilburn Mylar, a trained medical scribe. The creation of this record is based on the scribe's personal observations and the provider's statements to them. This document has been checked and approved by the attending provider.

## 2018-09-16 NOTE — Progress Notes (Signed)
See progress note under physician encounter. 

## 2018-09-16 NOTE — Progress Notes (Signed)
GU Location of Tumor / Histology: prostatic adenocarcinoma  If Prostate Cancer, Gleason Score is (4 + 4) and PSA is (2.84). Prostate volume 37.2 grams.  BEE HEFEL was being followed by Dr. Alinda Money for low testosterone when a PSA of 4.55 was noted. Since that time the patient has had two prostate biopsies. The initial biopsy was done 01/14/2018 and the repeat biopsy done 08/22/2018.  Biopsies of prostate (if applicable) revealed:    Past/Anticipated interventions by urology, if any: testosterone, discontinued testosterone, prostate biopsy, surveillance, MRI fusion biopsy, referral for consideration of radiotherapy  Past/Anticipated interventions by medical oncology, if any: no  Weight changes, if any: no  Bowel/Bladder complaints, if any: IPSS 9. SHIM 19 with aid of medication. Patient states, "you are getting too personal" while nurse attempted to ask questions associated with SHIM assessment. Denies dysuria or hematuria. Reports occasional urinary leakage associated with urinary urgency. Denies any bowel complaints.   Nausea/Vomiting, if any: no  Pain issues, if any:  no  SAFETY ISSUES:  Prior radiation? no  Pacemaker/ICD? no  Possible current pregnancy? no, male patient  Is the patient on methotrexate? no  Current Complaints / other details:  73 year old male. Married.

## 2018-09-23 ENCOUNTER — Telehealth: Payer: Self-pay | Admitting: Medical Oncology

## 2018-09-23 DIAGNOSIS — C61 Malignant neoplasm of prostate: Secondary | ICD-10-CM | POA: Diagnosis not present

## 2018-09-23 NOTE — Progress Notes (Signed)
Called [name] to introduce myself as the prostate nurse navigator. I was unable to meet him 9/8 when he consulted with Dr. Tammi Klippel. He consulted with Dr. Alinda Money today and has decided to move forward with surgery. He has not been scheduled. I wished him well and asked him to call me if I can assist him in any way. He thanked me for following up and voiced understanding.

## 2018-09-29 ENCOUNTER — Ambulatory Visit: Payer: Medicare Other | Admitting: Cardiology

## 2018-09-30 DIAGNOSIS — C61 Malignant neoplasm of prostate: Secondary | ICD-10-CM | POA: Diagnosis not present

## 2018-09-30 DIAGNOSIS — M6281 Muscle weakness (generalized): Secondary | ICD-10-CM | POA: Diagnosis not present

## 2018-09-30 DIAGNOSIS — N3941 Urge incontinence: Secondary | ICD-10-CM | POA: Diagnosis not present

## 2018-09-30 DIAGNOSIS — M62838 Other muscle spasm: Secondary | ICD-10-CM | POA: Diagnosis not present

## 2018-10-03 ENCOUNTER — Other Ambulatory Visit: Payer: Self-pay | Admitting: Urology

## 2018-10-06 DIAGNOSIS — Z23 Encounter for immunization: Secondary | ICD-10-CM | POA: Diagnosis not present

## 2018-10-07 ENCOUNTER — Encounter (HOSPITAL_COMMUNITY): Payer: Self-pay

## 2018-10-07 NOTE — Patient Instructions (Addendum)
DUE TO COVID-19 ONLY ONE VISITOR IS ALLOWED TO COME WITH YOU AND STAY IN THE WAITING ROOM ONLY DURING PRE OP AND PROCEDURE. THE ONE VISITOR MAY VISIT WITH YOU IN YOUR PRIVATE ROOM DURING VISITING HOURS ONLY!!   COVID SWAB TESTING COMPLETED ON:  Today at 12:55PM.  (Must self quarantine after testing. Follow instructions on handout.)             Your procedure is scheduled on: Monday, Oct. 5, 2020   Report to [facility] Main  Entrance    Report to admitting at 10:30 AM   Call this number if you have problems the morning of surgery [phone]   Do not eat food or drink liquids :After Midnight.   Brush your teeth the morning of surgery.   Do NOT smoke after Midnight   Take these medicines the morning of surgery with A SIP OF WATER: Metoprolol, Pantoprazole, Zyrtec                               You may not have any metal on your body including jewelry, and body piercings             Do not wear lotions, powders, perfumes/cologne, or deodorant                           Men may shave face and neck.   Do not bring valuables to the hospital. [place].   Contacts, dentures or bridgework may not be worn into surgery.   Bring small overnight bag day of surgery.    Special Instructions: Bring a copy of your healthcare power of attorney and living will documents         the day of surgery if you haven't scanned them in before.              Please read over the following fact sheets you were given:  [facility] - Preparing for Surgery Before surgery, you can play an important role.  Because skin is not sterile, your skin needs to be as free of germs as possible.  You can reduce the number of germs on your skin by washing with CHG (chlorahexidine gluconate) soap before surgery.  CHG is an antiseptic cleaner which kills germs and bonds with the skin to continue killing germs even after washing. Please DO NOT use if you have an  allergy to CHG or antibacterial soaps.  If your skin becomes reddened/irritated stop using the CHG and inform your nurse when you arrive at Short Stay. Do not shave (including legs and underarms) for at least 48 hours prior to the first CHG shower.  You may shave your face/neck.  Please follow these instructions carefully:  1.  Shower with CHG Soap the night before surgery and the  morning of surgery.  2.  If you choose to wash your hair, wash your hair first as usual with your normal  shampoo.  3.  After you shampoo, rinse your hair and body thoroughly to remove the shampoo.                             4.  Use CHG as you would any other liquid soap.  You can apply  chg directly to the skin and wash.  Gently with a scrungie or clean washcloth.  5.  Apply the CHG Soap to your body ONLY FROM THE NECK DOWN.   Do   not use on face/ open                           Wound or open sores. Avoid contact with eyes, ears mouth and   genitals (private parts).                       Wash face,  Genitals (private parts) with your normal soap.             6.  Wash thoroughly, paying special attention to the area where your    surgery  will be performed.  7.  Thoroughly rinse your body with warm water from the neck down.  8.  DO NOT shower/wash with your normal soap after using and rinsing off the CHG Soap.                9.  Pat yourself dry with a clean towel.            10.  Wear clean pajamas.            11.  Place clean sheets on your bed the night of your first shower and do not  sleep with pets. Day of Surgery : Do not apply any lotions/deodorants the morning of surgery.  Please wear clean clothes to the hospital/surgery center.  FAILURE TO FOLLOW THESE INSTRUCTIONS MAY RESULT IN THE CANCELLATION OF YOUR SURGERY  PATIENT SIGNATURE_________________________________  NURSE SIGNATURE__________________________________  ________________________________________________________________________   Adam Phenix  An incentive spirometer is a tool that can help keep your lungs clear and active. This tool measures how well you are filling your lungs with each breath. Taking long deep breaths may help reverse or decrease the chance of developing breathing (pulmonary) problems (especially infection) following:  A long period of time when you are unable to move or be active. BEFORE THE PROCEDURE   If the spirometer includes an indicator to show your best effort, your nurse or respiratory therapist will set it to a desired goal.  If possible, sit up straight or lean slightly forward. Try not to slouch.  Hold the incentive spirometer in an upright position. INSTRUCTIONS FOR USE  1. Sit on the edge of your bed if possible, or sit up as far as you can in bed or on a chair. 2. Hold the incentive spirometer in an upright position. 3. Breathe out normally. 4. Place the mouthpiece in your mouth and seal your lips tightly around it. 5. Breathe in slowly and as deeply as possible, raising the piston or the ball toward the top of the column. 6. Hold your breath for 3-5 seconds or for as long as possible. Allow the piston or ball to fall to the bottom of the column. 7. Remove the mouthpiece from your mouth and breathe out normally. 8. Rest for a few seconds and repeat Steps 1 through 7 at least 10 times every 1-2 hours when you are awake. Take your time and take a few normal breaths between deep breaths. 9. The spirometer may include an indicator to show your best effort. Use the indicator as a goal to work toward during each repetition. 10. After each set of 10 deep breaths, practice coughing to be sure your lungs  are clear. If you have an incision (the cut made at the time of surgery), support your incision when coughing by placing a pillow or rolled up towels firmly against it. Once you are able to get out of bed, walk around indoors and cough well. You may stop using the incentive spirometer when  instructed by your caregiver.  RISKS AND COMPLICATIONS  Take your time so you do not get dizzy or light-headed.  If you are in pain, you may need to take or ask for pain medication before doing incentive spirometry. It is harder to take a deep breath if you are having pain. AFTER USE  Rest and breathe slowly and easily.  It can be helpful to keep track of a log of your progress. Your caregiver can provide you with a simple table to help with this. If you are using the spirometer at home, follow these instructions: [place] IF:   You are having difficultly using the spirometer.  You have trouble using the spirometer as often as instructed.  Your pain medication is not giving enough relief while using the spirometer.  You develop fever of 100.5 F (38.1 C) or higher. SEEK IMMEDIATE MEDICAL CARE IF:   You cough up bloody sputum that had not been present before.  You develop fever of 102 F (38.9 C) or greater.  You develop worsening pain at or near the incision site. MAKE SURE YOU:   Understand these instructions.  Will watch your condition.  Will get help right away if you are not doing well or get worse. Document Released: 05/07/2006 Document Revised: 03/19/2011 Document Reviewed: 07/08/2006 ExitCare Patient Information 2014 ExitCare, [place].   ________________________________________________________________________  WHAT IS A BLOOD TRANSFUSION? Blood Transfusion Information  A transfusion is the replacement of blood or some of its parts. Blood is made up of multiple cells which provide different functions.  Red blood cells carry oxygen and are used for blood loss replacement.  White blood cells fight against infection.  Platelets control bleeding.  Plasma helps clot blood.  Other blood products are available for specialized needs, such as hemophilia or other clotting disorders. BEFORE THE TRANSFUSION  Who gives blood for transfusions?   Healthy  volunteers who are fully evaluated to make sure their blood is safe. This is blood bank blood. Transfusion therapy is the safest it has ever been in the practice of medicine. Before blood is taken from a donor, a complete history is taken to make sure that person has no history of diseases nor engages in risky social behavior (examples are intravenous drug use or sexual activity with multiple partners). The donor's travel history is screened to minimize risk of transmitting infections, such as malaria. The donated blood is tested for signs of infectious diseases, such as HIV and hepatitis. The blood is then tested to be sure it is compatible with you in order to minimize the chance of a transfusion reaction. If you or a relative donates blood, this is often done in anticipation of surgery and is not appropriate for emergency situations. It takes many days to process the donated blood. RISKS AND COMPLICATIONS Although transfusion therapy is very safe and saves many lives, the main dangers of transfusion include:   Getting an infectious disease.  Developing a transfusion reaction. This is an allergic reaction to something in the blood you were given. Every precaution is taken to prevent this. The decision to have a blood transfusion has been considered carefully by your caregiver before blood  is given. Blood is not given unless the benefits outweigh the risks. AFTER THE TRANSFUSION  Right after receiving a blood transfusion, you will usually feel much better and more energetic. This is especially true if your red blood cells have gotten low (anemic). The transfusion raises the level of the red blood cells which carry oxygen, and this usually causes an energy increase.  The nurse administering the transfusion will monitor you carefully for complications. HOME CARE INSTRUCTIONS  No special instructions are needed after a transfusion. You may find your energy is better. Speak with your caregiver about any  limitations on activity for underlying diseases you may have. SEEK MEDICAL CARE IF:   Your condition is not improving after your transfusion.  You develop redness or irritation at the intravenous (IV) site. SEEK IMMEDIATE MEDICAL CARE IF:  Any of the following symptoms occur over the next 12 hours:  Shaking chills.  You have a temperature by mouth above 102 F (38.9 C), not controlled by medicine.  Chest, back, or muscle pain.  People around you feel you are not acting correctly or are confused.  Shortness of breath or difficulty breathing.  Dizziness and fainting.  You get a rash or develop hives.  You have a decrease in urine output.  Your urine turns a dark color or changes to pink, red, or brown. Any of the following symptoms occur over the next 10 days:  You have a temperature by mouth above 102 F (38.9 C), not controlled by medicine.  Shortness of breath.  Weakness after normal activity.  The white part of the eye turns yellow (jaundice).  You have a decrease in the amount of urine or are urinating less often.  Your urine turns a dark color or changes to pink, red, or brown. Document Released: 12/23/1999 Document Revised: 03/19/2011 Document Reviewed: 08/11/2007 [facility] Patient Information 2014 [place], [place].  _______________________________________________________________________

## 2018-10-09 ENCOUNTER — Encounter (HOSPITAL_COMMUNITY): Payer: Self-pay

## 2018-10-09 ENCOUNTER — Other Ambulatory Visit (HOSPITAL_COMMUNITY)
Admission: RE | Admit: 2018-10-09 | Discharge: 2018-10-09 | Disposition: A | Discharge status: Home / Self Care | Payer: Medicare Other | Source: Ambulatory Visit | Attending: Urology | Admitting: Urology

## 2018-10-09 ENCOUNTER — Other Ambulatory Visit: Payer: Self-pay

## 2018-10-09 ENCOUNTER — Encounter (HOSPITAL_COMMUNITY)
Admission: RE | Admit: 2018-10-09 | Discharge: 2018-10-09 | Disposition: A | Discharge status: Home / Self Care | Payer: Medicare Other | Source: Ambulatory Visit | Attending: Urology | Admitting: Urology

## 2018-10-09 DIAGNOSIS — C61 Malignant neoplasm of prostate: Secondary | ICD-10-CM | POA: Insufficient documentation

## 2018-10-09 DIAGNOSIS — Z20828 Contact with and (suspected) exposure to other viral communicable diseases: Secondary | ICD-10-CM | POA: Insufficient documentation

## 2018-10-09 DIAGNOSIS — Z01812 Encounter for preprocedural laboratory examination: Secondary | ICD-10-CM | POA: Diagnosis not present

## 2018-10-09 HISTORY — DX: Trigger finger, left middle finger: M65.332

## 2018-10-09 HISTORY — DX: Trigger finger, right ring finger: M65.341

## 2018-10-09 HISTORY — DX: Trigger finger, right middle finger: M65.331

## 2018-10-09 HISTORY — DX: Pneumonia, unspecified organism: J18.9

## 2018-10-09 LAB — CBC
HCT: 44.4 % (ref 39.0–52.0)
Hemoglobin: 15 g/dL (ref 13.0–17.0)
MCH: 30.5 pg (ref 26.0–34.0)
MCHC: 33.8 g/dL (ref 30.0–36.0)
MCV: 90.4 fL (ref 80.0–100.0)
Platelets: 224 10*3/uL (ref 150–400)
RBC: 4.91 MIL/uL (ref 4.22–5.81)
RDW: 12.6 % (ref 11.5–15.5)
WBC: 5.3 10*3/uL (ref 4.0–10.5)
nRBC: 0 % (ref 0.0–0.2)

## 2018-10-09 LAB — BASIC METABOLIC PANEL
Anion gap: 9 (ref 5–15)
BUN: 21 mg/dL (ref 8–23)
CO2: 27 mmol/L (ref 22–32)
Calcium: 9 mg/dL (ref 8.9–10.3)
Chloride: 99 mmol/L (ref 98–111)
Creatinine, Ser: 1.05 mg/dL (ref 0.61–1.24)
GFR calc Af Amer: >60 mL/min (ref >60)
GFR calc non Af Amer: >60 mL/min (ref >60)
Glucose, Bld: 111 mg/dL — ABNORMAL HIGH (ref 70–99)
Potassium: 4 mmol/L (ref 3.5–5.1)
Sodium: 135 mmol/L (ref 135–145)

## 2018-10-09 NOTE — Progress Notes (Signed)
PCP - Dr. Shelia Media Cardiologist - Dr. Roni Bread Last office visit and cardiac clearance 09/11/2018 in epic  Chest x-ray - 09/11/2018 in epic EKG - 09/10/2018 in epic Stress Test -  Greater than 2 years (2017) ECHO - Greater than 2 years  (2012) Cardiac Cath - Greater than 2 years  (2012)  Sleep Study - Years ago per pt CPAP - N/A  Fasting Blood Sugar - N/A Checks Blood Sugar __N/A___ times a day  Blood Thinner Instructions:N/A Aspirin Instructions:  Yes Last Dose: 10/05/2018  Anesthesia review: CAD, CABG, HTN  Patient denies shortness of breath, fever, cough and chest pain at PAT appointment   Patient verbalized understanding of instructions that were given to them at the PAT appointment. Patient was also instructed that they will need to review over the PAT instructions again at home before surgery.

## 2018-10-09 NOTE — Progress Notes (Signed)
SPOKE W/  [name]     SCREENING SYMPTOMS OF COVID 19:   COUGH--NO  RUNNY NOSE--- NO  SORE THROAT---NO  NASAL CONGESTION----NO  SNEEZING----NO  SHORTNESS OF BREATH---NO  DIFFICULTY BREATHING---NO  TEMP >100.0 -----NO  UNEXPLAINED BODY ACHES------NO  CHILLS --------NO   HEADACHES ---------NO  LOSS OF SMELL/ TASTE --------NO    HAVE YOU OR ANY FAMILY MEMBER TRAVELLED PAST 14 DAYS OUT OF THE   COUNTY---NO STATE----NO COUNTRY----NO  HAVE YOU OR ANY FAMILY MEMBER BEEN EXPOSED TO ANYONE WITH COVID 19? NO

## 2018-10-10 LAB — ABO/RH: ABO/RH(D): O POS

## 2018-10-10 LAB — NOVEL CORONAVIRUS, NAA (HOSP ORDER, SEND-OUT TO REF LAB; TAT 18-24 HRS): SARS-CoV-2, NAA: NOT DETECTED

## 2018-10-10 NOTE — Anesthesia Preprocedure Evaluation (Addendum)
Anesthesia Evaluation  Patient identified by MRN, date of birth, ID band Patient awake    Reviewed: Allergy & Precautions, NPO status , Patient's Chart, lab work & pertinent test results, reviewed documented beta blocker date and time   Airway Mallampati: II  TM Distance: >3 FB Neck ROM: Full    Dental no notable dental hx. (+) Teeth Intact, Dental Advisory Given   Pulmonary neg pulmonary ROS,    Pulmonary exam normal breath sounds clear to auscultation       Cardiovascular hypertension, Pt. on medications and Pt. on home beta blockers + CAD and + CABG  Normal cardiovascular exam Rhythm:Regular Rate:Normal  S/p CABG x 4 2012: LIMA-LAD, SVG-PDA, SVG to OM, SVG-D1.   Myocardial Perfusion Imaging 06/28/2015  The left ventricular ejection fraction is normal (55-65%).  Nuclear stress EF: 60%.  Blood pressure demonstrated a hypertensive response to exercise.  Upsloping ST segment depression ST segment depression was noted during stress in the III, II, aVF, V6, V5 and V4 leads.  No T wave inversion was noted during stress.  The study is normal.   Neuro/Psych vertigo negative psych ROS   GI/Hepatic Neg liver ROS, GERD  Medicated,  Endo/Other  negative endocrine ROS  Renal/GU Renal InsufficiencyRenal disease Bladder dysfunction  Prostate ca Urinary incontinence    Musculoskeletal  (+) Arthritis , Osteoarthritis,    Abdominal Normal abdominal exam  (+)   Peds  Hematology negative hematology ROS (+)   Anesthesia Other Findings   Reproductive/Obstetrics negative OB ROS                                                            Anesthesia Evaluation  Patient identified by MRN, date of birth, ID band Patient awake    Reviewed: Allergy & Precautions, NPO status , Patient's Chart, lab work & pertinent test results  History of Anesthesia Complications Negative for: history of anesthetic  complications  Airway Mallampati: II  TM Distance: >3 FB Neck ROM: Full    Dental  (+) Teeth Intact, Dental Advisory Given   Pulmonary neg pulmonary ROS,    Pulmonary exam normal        Cardiovascular hypertension, + CAD and + CABG  Normal cardiovascular exam  2017 Myoview     The left ventricular ejection fraction is normal (55-65%).     Nuclear stress EF: 60%.     Blood pressure demonstrated a hypertensive response to exercise.     Upsloping ST segment depression ST segment depression was noted during stress in the III, II, aVF, V6, V5 and V4 leads.     No T wave inversion was noted during stress.     The study is normal.   Neuro/Psych negative neurological ROS  negative psych ROS   GI/Hepatic Neg liver ROS, GERD  ,  Endo/Other  negative endocrine ROS  Renal/GU negative Renal ROS     Musculoskeletal   Abdominal   Peds  Hematology   Anesthesia Other Findings   Reproductive/Obstetrics                           Anesthesia Physical Anesthesia Plan  ASA: III  Anesthesia Plan: General   Post-op Pain Management:    Induction: Intravenous  Airway Management Planned: Oral ETT  Additional Equipment:   Intra-op Plan:   Post-operative Plan: Extubation in OR  Informed Consent: I have reviewed the patients History and Physical, chart, labs and discussed the procedure including the risks, benefits and alternatives for the proposed anesthesia with the patient or authorized representative who has indicated his/her understanding and acceptance.   Dental advisory given  Plan Discussed with: CRNA and Anesthesiologist  Anesthesia Plan Comments:         Anesthesia Quick Evaluation  Anesthesia Physical Anesthesia Plan  ASA: III  Anesthesia Plan: General   Post-op Pain Management:    Induction: Intravenous  PONV Risk Score and Plan: 3 and Ondansetron, Dexamethasone and Treatment may vary due to age or medical  condition  Airway Management Planned: Oral ETT  Additional Equipment: None  Intra-op Plan:   Post-operative Plan: Extubation in OR  Informed Consent: I have reviewed the patients History and Physical, chart, labs and discussed the procedure including the risks, benefits and alternatives for the proposed anesthesia with the patient or authorized representative who has indicated his/her understanding and acceptance.     Dental advisory given  Plan Discussed with: CRNA  Anesthesia Plan Comments:        Anesthesia Quick Evaluation

## 2018-10-10 NOTE — Progress Notes (Signed)
Anesthesia Chart Review   Case: [id number] Date/Time: 10/13/18 1200   Procedures:      XI ROBOTIC ASSISTED LAPAROSCOPIC RADICAL PROSTATECTOMY LEVEL 2 (N/A )     LYMPHADENECTOMY, PELVIC (Bilateral )   Anesthesia type: General   Pre-op diagnosis: PROSTATE CANCER   Location: [facility] ROOM 03 / [facility]   Surgeon: Raynelle Bring, MD      DISCUSSION:73 y.o. never smoker with h/o CAD (CABG x4), HTN, GERD, prostate cancer scheduled for above procedure 10/13/2018 with Dr. Raynelle Bring.   Pt last seen by cardiologist, Dr. Glenetta Hew, 09/11/2018 for preoperative evaluation.  Per OV note, "Richardson Landry is doing very well and stable from a cardiac standpoint.  No active symptoms.  I do not see any reason to move up his surveillance stress test a year or 2 would simply because his surgery is pending.  He is fine without any angina or heart failure.  Preserved EF and ostensibly revascularized coronary arteries.  As testing would not alter my management of this gentleman, I think he can proceed to the OR without any further evaluation.  Okay to hold aspirin for procedures"  Anticipate pt can proceed with planned procedure barring acute status change.   VS: BP 135/71   Pulse 69   Temp 37.2 C (Oral)   Resp 18   Ht 5\' 10"  (1.778 m)   Wt 81.7 kg   SpO2 99%   BMI 25.85 kg/m   PROVIDERS: Deland Pretty, MD is PCP   Glenetta Hew, MD is Cardiologist  LABS: Labs reviewed: Acceptable for surgery. (all labs ordered are listed, but only abnormal results are displayed)  Labs Reviewed  BASIC METABOLIC PANEL - Abnormal; Notable for the following components:      Result Value   Glucose, Bld 111 (*)    All other components within normal limits  CBC  TYPE AND SCREEN  ABO/RH     IMAGES: Chest Xray 09/11/2018 IMPRESSION: 1.  No acute abnormality of the lungs.  2. No fracture or dislocation of the right scapula. There is no lytic lesion, cortical disruption or other obvious abnormality of the scapula with  particular attention to the scapular tip. CT or MRI may be used to most sensitively evaluate for osseous metastatic disease if there is high clinical concern for metastatic prostate malignancy, however isolated disease of the scapula would be an unusual presentation of osseous metastatic disease.  EKG: 09/10/2018 Rate 69 bpm Normal sinus rhythm  ST & T wave abnormality, consider inferolateral ischemia   CV: Myocardial Perfusion Imaging 06/28/2015  The left ventricular ejection fraction is normal (55-65%).  Nuclear stress EF: 60%.  Blood pressure demonstrated a hypertensive response to exercise.  Upsloping ST segment depression ST segment depression was noted during stress in the III, II, aVF, V6, V5 and V4 leads.  No T wave inversion was noted during stress.  The study is normal. Past Medical History:  Diagnosis Date  . Acquired trigger finger of both middle fingers   . Acquired trigger finger of both ring fingers   . Arthritis   . CAD in native artery August 2012   Cardiac catheterization for abnormal treadmill portion of Myoview stress test -with diffuse market ST depressions in the inferior leads with ST elevations in V1 and V2; no ischemia or infarction on images --> 70% distal left main, 90% ostial LAD with 60-70% lesions in proximal. Minimal RCA or circumflex disease. Subtotal proximal rPDA: Referred for CABG  . Cancer ([place])    skin  cancers-squamous cell / facial   . Dyslipidemia - low HDL    Lipids monitored by Dr. Shelia Media.  [name] GERD (gastroesophageal reflux disease)   . History of multiple allergies   . History of vertigo   . Hypertension    Labile  . Low testosterone    Recently stopped replacement therapy.  . Pneumonia   . Post-nasal drip   . Prostate cancer ([place])   . 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  . Urinary incontinence, urge   . Wears glasses     Past Surgical History:  Procedure Laterality Date  . arthroscopy of knees  bilat     . CARDIAC CATHETERIZATION  08/18/2010   Terminal left main 70%, ostial LAD 90%, sequential 60-70% lesion in the proximal LAD, mild circumflex lesions, but large OM1 and OM2. RCA had moderate irregularities and a subtotal proximal PDA  . CARDIOVASCULAR STRESS TEST  02/06/2011   EKG negative for ischemia. No significant wall motion abnormalities noted.  [name] CATARACT EXTRACTION, BILATERAL    . CHOLECYSTECTOMY N/A 07/29/2015   Procedure: LAPAROSCOPIC CHOLECYSTECTOMY WITH INTRAOPERATIVE CHOLANGIOGRAM;  Surgeon: Jackolyn Confer, MD;  Location: [facility];  Service: General;  Laterality: N/A;  . COLONOSCOPY    . CORONARY ARTERY BYPASS GRAFT  08/29/2010   x4. LIMA to LAD, SVG to diag, SVG to OM, SVG to PDA. Rt thigh vein harvest  . MOHS SURGERY     Face and Neck Left eyelid  . NM MYOVIEW LTD  06/27/2015   EF 60%. No ischemia or infarction. NORMAL STUDY.  [name] PRE CABG DOPPLER  08/25/2010   No significant extracranial carotid artery stenosis. ECA stenosis noted bilaterally - ICA/CCA ratio id 0.9-rt and 0.74-lft  . SURGERY SCROTAL / TESTICULAR     Benign nodule  . TONSILLECTOMY    . TRANSTHORACIC ECHOCARDIOGRAM  07/27/2010   EF >55%, mild concentric LVH, stage 1 diastolic dysfunction    MEDICATIONS: . acetaminophen (TYLENOL) 325 MG tablet  . aspirin EC 81 MG tablet  . Azelaic Acid (FINACEA) 15 % cream  . Calcium Carbonate-Vitamin D (CALCIUM 600+D) 600-400 MG-UNIT per tablet  . cetirizine (ZYRTEC) 10 MG tablet  . Coenzyme Q10 (CO Q-10) 100 MG CAPS  . Cyanocobalamin (VITAMIN B-12) 5000 MCG TBDP  . diphenhydrAMINE (BENADRYL) 25 MG tablet  . doxycycline (VIBRAMYCIN) 100 MG capsule  . Fish Oil OIL  . loratadine-pseudoephedrine (CLARITIN-D 12-HOUR) 5-120 MG tablet  . losartan-hydrochlorothiazide (HYZAAR) 100-12.5 MG tablet  . metoprolol tartrate (LOPRESSOR) 25 MG tablet  . pantoprazole (PROTONIX) 40 MG tablet  . Pediatric Multiple Vit-C-FA (PEDIATRIC MULTIVITAMIN) chewable tablet  .  pseudoephedrine (SUDAFED) 30 MG tablet  . rosuvastatin (CRESTOR) 5 MG tablet  . sildenafil (REVATIO) 20 MG tablet  . traMADol (ULTRAM) 50 MG tablet   No current facility-administered medications for this encounter.     Konrad Felix, PA-C [facility]-Surgical Testing [phone] 10/10/18  1:32 PM

## 2018-10-11 NOTE — H&P (Signed)
Office Visit Report     09/23/2018   --------------------------------------------------------------------------------   [name]  MRN: [id number]  DOB: 02-Jun-1945, 73 year old Male  SSN: [id number]   PRIMARY CARE:  Deland Pretty, MD  REFERRING:  Deland Pretty, MD  PROVIDER:  Raynelle Bring, M.D.  LOCATION:  [facility] [phone]     --------------------------------------------------------------------------------   CC/HPI: Prostate cancer    Mr. [name] returns to further discuss his treatment options for high risk prostate cancer. He has met with Dr. Tammi Klippel and discussed radiotherapy options. His staging studies do not suggest metastatic disease. He has a history of an elevated PSA of 4.55 and underwent a prostate biopsy in January 2020 demonstrating low risk prostate cancer. After discussion, he elected active surveillance management. He underwent a confirmatory evaluation with an MRI of the prostate in July 2020 that demonstrated a 9 mm PI-RADS 4 lesion in the left posterior mid gland. An MR/US fusion biopsy on 08/22/18 confirmed upgrade Gleason 4+4=8 adenocarcinoma with 9 out of 30 biopsies positive for malignancy. None of the MR targeted biopsies were positive. He has undergone staging studies including an MRI of the prostate with no pelvic lymphadenopathy or other concern for metastases. Bone scan from 09/08/18 indicated uptake in the right scapula. Plain films were not concerning. After his discussion with Dr. Tammi Klippel, he has elected surgical therapy for treatment.   He does have a family history of prostate cancer with his brother having been diagnosed and treated with radiation seed implant approximately 10 years ago.   Initial diagnosis: January 2020  Clinical stage: T1c Nx Mx  PSA at diagnosis: 4.55  Gleason score: 3+3=6 (Grade group 1)  Baseline IPSS: 2  Baseline SHIM score: 20   TRUS biopsy: 08/22/18  Clinical stage: T1c Nx Mx  PSA: 2.84  Gleason score:  4+4=8 (Grade group 4)  Biopsy (08/22/18): 9/30 cores positive  Right: R lateral apex (1/2 cores, 10%, 3+4=7), R mid (1/2 cores, 20%, 4+4=8), R lateral mid (1/2 cores, 5%, 3+4=7)  Left: L apex (1/2 cores, 20%, 3+3=6), L mid (1/2 cores, 10%, 3+3=6), L lateral base (2/2 cores, 50%, 50%, 3+3=6, PNI), L base (2/2 cores, 50% 3+4=7, 50%, 3+3=6, PNI)  Targeted: Benign  Prostate volume: 37.2 cc   IPSS: 14  SHIM score: 11     ALLERGIES: Cephalosporins - heart rate increase, weakness Latex Lipitor - elevated muscle enzymes Lisinopril TABS - coughing Maxide - increase heart rate Sulfa Drugs - Skin Rash Vioxx - Swelling    MEDICATIONS: Doxycycline Hyclate 100 mg capsule PRN  Aspirin Low Dose 81 MG TABS Oral  Benadryl  Caltrate 600 + D  CoQ10 CAPS Oral  Crestor 5 MG Oral Tablet Oral  Finacea 15 % External Gel PRN  Fish Oil  Losartan-Hydrochlorothiazide 100 mg-12.5 mg tablet  Metoprolol Tartrate 25 mg tablet Oral  Multi-Day Vitamins TABS Oral  Pantoprazole Sodium 40 mg tablet, delayed release  Sildenafil Citrate 20 MG Oral Tablet Take 2 to 5 tablets daily as needed for intercourse  TraMADol HCl - 50 MG Oral Tablet PRN  Tylenol TABS PRN  Vitamin B12     GU PSH: Epididymectomy - 2009 Prostate Needle Biopsy - 08/22/2018, 01/14/2018       PSH Notes: Dermatological Surgery, Previous Coronary Artery Bypass, Surgery Epididymectomy Right, Tonsillectomy, Knee Surgery   NON-GU PSH: Cholecystectomy (open) Remove Tonsils - 2008 Surgical Pathology, Gross And Microscopic Examination For Prostate Needle - 08/22/2018, 01/14/2018     GU PMH: Prostate Cancer -  01/28/2018 Microscopic hematuria - 10/10/2016 ED due to arterial insufficiency, Erectile dysfunction due to arterial insufficiency - 2016 Elevated PSA, Elevated prostate specific antigen (PSA) - 2016 Epididymitis, Epididymitis - 2016 Primary hypogonadism, Testosterone deficiency - 2016 Disorder of male genital organs, unspecified, Scrotum  Disorders - 2014 Other microscopic hematuria, Microscopic Hematuria - 2014 Spermatocele of epididymis, Unspec, Spermatocele - 2014      PMH Notes:   1) Prostate cancer: He had a history of an elevated PSA and underwent a prostate biopsy in January demonstrating low risk prostate cancer. After discussion, he elected active surveillance management.   Initial diagnosis: January 2020  Clinical stage: T1c Nx Mx  PSA at diagnosis: 4.55  Gleason score: 3+3=6 (Grade group 1)  Baseline IPSS: 2  Baseline SHIM score: 20   Surveillance:    2) Testosterone deficiency: He does have a history of documented testosterone deficiency with a baseline level below 200 ng/dL and significant fatigue and energy loss related to his testosterone deficiency. He has failed transdermal medications and most recently has been receiving replacement therapy with 300 mg intramuscularly monthly. He stopped therapy in August 2014 due to his rising PSA but since restarted therapy.   Current treatment: IM testosterone 200 mg q 2 weeks   3) Erectile dysfunction: He does have moderate erectile dysfunction. SHIM score was 10 at baseline. He has been treated with Viagra in the past which does give him a headache but has been moderately effective.   4) Epididymectomy: His other prior urologic history includes a history of a prior epididymectomy for a granuloma in the past.   ** His only major medical comorbidity is a history of coronary artery disease s/p CABG.     NON-GU PMH: Gastric ulcer, unspecified as acute or chronic, without hemorrhage or perforation, Gastric Ulcer - 2014 Coronary Artery Disease GERD Hypercholesterolemia    FAMILY HISTORY: Death In The Family Father - Runs In Family Family Health Status Number - Runs In Family Hypertension - Father Renal Cell Carcinoma - Father Stroke Syndrome - Grandfather   SOCIAL HISTORY: Marital Status: Married Preferred Language: English; Ethnicity: Not Hispanic Or  Latino; Race: White Current Smoking Status: Patient has never smoked.  Uses smokeless tobacco. Has never drank.  Does not drink caffeine.     Notes: previously used chewing tobacco, Occupation:, Caffeine Use, Marital History - Currently Married, Alcohol Use   REVIEW OF SYSTEMS:    GU Review Male:   Patient reports get up at night to urinate, hard to postpone urination, and stream starts and stops. Patient denies frequent urination, trouble starting your streams, burning/ pain with urination, leakage of urine, and have to strain to urinate .  Gastrointestinal (Lower):   Patient denies diarrhea and constipation.  Gastrointestinal (Upper):   Patient denies nausea and vomiting.  Constitutional:   Patient denies fever, night sweats, weight loss, and fatigue.  Skin:   Patient denies skin rash/ lesion and itching.  Eyes:   Patient denies blurred vision and double vision.  Ears/ Nose/ Throat:   Patient denies sore throat and sinus problems.  Hematologic/Lymphatic:   Patient denies swollen glands and easy bruising.  Cardiovascular:   Patient denies leg swelling and chest pains.  Respiratory:   Patient denies cough and shortness of breath.  Endocrine:   Patient denies excessive thirst.  Musculoskeletal:   Patient denies back pain and joint pain.  Neurological:   Patient reports headaches. Patient denies dizziness.  Psychologic:   Patient denies depression and anxiety.   VITAL  SIGNS:      09/23/2018 09:26 AM  Weight 175 lb / 79.38 kg  Height 70 in / 177.8 cm  BP 162/82 mmHg  Pulse 71 /min  Temperature 97.1 F / 36.1 C  BMI 25.1 kg/m   MULTI-SYSTEM PHYSICAL EXAMINATION:    Constitutional: Well-nourished. No physical deformities. Normally developed. Good grooming.  Respiratory: No labored breathing, no use of accessory muscles. Clear bilaterally.  Cardiovascular: Sternotomy scar. Normal temperature, normal extremity pulses, no swelling, no varicosities. Regular rate and rhythm.   Gastrointestinal: No mass, no tenderness, no rigidity, non obese abdomen. He has small laparoscopic incisions from his prior cholecystectomy.     PAST DATA REVIEWED:  Source Of History:  Patient  Lab Test Review:   PSA  Records Review:   Pathology Reports  Urine Test Review:   Urinalysis  X-Ray Review: Outside X-Ray: Reviewed Films.  MRI Prostate GSORAD: Reviewed Films.  Bone Scan: Reviewed Films.     08/04/18 11/15/17 04/05/17 10/04/16 10/21/15 04/21/15 10/20/14 09/16/13  PSA  Total PSA 2.84 ng/mL 4.55 ng/mL 3.49 ng/mL 3.14 ng/mL 2.29  2.59  2.64  1.88   Free PSA  0.57 ng/mL        % Free PSA  13 % PSA          08/04/18 11/15/17 04/05/17 10/04/16 05/25/16 05/04/16 04/21/15 10/21/14  Hormones  Testosterone, Total 178.7 ng/dL 578.3 ng/dL 510.5 ng/dL 646.3 ng/dL 627.9 pg/dL 148.5 pg/dL 296  360     PROCEDURES:          Urinalysis w/Scope Dipstick Dipstick Cont'd Micro  Color: Yellow Bilirubin: Neg mg/dL WBC/hpf: NS (Not Seen)  Appearance: Clear Ketones: Neg mg/dL RBC/hpf: 0 - 2/hpf  Specific Gravity: 1.010 Blood: 1+ ery/uL Bacteria: Rare (0-9/hpf)  pH: <=5.0 Protein: Neg mg/dL Cystals: NS (Not Seen)  Glucose: Neg mg/dL Urobilinogen: 0.2 mg/dL Casts: NS (Not Seen)    Nitrites: Neg Trichomonas: Not Present    Leukocyte Esterase: Neg leu/uL Mucous: Not Present      Epithelial Cells: NS (Not Seen)      Yeast: NS (Not Seen)      Sperm: Not Present    ASSESSMENT:      ICD-10 Details  1 GU:   Prostate Cancer - C61    PLAN:           Schedule Return Visit/Planned Activity: Keep Scheduled Appointment          Document Letter(s):  Created for Patient: Clinical Summary         Notes:   1. High risk prostate cancer: We have reviewed his staging studies that do not strongly suggest metastatic disease. He has been thoroughly counseled by Dr. Tammi Klippel and myself. Ultimately, he has made the decision to proceed with surgical therapy. The patient was counseled about the natural  history of prostate cancer and the standard treatment options that are available for prostate cancer. It was explained to him how his age and life expectancy, clinical stage, Gleason score, and PSA affect his prognosis, the decision to proceed with additional staging studies, as well as how that information influences recommended treatment strategies. We discussed the roles for active surveillance, radiation therapy, surgical therapy, androgen deprivation, as well as ablative therapy options for the treatment of prostate cancer as appropriate to his individual cancer situation. We discussed the risks and benefits of these options with regard to their impact on cancer control and also in terms of potential adverse events, complications, and impact on quality of life  particularly related to urinary and sexual function. The patient was encouraged to ask questions throughout the discussion today and all questions were answered to his stated satisfaction. In addition, the patient was provided with and/or directed to appropriate resources and literature for further education about prostate cancer and treatment options. We discussed surgical therapy for prostate cancer including the different available surgical approaches. We discussed, in detail, the risks and expectations of surgery with regard to cancer control, urinary control, and erectile function as well as the expected postoperative recovery process. Additional risks of surgery including but not limited to bleeding, infection, hernia formation, nerve damage, lymphocele formation, bowel/rectal injury potentially necessitating colostomy, damage to the urinary tract resulting in urine leakage, urethral stricture, and the cardiopulmonary risks such as myocardial infarction, stroke, death, venothromboembolism, etc. were explained. The risk of open surgical conversion for robotic/laparoscopic prostatectomy was also discussed.   Following our discussion today, he has  confirmed his decision to proceed with surgical treatment of his high risk prostate cancer. I have received a note from his cardiologist, Dr. Ellyn Hack, that he is considered low risk for perioperative cardiac complications and can proceed with surgery without further cardiac testing. He will be scheduled for a bilateral nerve-sparing robot assisted laparoscopic radical prostatectomy and pelvic lymphadenectomy. We have briefly discussed testosterone replacement therapy, which he has previously been on in the past, in the setting of post radical prostatectomy treatment for prostate cancer. He has confirm that his energy level is not particularly affected by his low testosterone levels. However, his sexual interest is. We will therefore discussed these issues in the future hopefully once he has been confirmed to be disease free.   Cc: Dr. Deland Pretty  Dr. Tyler Pita         Next Appointment:      Next Appointment: 09/30/2018 09:00 AM    Appointment Type: 60 Physical Therapy    Location: [facility] [phone]    Provider: Daivd Council    Reason for Visit: pre op PT      E & M CODE: I spent at least 46 minutes face to face with the patient, more than 50% of that time was spent on counseling and/or coordinating care.     * Signed by Raynelle Bring, M.D. on 09/23/18 at 9:57 PM (EDT)*

## 2018-10-13 ENCOUNTER — Ambulatory Visit (HOSPITAL_COMMUNITY): Payer: Medicare Other | Admitting: Certified Registered"

## 2018-10-13 ENCOUNTER — Encounter (HOSPITAL_COMMUNITY)
Admission: RE | Disposition: A | Discharge status: Home / Self Care | Payer: Self-pay | Source: Home / Self Care | Attending: Urology

## 2018-10-13 ENCOUNTER — Other Ambulatory Visit: Payer: Self-pay

## 2018-10-13 ENCOUNTER — Ambulatory Visit (HOSPITAL_COMMUNITY): Payer: Medicare Other | Admitting: Physician Assistant

## 2018-10-13 ENCOUNTER — Observation Stay (HOSPITAL_COMMUNITY)
Admission: RE | Admit: 2018-10-13 | Discharge: 2018-10-14 | Disposition: A | Discharge status: Home / Self Care | Payer: Medicare Other | Attending: Urology | Admitting: Urology

## 2018-10-13 ENCOUNTER — Encounter (HOSPITAL_COMMUNITY): Payer: Self-pay

## 2018-10-13 DIAGNOSIS — K219 Gastro-esophageal reflux disease without esophagitis: Secondary | ICD-10-CM | POA: Insufficient documentation

## 2018-10-13 DIAGNOSIS — Z79899 Other long term (current) drug therapy: Secondary | ICD-10-CM | POA: Insufficient documentation

## 2018-10-13 DIAGNOSIS — Z951 Presence of aortocoronary bypass graft: Secondary | ICD-10-CM | POA: Diagnosis not present

## 2018-10-13 DIAGNOSIS — Z8042 Family history of malignant neoplasm of prostate: Secondary | ICD-10-CM | POA: Diagnosis not present

## 2018-10-13 DIAGNOSIS — N5201 Erectile dysfunction due to arterial insufficiency: Secondary | ICD-10-CM | POA: Insufficient documentation

## 2018-10-13 DIAGNOSIS — E785 Hyperlipidemia, unspecified: Secondary | ICD-10-CM | POA: Diagnosis not present

## 2018-10-13 DIAGNOSIS — Z7982 Long term (current) use of aspirin: Secondary | ICD-10-CM | POA: Insufficient documentation

## 2018-10-13 DIAGNOSIS — E291 Testicular hypofunction: Secondary | ICD-10-CM | POA: Diagnosis not present

## 2018-10-13 DIAGNOSIS — C61 Malignant neoplasm of prostate: Secondary | ICD-10-CM | POA: Diagnosis not present

## 2018-10-13 DIAGNOSIS — Z87891 Personal history of nicotine dependence: Secondary | ICD-10-CM | POA: Insufficient documentation

## 2018-10-13 DIAGNOSIS — E7800 Pure hypercholesterolemia, unspecified: Secondary | ICD-10-CM | POA: Diagnosis not present

## 2018-10-13 DIAGNOSIS — I2510 Atherosclerotic heart disease of native coronary artery without angina pectoris: Secondary | ICD-10-CM | POA: Insufficient documentation

## 2018-10-13 DIAGNOSIS — I10 Essential (primary) hypertension: Secondary | ICD-10-CM | POA: Diagnosis not present

## 2018-10-13 HISTORY — PX: LYMPHADENECTOMY: SHX5960

## 2018-10-13 HISTORY — PX: ROBOT ASSISTED LAPAROSCOPIC RADICAL PROSTATECTOMY: SHX5141

## 2018-10-13 LAB — HEMOGLOBIN AND HEMATOCRIT, BLOOD
HCT: 42 % (ref 39.0–52.0)
Hemoglobin: 14.4 g/dL (ref 13.0–17.0)

## 2018-10-13 LAB — TYPE AND SCREEN
ABO/RH(D): O POS
Antibody Screen: NEGATIVE

## 2018-10-13 SURGERY — XI ROBOTIC ASSISTED LAPAROSCOPIC RADICAL PROSTATECTOMY LEVEL 2
Anesthesia: General

## 2018-10-13 MED ORDER — FENTANYL CITRATE (PF) 100 MCG/2ML IJ SOLN
INTRAMUSCULAR | Status: AC
  Filled 2018-10-13: qty 2

## 2018-10-13 MED ORDER — BACITRACIN-NEOMYCIN-POLYMYXIN 400-5-5000 EX OINT: 1.0000 "application " | TOPICAL_OINTMENT | Freq: Three times a day (TID) | CUTANEOUS | Status: DC | PRN

## 2018-10-13 MED ORDER — BELLADONNA ALKALOIDS-OPIUM 16.2-60 MG RE SUPP: 1.0000 | Freq: Four times a day (QID) | RECTAL | Status: DC | PRN

## 2018-10-13 MED ORDER — HYDROCHLOROTHIAZIDE 12.5 MG PO CAPS
12.5000 mg | ORAL_CAPSULE | Freq: Every day | ORAL | Status: DC
  Filled 2018-10-13: qty 1

## 2018-10-13 MED ORDER — VANCOMYCIN HCL IN DEXTROSE 1-5 GM/200ML-% IV SOLN
1000.0000 mg | Freq: Two times a day (BID) | INTRAVENOUS | Status: AC
  Administered 2018-10-13: 1000 mg via INTRAVENOUS
  Filled 2018-10-13: qty 200

## 2018-10-13 MED ORDER — SUGAMMADEX SODIUM 200 MG/2ML IV SOLN
INTRAVENOUS | Status: DC | PRN
  Administered 2018-10-13: 180 mg via INTRAVENOUS

## 2018-10-13 MED ORDER — ONDANSETRON HCL 4 MG/2ML IJ SOLN
INTRAMUSCULAR | Status: DC | PRN
  Administered 2018-10-13: 4 mg via INTRAVENOUS

## 2018-10-13 MED ORDER — CIPROFLOXACIN HCL 500 MG PO TABS: 500.0000 mg | ORAL_TABLET | Freq: Two times a day (BID) | ORAL | 0 refills | Status: DC

## 2018-10-13 MED ORDER — TRAMADOL HCL 50 MG PO TABS: 50.0000 mg | ORAL_TABLET | Freq: Four times a day (QID) | ORAL | 0 refills | Status: DC | PRN

## 2018-10-13 MED ORDER — BUPIVACAINE HCL (PF) 0.25 % IJ SOLN
INTRAMUSCULAR | Status: AC
  Filled 2018-10-13: qty 30

## 2018-10-13 MED ORDER — PANTOPRAZOLE SODIUM 40 MG PO TBEC
40.0000 mg | DELAYED_RELEASE_TABLET | Freq: Every day | ORAL | Status: DC
  Administered 2018-10-14: 40 mg via ORAL
  Filled 2018-10-13: qty 1

## 2018-10-13 MED ORDER — DOCUSATE SODIUM 100 MG PO CAPS
100.0000 mg | ORAL_CAPSULE | Freq: Two times a day (BID) | ORAL | Status: DC
  Administered 2018-10-13 – 2018-10-14 (×2): 100 mg via ORAL
  Filled 2018-10-13 (×2): qty 1

## 2018-10-13 MED ORDER — LACTATED RINGERS IV SOLN
INTRAVENOUS | Status: DC
  Administered 2018-10-13 (×2): via INTRAVENOUS

## 2018-10-13 MED ORDER — METOPROLOL TARTRATE 25 MG PO TABS
25.0000 mg | ORAL_TABLET | Freq: Every day | ORAL | Status: DC
  Administered 2018-10-14: 25 mg via ORAL
  Filled 2018-10-13: qty 1

## 2018-10-13 MED ORDER — KCL IN DEXTROSE-NACL 20-5-0.45 MEQ/L-%-% IV SOLN
INTRAVENOUS | Status: DC
  Administered 2018-10-13: 18:00:00 via INTRAVENOUS
  Filled 2018-10-13 (×2): qty 1000

## 2018-10-13 MED ORDER — ONDANSETRON HCL 4 MG/2ML IJ SOLN
INTRAMUSCULAR | Status: AC
  Filled 2018-10-13: qty 2

## 2018-10-13 MED ORDER — METOPROLOL TARTRATE 25 MG PO TABS
12.5000 mg | ORAL_TABLET | Freq: Every day | ORAL | Status: DC
  Administered 2018-10-13: 12.5 mg via ORAL
  Filled 2018-10-13: qty 1

## 2018-10-13 MED ORDER — LORATADINE 10 MG PO TABS
10.0000 mg | ORAL_TABLET | Freq: Every day | ORAL | Status: DC
  Administered 2018-10-14: 10 mg via ORAL
  Filled 2018-10-13: qty 1

## 2018-10-13 MED ORDER — MORPHINE SULFATE (PF) 2 MG/ML IV SOLN: 2.0000 mg | INTRAVENOUS | Status: DC | PRN

## 2018-10-13 MED ORDER — ROSUVASTATIN CALCIUM 5 MG PO TABS
5.0000 mg | ORAL_TABLET | Freq: Every day | ORAL | Status: DC
  Filled 2018-10-13 (×2): qty 1

## 2018-10-13 MED ORDER — LIDOCAINE 2% (20 MG/ML) 5 ML SYRINGE
INTRAMUSCULAR | Status: DC | PRN
  Administered 2018-10-13: 60 mg via INTRAVENOUS

## 2018-10-13 MED ORDER — LACTATED RINGERS IV SOLN
INTRAVENOUS | Status: DC | PRN
  Administered 2018-10-13: 1000 mL

## 2018-10-13 MED ORDER — HEPARIN SODIUM (PORCINE) 1000 UNIT/ML IJ SOLN
INTRAMUSCULAR | Status: AC
  Filled 2018-10-13: qty 1

## 2018-10-13 MED ORDER — ONDANSETRON HCL 4 MG/2ML IJ SOLN: 4.0000 mg | INTRAMUSCULAR | Status: DC | PRN

## 2018-10-13 MED ORDER — EPHEDRINE SULFATE-NACL 50-0.9 MG/10ML-% IV SOSY
PREFILLED_SYRINGE | INTRAVENOUS | Status: DC | PRN
  Administered 2018-10-13 (×2): 10 mg via INTRAVENOUS

## 2018-10-13 MED ORDER — ROCURONIUM BROMIDE 10 MG/ML (PF) SYRINGE
PREFILLED_SYRINGE | INTRAVENOUS | Status: AC
  Filled 2018-10-13: qty 10

## 2018-10-13 MED ORDER — LOSARTAN POTASSIUM 50 MG PO TABS: 100.0000 mg | ORAL_TABLET | Freq: Every day | ORAL | Status: DC

## 2018-10-13 MED ORDER — ROCURONIUM BROMIDE 10 MG/ML (PF) SYRINGE
PREFILLED_SYRINGE | INTRAVENOUS | Status: DC | PRN
  Administered 2018-10-13: 10 mg via INTRAVENOUS
  Administered 2018-10-13: 20 mg via INTRAVENOUS
  Administered 2018-10-13: 50 mg via INTRAVENOUS

## 2018-10-13 MED ORDER — LOSARTAN POTASSIUM-HCTZ 100-12.5 MG PO TABS: 1.0000 | ORAL_TABLET | Freq: Every day | ORAL | Status: DC

## 2018-10-13 MED ORDER — DIPHENHYDRAMINE HCL 12.5 MG/5ML PO ELIX: 12.5000 mg | ORAL_SOLUTION | Freq: Four times a day (QID) | ORAL | Status: DC | PRN

## 2018-10-13 MED ORDER — PROPOFOL 10 MG/ML IV BOLUS
INTRAVENOUS | Status: DC | PRN
  Administered 2018-10-13: 120 mg via INTRAVENOUS

## 2018-10-13 MED ORDER — DIPHENHYDRAMINE HCL 50 MG/ML IJ SOLN: 12.5000 mg | Freq: Four times a day (QID) | INTRAMUSCULAR | Status: DC | PRN

## 2018-10-13 MED ORDER — ZOLPIDEM TARTRATE 5 MG PO TABS: 5.0000 mg | ORAL_TABLET | Freq: Every evening | ORAL | Status: DC | PRN

## 2018-10-13 MED ORDER — BUPIVACAINE-EPINEPHRINE 0.25% -1:200000 IJ SOLN
INTRAMUSCULAR | Status: DC | PRN
  Administered 2018-10-13: 30 mL

## 2018-10-13 MED ORDER — EPHEDRINE 5 MG/ML INJ
INTRAVENOUS | Status: AC
  Filled 2018-10-13: qty 10

## 2018-10-13 MED ORDER — LIDOCAINE 2% (20 MG/ML) 5 ML SYRINGE
INTRAMUSCULAR | Status: AC
  Filled 2018-10-13: qty 5

## 2018-10-13 MED ORDER — FENTANYL CITRATE (PF) 100 MCG/2ML IJ SOLN
25.0000 ug | INTRAMUSCULAR | Status: DC | PRN
  Administered 2018-10-13 (×2): 50 ug via INTRAVENOUS

## 2018-10-13 MED ORDER — MIDAZOLAM HCL 2 MG/2ML IJ SOLN
INTRAMUSCULAR | Status: AC
  Filled 2018-10-13: qty 2

## 2018-10-13 MED ORDER — ONDANSETRON HCL 4 MG/2ML IJ SOLN: 4.0000 mg | Freq: Once | INTRAMUSCULAR | Status: DC | PRN

## 2018-10-13 MED ORDER — SODIUM CHLORIDE 0.9 % IR SOLN
Status: DC | PRN
  Administered 2018-10-13: 1000 mL

## 2018-10-13 MED ORDER — FENTANYL CITRATE (PF) 250 MCG/5ML IJ SOLN
INTRAMUSCULAR | Status: AC
  Filled 2018-10-13: qty 5

## 2018-10-13 MED ORDER — MIDAZOLAM HCL 2 MG/2ML IJ SOLN
INTRAMUSCULAR | Status: DC | PRN
  Administered 2018-10-13: 2 mg via INTRAVENOUS

## 2018-10-13 MED ORDER — ACETAMINOPHEN 10 MG/ML IV SOLN
1000.0000 mg | Freq: Four times a day (QID) | INTRAVENOUS | Status: DC
  Administered 2018-10-13 – 2018-10-14 (×3): 1000 mg via INTRAVENOUS
  Filled 2018-10-13 (×4): qty 100

## 2018-10-13 MED ORDER — PROPOFOL 10 MG/ML IV BOLUS
INTRAVENOUS | Status: AC
  Filled 2018-10-13: qty 20

## 2018-10-13 MED ORDER — VANCOMYCIN HCL IN DEXTROSE 1-5 GM/200ML-% IV SOLN
1000.0000 mg | INTRAVENOUS | Status: AC
  Administered 2018-10-13: 1000 mg via INTRAVENOUS
  Filled 2018-10-13: qty 200

## 2018-10-13 MED ORDER — DEXAMETHASONE SODIUM PHOSPHATE 10 MG/ML IJ SOLN
INTRAMUSCULAR | Status: DC | PRN
  Administered 2018-10-13: 8 mg via INTRAVENOUS

## 2018-10-13 MED ORDER — SODIUM CHLORIDE 0.9 % IV BOLUS
1000.0000 mL | Freq: Once | INTRAVENOUS | Status: AC
  Administered 2018-10-13: 1000 mL via INTRAVENOUS

## 2018-10-13 MED ORDER — DEXAMETHASONE SODIUM PHOSPHATE 10 MG/ML IJ SOLN
INTRAMUSCULAR | Status: AC
  Filled 2018-10-13: qty 1

## 2018-10-13 MED ORDER — FENTANYL CITRATE (PF) 100 MCG/2ML IJ SOLN
INTRAMUSCULAR | Status: DC | PRN
  Administered 2018-10-13: 100 ug via INTRAVENOUS
  Administered 2018-10-13 (×2): 50 ug via INTRAVENOUS

## 2018-10-13 MED ORDER — PHENYLEPHRINE 40 MCG/ML (10ML) SYRINGE FOR IV PUSH (FOR BLOOD PRESSURE SUPPORT)
PREFILLED_SYRINGE | INTRAVENOUS | Status: AC
  Filled 2018-10-13: qty 10

## 2018-10-13 MED ORDER — ACETAMINOPHEN 500 MG PO TABS
1000.0000 mg | ORAL_TABLET | Freq: Once | ORAL | Status: AC
  Administered 2018-10-13: 1000 mg via ORAL
  Filled 2018-10-13: qty 2

## 2018-10-13 SURGICAL SUPPLY — 55 items
APPLICATOR COTTON TIP 6 STRL (MISCELLANEOUS) ×2 IMPLANT
APPLICATOR COTTON TIP 6IN STRL (MISCELLANEOUS) ×3
BOOT SUTURE VASCULAR YLW (MISCELLANEOUS) ×1
CATH SILICON 18FR 30CC (CATHETERS) ×2 IMPLANT
CATH SILICONE 16FRX5CC (CATHETERS) ×1 IMPLANT
CHLORAPREP W/TINT 26 (MISCELLANEOUS) ×3 IMPLANT
CLAMP SUTURE YELLOW 5 PAIRS (MISCELLANEOUS) IMPLANT
CLIP VESOLOCK LG 6/CT PURPLE (CLIP) ×6 IMPLANT
COVER SURGICAL LIGHT HANDLE (MISCELLANEOUS) ×3 IMPLANT
COVER TIP SHEARS 8 DVNC (MISCELLANEOUS) ×2 IMPLANT
COVER TIP SHEARS 8MM DA VINCI (MISCELLANEOUS) ×1
COVER WAND RF STERILE (DRAPES) IMPLANT
CUTTER ECHEON FLEX ENDO 45 340 (ENDOMECHANICALS) ×3 IMPLANT
DECANTER SPIKE VIAL GLASS SM (MISCELLANEOUS) ×3 IMPLANT
DERMABOND ADVANCED (GAUZE/BANDAGES/DRESSINGS) ×1
DERMABOND ADVANCED .7 DNX12 (GAUZE/BANDAGES/DRESSINGS) ×2 IMPLANT
DRAIN CHANNEL RND F F (WOUND CARE) ×1 IMPLANT
DRAPE ARM DVNC X/XI (DISPOSABLE) ×8 IMPLANT
DRAPE COLUMN DVNC XI (DISPOSABLE) ×2 IMPLANT
DRAPE DA VINCI XI ARM (DISPOSABLE) ×4
DRAPE DA VINCI XI COLUMN (DISPOSABLE) ×1
DRAPE SURG IRRIG POUCH 19X23 (DRAPES) ×3 IMPLANT
DRSG TEGADERM 4X4.75 (GAUZE/BANDAGES/DRESSINGS) ×3 IMPLANT
ELECT PENCIL ROCKER SW 15FT (MISCELLANEOUS) ×3 IMPLANT
ELECT REM PT RETURN 15FT ADLT (MISCELLANEOUS) ×3 IMPLANT
GOWN STRL REUS W/TWL LRG LVL3 (GOWN DISPOSABLE) ×9 IMPLANT
HOLDER FOLEY CATH W/STRAP (MISCELLANEOUS) ×3 IMPLANT
IRRIG SUCT STRYKERFLOW 2 WTIP (MISCELLANEOUS) ×3
IRRIGATION SUCT STRKRFLW 2 WTP (MISCELLANEOUS) ×2 IMPLANT
KIT TURNOVER KIT A (KITS) IMPLANT
NDL SAFETY ECLIPSE 18X1.5 (NEEDLE) ×2 IMPLANT
NEEDLE HYPO 18GX1.5 SHARP (NEEDLE) ×1
PACK ROBOT UROLOGY CUSTOM (CUSTOM PROCEDURE TRAY) ×3 IMPLANT
RELOAD STAPLE 45 4.1 GRN THCK (STAPLE) ×2 IMPLANT
SEAL CANN UNIV 5-8 DVNC XI (MISCELLANEOUS) ×8 IMPLANT
SEAL XI 5MM-8MM UNIVERSAL (MISCELLANEOUS) ×4
SET TUBE SMOKE EVAC HIGH FLOW (TUBING) ×3 IMPLANT
SOLUTION ELECTROLUBE (MISCELLANEOUS) ×3 IMPLANT
STAPLE RELOAD 45 GRN (STAPLE) ×2 IMPLANT
STAPLE RELOAD 45MM GREEN (STAPLE) ×1
SUT ETHILON 3 0 PS 1 (SUTURE) ×3 IMPLANT
SUT MNCRL 3 0 RB1 (SUTURE) ×2 IMPLANT
SUT MNCRL 3 0 VIOLET RB1 (SUTURE) ×2 IMPLANT
SUT MNCRL AB 4-0 PS2 18 (SUTURE) ×6 IMPLANT
SUT MONOCRYL 3 0 RB1 (SUTURE) ×2
SUT VIC AB 0 CT1 27 (SUTURE) ×1
SUT VIC AB 0 CT1 27XBRD ANTBC (SUTURE) ×2 IMPLANT
SUT VIC AB 0 UR5 27 (SUTURE) ×3 IMPLANT
SUT VIC AB 2-0 SH 27 (SUTURE) ×1
SUT VIC AB 2-0 SH 27X BRD (SUTURE) ×2 IMPLANT
SUT VICRYL 0 UR6 27IN ABS (SUTURE) ×6 IMPLANT
SYR 27GX1/2 1ML LL SAFETY (SYRINGE) ×3 IMPLANT
TAG SUTURE CLAMP YLW 5PR (MISCELLANEOUS) ×2
TOWEL OR NON WOVEN STRL DISP B (DISPOSABLE) ×3 IMPLANT
WATER STERILE IRR 1000ML POUR (IV SOLUTION) ×3 IMPLANT

## 2018-10-13 NOTE — Anesthesia Postprocedure Evaluation (Signed)
Anesthesia Post Note  Patient: [name]  Procedure(s) Performed: XI ROBOTIC ASSISTED LAPAROSCOPIC RADICAL PROSTATECTOMY LEVEL 2 (N/A ) LYMPHADENECTOMY, PELVIC (Bilateral )     Patient location during evaluation: PACU Anesthesia Type: General Level of consciousness: awake and alert Pain management: pain level controlled Vital Signs Assessment: post-procedure vital signs reviewed and stable Respiratory status: spontaneous breathing, nonlabored ventilation, respiratory function stable and patient connected to nasal cannula oxygen Cardiovascular status: blood pressure returned to baseline and stable Postop Assessment: no apparent nausea or vomiting Anesthetic complications: no    Last Vitals:  Vitals:   10/13/18 1515 10/13/18 1530  BP: 114/66   Pulse: 62 (!) 58  Resp: 13 15  Temp: (!) 36.4 C   SpO2: 99% 99%    Last Pain:  Vitals:   10/13/18 1530  TempSrc:   PainSc: [place]

## 2018-10-13 NOTE — Discharge Instructions (Signed)

## 2018-10-13 NOTE — Transfer of Care (Signed)
Immediate Anesthesia Transfer of Care Note  Patient: [name]  Procedure(s) Performed: XI ROBOTIC ASSISTED LAPAROSCOPIC RADICAL PROSTATECTOMY LEVEL 2 (N/A ) LYMPHADENECTOMY, PELVIC (Bilateral )  Patient Location: PACU  Anesthesia Type:General  Level of Consciousness: awake, alert  and oriented  Airway & Oxygen Therapy: Patient Spontanous Breathing and Patient connected to face mask oxygen  Post-op Assessment: Report given to RN, Post -op Vital signs reviewed and stable and Patient moving all extremities X 4  Post vital signs: Reviewed and stable  Last Vitals:  Vitals Value Taken Time  BP 114/66 10/13/18 1515  Temp    Pulse 65 10/13/18 1516  Resp 22 10/13/18 1516  SpO2 99 % 10/13/18 1516  Vitals shown include unvalidated device data.  Last Pain:  Vitals:   10/13/18 1054  TempSrc:   PainSc: 0-No pain         Complications: No apparent anesthesia complications

## 2018-10-13 NOTE — Anesthesia Procedure Notes (Signed)
Procedure Name: Intubation Date/Time: 10/13/2018 12:35 PM Performed by: Niel Hummer, CRNA Pre-anesthesia Checklist: Patient identified, Emergency Drugs available, Suction available and Patient being monitored Patient Re-evaluated:Patient Re-evaluated prior to induction Oxygen Delivery Method: Circle system utilized Preoxygenation: Pre-oxygenation with 100% oxygen Induction Type: IV induction Ventilation: Mask ventilation without difficulty Laryngoscope Size: Mac and 4 Grade View: Grade I Tube type: Oral Tube size: 7.5 mm Number of attempts: 1 Airway Equipment and Method: Stylet Placement Confirmation: ETT inserted through vocal cords under direct vision,  positive ETCO2 and breath sounds checked- equal and bilateral Secured at: 23 cm Tube secured with: Tape Dental Injury: Teeth and Oropharynx as per pre-operative assessment

## 2018-10-13 NOTE — Op Note (Signed)
Preoperative diagnosis: Clinically localized adenocarcinoma of the prostate (clinical stage T1c N0 Mx)  Postoperative diagnosis: Clinically localized adenocarcinoma of the prostate (clinical stage T1c N0 Mx)  Procedure:  1. Robotic assisted laparoscopic radical prostatectomy (bilateral nerve sparing) 2. Bilateral robotic assisted laparoscopic pelvic lymphadenectomy  Surgeon: Pryor Curia. M.D.  Assistant: Debbrah Alar, PA-C  An assistant was required for this surgical procedure.  The duties of the assistant included but were not limited to suctioning, passing suture, camera manipulation, retraction. This procedure would not be able to be performed without an Environmental consultant.  Anesthesia: General  Complications: None  EBL: 300 mL  IVF:  1100 mL crystalloid  Specimens: 1. Prostate and seminal vesicles 2. Right pelvic lymph nodes 3. Left pelvic lymph nodes  Disposition of specimens: Pathology  Drains: 1. 20 Fr coude catheter 2. # 19 Blake pelvic drain  Indication: [name] is a 73 y.o. year old patient with clinically localized prostate cancer.  After a thorough review of the management options for treatment of prostate cancer, he elected to proceed with surgical therapy and the above procedure(s).  We have discussed the potential benefits and risks of the procedure, side effects of the proposed treatment, the likelihood of the patient achieving the goals of the procedure, and any potential problems that might occur during the procedure or recuperation. Informed consent has been obtained.  Description of procedure:  The patient was taken to the operating room and a general anesthetic was administered. He was given preoperative antibiotics, placed in the dorsal lithotomy position, and prepped and draped in the usual sterile fashion. Next a preoperative timeout was performed. A urethral catheter was placed into the bladder and a site was selected near the umbilicus for  placement of the camera port. This was placed using a standard open Hassan technique which allowed entry into the peritoneal cavity under direct vision and without difficulty. An 8 mm robotic port was placed and a pneumoperitoneum established. The camera was then used to inspect the abdomen and there was no evidence of any intra-abdominal injuries or other abnormalities. The remaining abdominal ports were then placed. 8 mm robotic ports were placed in the right lower quadrant, left lower quadrant, and far left lateral abdominal wall. A 5 mm port was placed in the right upper quadrant and a 12 mm port was placed in the right lateral abdominal wall for laparoscopic assistance. All ports were placed under direct vision without difficulty. The surgical cart was then docked.   Utilizing the cautery scissors, the bladder was reflected posteriorly allowing entry into the space of Retzius and identification of the endopelvic fascia and prostate. The periprostatic fat was then removed from the prostate allowing full exposure of the endopelvic fascia. The endopelvic fascia was then incised from the apex back to the base of the prostate bilaterally and the underlying levator muscle fibers were swept laterally off the prostate thereby isolating the dorsal venous complex. The dorsal vein was then stapled and divided with a 45 mm Flex Echelon stapler. Attention then turned to the bladder neck which was divided anteriorly thereby allowing entry into the bladder and exposure of the urethral catheter. The catheter balloon was deflated and the catheter was brought into the operative field and used to retract the prostate anteriorly. The posterior bladder neck was then examined and was divided allowing further dissection between the bladder and prostate posteriorly until the vasa deferentia and seminal vessels were identified. The vasa deferentia were isolated, divided, and lifted anteriorly.  The seminal vesicles were dissected down  to their tips with care to control the seminal vascular arterial blood supply. These structures were then lifted anteriorly and the space between Denonvillier's fascia and the anterior rectum was developed with a combination of sharp and blunt dissection. This isolated the vascular pedicles of the prostate.  The lateral prostatic fascia was then sharply incised allowing release of the neurovascular bundles bilaterally. The vascular pedicles of the prostate were then ligated with Weck clips between the prostate and neurovascular bundles and divided with sharp cold scissor dissection resulting in neurovascular bundle preservation. The neurovascular bundles were then separated off the apex of the prostate and urethra bilaterally.  The urethra was then sharply transected allowing the prostate specimen to be disarticulated. The pelvis was copiously irrigated and hemostasis was ensured. There was no evidence for rectal injury.  Attention then turned to the right pelvic sidewall. The fibrofatty tissue between the external iliac vein, confluence of the iliac vessels, hypogastric artery, and Cooper's ligament was dissected free from the pelvic sidewall with care to preserve the obturator nerve. Weck clips were used for lymphostasis and hemostasis. An identical procedure was performed on the contralateral side and the lymphatic packets were removed for permanent pathologic analysis.  Attention then turned to the urethral anastomosis. A 2-0 Vicryl slip knot was placed between Denonvillier's fascia, the posterior bladder neck, and the posterior urethra to reapproximate these structures. A double-armed 3-0 Monocryl suture was then used to perform a 360 running tension-free anastomosis between the bladder neck and urethra. A new urethral catheter was then placed into the bladder and irrigated. There were no blood clots within the bladder and the anastomosis appeared to be watertight. A #19 Blake drain was then brought  through the left lateral 8 mm port site and positioned appropriately within the pelvis. It was secured to the skin with a nylon suture. The surgical cart was then undocked. The right lateral 12 mm port site was closed at the fascial level with a 0 Vicryl suture placed laparoscopically. All remaining ports were then removed under direct vision. The prostate specimen was removed intact within the Endopouch retrieval bag via the periumbilical camera port site. This fascial opening was closed with two running 0 Vicryl sutures. 0.25% Marcaine was then injected into all port sites and all incisions were reapproximated at the skin level with 4-0 Monocryl subcuticular sutures and Dermabond. The patient appeared to tolerate the procedure well and without complications. The patient was able to be extubated and transferred to the recovery unit in satisfactory condition.   Pryor Curia MD

## 2018-10-13 NOTE — Progress Notes (Signed)
Patient ID: [name], male   DOB: [birth year], 74 y.o.   MRN: [id number]  Post-op note  Subjective: The patient is doing well.  No complaints.  Objective: Vital signs in last 24 hours: Temp:  [97.5 F (36.4 C)-98.1 F (36.7 C)] 97.5 F (36.4 C) (10/05 1515) Pulse Rate:  [57-70] 57 (10/05 1600) Resp:  [11-18] 17 (10/05 1600) BP: (114-171)/(58-92) 116/58 (10/05 1600) SpO2:  [97 %-100 %] 97 % (10/05 1600) Weight:  [81.7 kg] 81.7 kg (10/05 1025)  Intake/Output from previous day: No intake/output data recorded. Intake/Output this shift: Total I/O In: 1100 [I.V.:1100] Out: 30 [Blood:30]  Physical Exam:  General: Alert and oriented. Abdomen: Soft, Nondistended. Incisions: Clean and dry. GU: Urine clear.  Lab Results: Recent Labs    10/13/18 1531  HGB 14.4  HCT 42.0    Assessment/Plan: POD#0   1) Continue to monitor, ambulate, IS   Pryor Curia. MD   LOS: 0 days   Dutch Gray 10/13/2018, 4:28 PM

## 2018-10-13 NOTE — Interval H&P Note (Signed)
History and Physical Interval Note:  10/13/2018 12:15 PM  [place]  has presented today for surgery, with the diagnosis of PROSTATE CANCER.  The various methods of treatment have been discussed with the patient and family. After consideration of risks, benefits and other options for treatment, the patient has consented to  Procedure(s): XI ROBOTIC ASSISTED LAPAROSCOPIC RADICAL PROSTATECTOMY LEVEL 2 (N/A) LYMPHADENECTOMY, PELVIC (Bilateral) as a surgical intervention.  The patient's history has been reviewed, patient examined, no change in status, stable for surgery.  I have reviewed the patient's chart and labs.  Questions were answered to the patient's satisfaction.     [employer]

## 2018-10-14 ENCOUNTER — Encounter (HOSPITAL_COMMUNITY): Payer: Self-pay | Admitting: Urology

## 2018-10-14 DIAGNOSIS — K219 Gastro-esophageal reflux disease without esophagitis: Secondary | ICD-10-CM | POA: Diagnosis not present

## 2018-10-14 DIAGNOSIS — E7800 Pure hypercholesterolemia, unspecified: Secondary | ICD-10-CM | POA: Diagnosis not present

## 2018-10-14 DIAGNOSIS — C61 Malignant neoplasm of prostate: Secondary | ICD-10-CM | POA: Diagnosis not present

## 2018-10-14 DIAGNOSIS — Z8042 Family history of malignant neoplasm of prostate: Secondary | ICD-10-CM | POA: Diagnosis not present

## 2018-10-14 DIAGNOSIS — N5201 Erectile dysfunction due to arterial insufficiency: Secondary | ICD-10-CM | POA: Diagnosis not present

## 2018-10-14 DIAGNOSIS — I2510 Atherosclerotic heart disease of native coronary artery without angina pectoris: Secondary | ICD-10-CM | POA: Diagnosis not present

## 2018-10-14 LAB — HEMOGLOBIN AND HEMATOCRIT, BLOOD
HCT: 39.5 % (ref 39.0–52.0)
Hemoglobin: 13.4 g/dL (ref 13.0–17.0)

## 2018-10-14 MED ORDER — BISACODYL 10 MG RE SUPP
10.0000 mg | Freq: Once | RECTAL | Status: AC
  Administered 2018-10-14: 10 mg via RECTAL
  Filled 2018-10-14: qty 1

## 2018-10-14 MED ORDER — ACETAMINOPHEN 325 MG PO TABS
650.0000 mg | ORAL_TABLET | Freq: Four times a day (QID) | ORAL | Status: DC | PRN
  Administered 2018-10-14: 650 mg via ORAL
  Filled 2018-10-14: qty 2

## 2018-10-14 MED ORDER — CHLORHEXIDINE GLUCONATE CLOTH 2 % EX PADS: 6.0000 | MEDICATED_PAD | Freq: Every day | CUTANEOUS | Status: DC

## 2018-10-14 MED ORDER — CHLORHEXIDINE GLUCONATE CLOTH 2 % EX PADS
6.0000 | MEDICATED_PAD | Freq: Every day | CUTANEOUS | Status: DC
  Administered 2018-10-14: 6 via TOPICAL

## 2018-10-14 MED ORDER — TRAMADOL HCL 50 MG PO TABS: 50.0000 mg | ORAL_TABLET | Freq: Four times a day (QID) | ORAL | Status: DC | PRN

## 2018-10-14 NOTE — Progress Notes (Signed)
Patient ID: [name], male   DOB: [birth year], 73 y.o.   MRN: [id number]  1 Day Post-Op Subjective: The patient is doing well.  No nausea or vomiting. Pain is adequately controlled.  Objective: Vital signs in last 24 hours: Temp:  [97.4 F (36.3 C)-98.1 F (36.7 C)] 98 F (36.7 C) (10/06 0559) Pulse Rate:  [57-73] 71 (10/06 0559) Resp:  [11-18] 18 (10/06 0559) BP: (112-171)/(58-92) 128/66 (10/06 0559) SpO2:  [96 %-100 %] 98 % (10/06 0559) Weight:  [81.7 kg] 81.7 kg (10/05 1025)  Intake/Output from previous day: 10/05 0701 - 10/06 0700 In: 2906.2 [P.O.:360; I.V.:1146.2; IV Piggyback:1400] Out: 2325 [Urine:2175; Drains:105; Blood:45] Intake/Output this shift: No intake/output data recorded.  Physical Exam:  General: Alert and oriented. CV: RRR Lungs: Clear bilaterally. GI: Soft, Nondistended. Incisions: Clean, dry, and intact Urine: Clear Extremities: Nontender, no erythema, no edema.  Lab Results: Recent Labs    10/13/18 1531 10/14/18 0530  HGB 14.4 13.4  HCT 42.0 39.5      Assessment/Plan: POD# 1 s/p robotic prostatectomy.  1) SL IVF 2) Ambulate, Incentive spirometry 3) Transition to oral pain medication 4) Dulcolax suppository 5) D/C pelvic drain 6) Plan for likely discharge later today   Pryor Curia. MD   LOS: 0 days   Dutch Gray 10/14/2018, 7:46 AM

## 2018-10-14 NOTE — Discharge Summary (Signed)
Date of admission: 10/13/2018  Date of discharge: 10/14/2018  Admission diagnosis: Prostate Cancer  Discharge diagnosis: Prostate Cancer  History and Physical: For full details, please see admission history and physical. Briefly, [name] is a 73 y.o. gentleman with localized prostate cancer.  After discussing management/treatment options, he elected to proceed with surgical treatment.  Hospital Course: [name] was taken to the operating room on 10/13/2018 and underwent a robotic assisted laparoscopic radical prostatectomy. He tolerated this procedure well and without complications. Postoperatively, he was able to be transferred to a regular hospital room following recovery from anesthesia.  He was able to begin ambulating the night of surgery. He remained hemodynamically stable overnight.  He had excellent urine output with appropriately minimal output from his pelvic drain and his pelvic drain was removed on POD #1.  He was transitioned to oral pain medication, tolerated a clear liquid diet, and had met all discharge criteria and was able to be discharged home later on POD#1.  Laboratory values:  Recent Labs    10/13/18 1531 10/14/18 0530  HGB 14.4 13.4  HCT 42.0 39.5    Disposition: Home  Discharge instruction: He was instructed to be ambulatory but to refrain from heavy lifting, strenuous activity, or driving. He was instructed on urethral catheter care.  Discharge medications:   Allergies as of 10/14/2018      Reactions   Cephalosporins Palpitations   Triamterene-hctz Palpitations   Statins Other (See Comments)   " elevated muscle enzymes" Lipitor   Vioxx [rofecoxib] Swelling   Facial swelling   Lisinopril Cough   Sulfa Antibiotics Rash      Medication List    STOP taking these medications   aspirin EC 81 MG tablet   Calcium 600+D 600-400 MG-UNIT tablet Generic drug: Calcium Carbonate-Vitamin D   Co Q-10 100 MG Caps   Fish Oil Oil    loratadine-pseudoephedrine 5-120 MG tablet Commonly known as: CLARITIN-D 12-hour   pediatric multivitamin chewable tablet   pseudoephedrine 30 MG tablet Commonly known as: SUDAFED   Vitamin B-12 5000 MCG Tbdp     TAKE these medications   acetaminophen 325 MG tablet Commonly known as: TYLENOL Take 650 mg by mouth daily as needed for moderate pain or headache. Take 2  Daily as needed   cetirizine 10 MG tablet Commonly known as: ZYRTEC Take 10 mg by mouth daily.   ciprofloxacin 500 MG tablet Commonly known as: Cipro Take 1 tablet (500 mg total) by mouth 2 (two) times daily. Start day prior to office visit for foley removal   diphenhydrAMINE 25 MG tablet Commonly known as: BENADRYL Take 25 mg by mouth daily as needed for allergies.   doxycycline 100 MG capsule Commonly known as: VIBRAMYCIN Take 100 mg by mouth daily as needed (rosacea).   Finacea 15 % cream Generic drug: Azelaic Acid Apply topically as needed. After skin is thoroughly washed and patted dry, gently but thoroughly massage a thin film of azelaic acid cream into the affected area twice daily, in the morning and evening.   losartan-hydrochlorothiazide 100-12.5 MG tablet Commonly known as: HYZAAR Take 1 tablet by mouth daily.   metoprolol tartrate 25 MG tablet Commonly known as: LOPRESSOR Take 1 tablet (25 mg total) by mouth 2 (two) times daily. KEEP OV. What changed:   how much to take  when to take this  additional instructions   pantoprazole 40 MG tablet Commonly known as: PROTONIX Take 40 mg by mouth daily.   rosuvastatin 5  MG tablet Commonly known as: CRESTOR Take 5 mg by mouth at bedtime. Pt takes 5 mg daily at bedtime   sildenafil 20 MG tablet Commonly known as: REVATIO Take 20 mg by mouth daily as needed.   traMADol 50 MG tablet Commonly known as: ULTRAM Take 1-2 tablets (50-100 mg total) by mouth every 6 (six) hours as needed for moderate pain. What changed:   how much to  take  when to take this       Followup: He will followup in 1 week for catheter removal and to discuss his surgical pathology results.

## 2018-10-16 LAB — SURGICAL PATHOLOGY

## 2018-11-10 DIAGNOSIS — N3946 Mixed incontinence: Secondary | ICD-10-CM | POA: Diagnosis not present

## 2018-11-10 DIAGNOSIS — M6200 Separation of muscle (nontraumatic), unspecified site: Secondary | ICD-10-CM | POA: Diagnosis not present

## 2018-11-10 DIAGNOSIS — M6281 Muscle weakness (generalized): Secondary | ICD-10-CM | POA: Diagnosis not present

## 2018-11-10 DIAGNOSIS — M62838 Other muscle spasm: Secondary | ICD-10-CM | POA: Diagnosis not present

## 2018-11-22 ENCOUNTER — Other Ambulatory Visit: Payer: Self-pay | Admitting: Cardiology

## 2018-12-03 DIAGNOSIS — Z85828 Personal history of other malignant neoplasm of skin: Secondary | ICD-10-CM | POA: Diagnosis not present

## 2018-12-03 DIAGNOSIS — D485 Neoplasm of uncertain behavior of skin: Secondary | ICD-10-CM | POA: Diagnosis not present

## 2018-12-03 DIAGNOSIS — L308 Other specified dermatitis: Secondary | ICD-10-CM | POA: Diagnosis not present

## 2018-12-03 DIAGNOSIS — L570 Actinic keratosis: Secondary | ICD-10-CM | POA: Diagnosis not present

## 2018-12-12 DIAGNOSIS — M6200 Separation of muscle (nontraumatic), unspecified site: Secondary | ICD-10-CM | POA: Diagnosis not present

## 2018-12-12 DIAGNOSIS — M6281 Muscle weakness (generalized): Secondary | ICD-10-CM | POA: Diagnosis not present

## 2018-12-12 DIAGNOSIS — M62838 Other muscle spasm: Secondary | ICD-10-CM | POA: Diagnosis not present

## 2018-12-12 DIAGNOSIS — N393 Stress incontinence (female) (male): Secondary | ICD-10-CM | POA: Diagnosis not present

## 2018-12-16 ENCOUNTER — Other Ambulatory Visit: Payer: Self-pay

## 2018-12-16 DIAGNOSIS — Z20822 Contact with and (suspected) exposure to covid-19: Secondary | ICD-10-CM

## 2018-12-16 DIAGNOSIS — Z20828 Contact with and (suspected) exposure to other viral communicable diseases: Secondary | ICD-10-CM | POA: Diagnosis not present

## 2018-12-18 LAB — NOVEL CORONAVIRUS, NAA: SARS-CoV-2, NAA: NOT DETECTED

## 2019-01-21 DIAGNOSIS — C61 Malignant neoplasm of prostate: Secondary | ICD-10-CM | POA: Diagnosis not present

## 2019-01-22 DIAGNOSIS — C4442 Squamous cell carcinoma of skin of scalp and neck: Secondary | ICD-10-CM | POA: Diagnosis not present

## 2019-01-22 DIAGNOSIS — L300 Nummular dermatitis: Secondary | ICD-10-CM | POA: Diagnosis not present

## 2019-01-22 DIAGNOSIS — Z85828 Personal history of other malignant neoplasm of skin: Secondary | ICD-10-CM | POA: Diagnosis not present

## 2019-01-28 DIAGNOSIS — N5201 Erectile dysfunction due to arterial insufficiency: Secondary | ICD-10-CM | POA: Diagnosis not present

## 2019-01-28 DIAGNOSIS — N393 Stress incontinence (female) (male): Secondary | ICD-10-CM | POA: Diagnosis not present

## 2019-01-28 DIAGNOSIS — C61 Malignant neoplasm of prostate: Secondary | ICD-10-CM | POA: Diagnosis not present

## 2019-03-03 DIAGNOSIS — I10 Essential (primary) hypertension: Secondary | ICD-10-CM | POA: Diagnosis not present

## 2019-03-03 DIAGNOSIS — E7800 Pure hypercholesterolemia, unspecified: Secondary | ICD-10-CM | POA: Diagnosis not present

## 2019-03-10 DIAGNOSIS — I2510 Atherosclerotic heart disease of native coronary artery without angina pectoris: Secondary | ICD-10-CM | POA: Diagnosis not present

## 2019-03-11 DIAGNOSIS — Z23 Encounter for immunization: Secondary | ICD-10-CM | POA: Diagnosis not present

## 2019-04-08 DIAGNOSIS — Z23 Encounter for immunization: Secondary | ICD-10-CM | POA: Diagnosis not present

## 2019-04-29 DIAGNOSIS — L814 Other melanin hyperpigmentation: Secondary | ICD-10-CM | POA: Diagnosis not present

## 2019-04-29 DIAGNOSIS — D485 Neoplasm of uncertain behavior of skin: Secondary | ICD-10-CM | POA: Diagnosis not present

## 2019-04-29 DIAGNOSIS — L570 Actinic keratosis: Secondary | ICD-10-CM | POA: Diagnosis not present

## 2019-04-29 DIAGNOSIS — L578 Other skin changes due to chronic exposure to nonionizing radiation: Secondary | ICD-10-CM | POA: Diagnosis not present

## 2019-04-29 DIAGNOSIS — Z85828 Personal history of other malignant neoplasm of skin: Secondary | ICD-10-CM | POA: Diagnosis not present

## 2019-04-29 DIAGNOSIS — L308 Other specified dermatitis: Secondary | ICD-10-CM | POA: Diagnosis not present

## 2019-04-29 DIAGNOSIS — L820 Inflamed seborrheic keratosis: Secondary | ICD-10-CM | POA: Diagnosis not present

## 2019-07-23 DIAGNOSIS — C61 Malignant neoplasm of prostate: Secondary | ICD-10-CM | POA: Diagnosis not present

## 2019-08-06 DIAGNOSIS — N5201 Erectile dysfunction due to arterial insufficiency: Secondary | ICD-10-CM | POA: Diagnosis not present

## 2019-08-06 DIAGNOSIS — C61 Malignant neoplasm of prostate: Secondary | ICD-10-CM | POA: Diagnosis not present

## 2019-08-06 DIAGNOSIS — N393 Stress incontinence (female) (male): Secondary | ICD-10-CM | POA: Diagnosis not present

## 2019-08-18 ENCOUNTER — Telehealth: Payer: Self-pay | Admitting: *Deleted

## 2019-08-18 NOTE — Telephone Encounter (Signed)
Per the wife,she will have her husband call to schedule follow up visit.

## 2019-10-05 ENCOUNTER — Ambulatory Visit (INDEPENDENT_AMBULATORY_CARE_PROVIDER_SITE_OTHER): Payer: Medicare Other | Admitting: Cardiology

## 2019-10-05 ENCOUNTER — Encounter: Payer: Self-pay | Admitting: Cardiology

## 2019-10-05 ENCOUNTER — Other Ambulatory Visit: Payer: Self-pay

## 2019-10-05 VITALS — BP 122/76 | HR 60 | Ht 70.0 in | Wt 175.8 lb

## 2019-10-05 DIAGNOSIS — I10 Essential (primary) hypertension: Secondary | ICD-10-CM

## 2019-10-05 DIAGNOSIS — E785 Hyperlipidemia, unspecified: Secondary | ICD-10-CM | POA: Diagnosis not present

## 2019-10-05 DIAGNOSIS — I2510 Atherosclerotic heart disease of native coronary artery without angina pectoris: Secondary | ICD-10-CM | POA: Diagnosis not present

## 2019-10-05 DIAGNOSIS — Z951 Presence of aortocoronary bypass graft: Secondary | ICD-10-CM

## 2019-10-05 DIAGNOSIS — Z23 Encounter for immunization: Secondary | ICD-10-CM | POA: Diagnosis not present

## 2019-10-05 NOTE — Progress Notes (Signed)
Primary Care Provider: Deland Pretty, MD Cardiologist: No primary care provider on file. Electrophysiologist: None  Clinic Note: Chief Complaint  Patient presents with  . Follow-up    Annual.  Doing well.  . Coronary Artery Disease    Mostly limited by osteoarthritis pains.  No major issues.  No angina.   HPI:    [name] is a 74 y.o. male with a PMH notable for CAD having CABG, HLD (not willing to take PCSK9 inhibitors), hypertension who presents today for annual follow-up.  He was initially referred to me after treadmill stress test done in August 2012 that showed diffuse ST depressions with V1 and V2 ST elevations. I recommended cardiac catheterization which was performed by Dr Chase Picket - showed multivessel CAD and referred for CABG.  His last surveillance Myoview was in June 2017: No significant EKG changes & No ischemia/infarct.  EF 60%  [name] was last seen on on September 10, 2018--doing very well from cardiac standpoint.  Still playing golf but not really going to the gym to do exercise.  Using his treadmill at home 5 miles a day going to the highest incline and intensity. ->  This was for preop evaluation.  Recent Hospitalizations:   10/13/2018: Laparoscopic robotic assisted prostatectomy  Reviewed  CV studies:    The following studies were reviewed today: (if available, images/films reviewed: From Epic Chart or Care Everywhere) . None:   Interval History:   [name] returns here for routine follow-up doing quite well.  He has not been doing as much walking but has been doing work in the garage and Marketing executive.  He is not been walking as much because of his musculoskeletal issues.  Joint pain is really bothering him.  He has been noticing that his blood pressures been running a little low.  He switched the metoprolol to take the lower dose in the morning and higher dose in the evening, and has not really been taking his losartan-HCTZ every day  full dose.  He is usually with taking half a dose.  With that, his pressures have been a little bit better and he has been less dizzy.  Otherwise he is not having any real cardiac symptoms to speak of.  No palpitations, heart failure or angina symptoms.  CV Review of Symptoms (Summary): positive for - Intermittent dizziness negative for - chest pain, dyspnea on exertion, edema, irregular heartbeat, orthopnea, palpitations, paroxysmal nocturnal dyspnea, rapid heart rate, shortness of breath or Syncope or near syncope, TIA/amaurosis fugax, claudication. He did have some dizziness and lightheadedness associated with lower blood pressures that have now resolved since he is made medication changes.  The patient does not have symptoms concerning for COVID-19 infection (fever, chills, cough, or new shortness of breath).   REVIEWED OF SYSTEMS   Review of Systems  Constitutional: Positive for weight loss (A little loss, on purpose).  HENT: Negative for congestion and ear discharge.   Respiratory: Negative for shortness of breath.   Gastrointestinal: Negative for abdominal pain, blood in stool and melena.  Genitourinary: Positive for hematuria.  Musculoskeletal: Positive for back pain and joint pain. Negative for falls.  Neurological: Negative for focal weakness.  Endo/Heme/Allergies: Negative for environmental allergies.  Psychiatric/Behavioral: The patient is nervous/anxious.    I have reviewed and (if needed) personally updated the patient's problem list, medications, allergies, past medical and surgical history, social and family history.   PAST MEDICAL HISTORY   Past Medical History:  Diagnosis Date  . Acquired trigger finger of both middle fingers   . Acquired trigger finger of both ring fingers   . Arthritis   . CAD in native artery August 2012   Cardiac catheterization for abnormal treadmill portion of Myoview stress test -with diffuse market ST depressions in the inferior leads with ST  elevations in V1 and V2; no ischemia or infarction on images --> 70% distal left main, 90% ostial LAD with 60-70% lesions in proximal. Minimal RCA or circumflex disease. Subtotal proximal rPDA: Referred for CABG  . Cancer (HCC)    skin cancers-squamous cell / facial   . Dyslipidemia - low HDL    Lipids monitored by Dr. Shelia Media.  [name] GERD (gastroesophageal reflux disease)   . History of multiple allergies   . History of vertigo   . Hypertension    Labile  . Low testosterone    Recently stopped replacement therapy.  . Pneumonia   . Post-nasal drip   . Prostate cancer ([place])   . 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  . Urinary incontinence, urge   . Wears glasses     There is no immunization history on file for this patient.  \ PAST SURGICAL HISTORY   Past Surgical History:  Procedure Laterality Date  . arthroscopy of knees bilat     . CARDIAC CATHETERIZATION  08/18/2010   Terminal left main 70%, ostial LAD 90%, sequential 60-70% lesion in the proximal LAD, mild circumflex lesions, but large OM1 and OM2. RCA had moderate irregularities and a subtotal proximal PDA  . CARDIOVASCULAR STRESS TEST  02/06/2011   EKG negative for ischemia. No significant wall motion abnormalities noted.  [name] CATARACT EXTRACTION, BILATERAL    . CHOLECYSTECTOMY N/A 07/29/2015   Procedure: LAPAROSCOPIC CHOLECYSTECTOMY WITH INTRAOPERATIVE CHOLANGIOGRAM;  Surgeon: Jackolyn Confer, MD;  Location: [facility];  Service: General;  Laterality: N/A;  . COLONOSCOPY    . CORONARY ARTERY BYPASS GRAFT  08/29/2010   x4. LIMA to LAD, SVG to diag, SVG to OM, SVG to PDA. Rt thigh vein harvest  . LYMPHADENECTOMY Bilateral 10/13/2018   Procedure: LYMPHADENECTOMY, PELVIC;  Surgeon: Raynelle Bring, MD;  Location: [facility];  Service: Urology;  Laterality: Bilateral;  . MOHS SURGERY     Face and Neck Left eyelid  . NM MYOVIEW LTD  06/27/2015   EF 60%. No ischemia or infarction. NORMAL STUDY.  [name] PRE CABG DOPPLER   08/25/2010   No significant extracranial carotid artery stenosis. ECA stenosis noted bilaterally - ICA/CCA ratio id 0.9-rt and 0.74-lft  . ROBOT ASSISTED LAPAROSCOPIC RADICAL PROSTATECTOMY N/A 10/13/2018   Procedure: XI ROBOTIC ASSISTED LAPAROSCOPIC RADICAL PROSTATECTOMY LEVEL 2;  Surgeon: Raynelle Bring, MD;  Location: [facility];  Service: Urology;  Laterality: N/A;  . SURGERY SCROTAL / TESTICULAR     Benign nodule  . TONSILLECTOMY    . TRANSTHORACIC ECHOCARDIOGRAM  07/27/2010   EF >55%, mild concentric LVH, stage 1 diastolic dysfunction    There is no immunization history on file for this patient.  Phizer x 2 March  MEDICATIONS/ALLERGIES   Current Meds  Medication Sig  . acetaminophen (TYLENOL) 325 MG tablet Take 650 mg by mouth daily as needed for moderate pain or headache. Take 2  Daily as needed   . Azelaic Acid (FINACEA) 15 % cream Apply topically as needed. After skin is thoroughly washed and patted dry, gently but thoroughly massage a thin film of azelaic acid cream into the affected  area twice daily, in the morning and evening.   . diphenhydrAMINE (BENADRYL) 25 MG tablet Take 25 mg by mouth daily as needed for allergies.   [name] doxycycline (VIBRAMYCIN) 100 MG capsule Take 100 mg by mouth daily as needed (rosacea).   . fexofenadine (ALLEGRA) 60 MG tablet Take 60 mg by mouth daily.  [name] ipratropium (ATROVENT) 0.06 % nasal spray SMARTSIG:2 Spray(s) Both Nares 3 Times Daily PRN  . losartan-hydrochlorothiazide (HYZAAR) 100-12.5 MG tablet Take 1 tablet by mouth daily.  . metoprolol tartrate (LOPRESSOR) 25 MG tablet Take 25 mg by mouth See admin instructions. 25mg  in the AM and 12.5 mg in PM  . pantoprazole (PROTONIX) 40 MG tablet Take 40 mg by mouth daily.    . rosuvastatin (CRESTOR) 5 MG tablet Take 5 mg by mouth at bedtime. Pt takes 5 mg daily at bedtime  . sildenafil (REVATIO) 20 MG tablet Take 20 mg by mouth daily as needed.   . traMADol (ULTRAM) 50 MG tablet Take 1-2 tablets (50-100 mg  total) by mouth every 6 (six) hours as needed for moderate pain.  . [DISCONTINUED] cetirizine (ZYRTEC) 10 MG tablet Take 10 mg by mouth daily.  -He is only taking one half dose of the losartan-HCTZ; he has flipped his metoprolol dosing to taking full dose in the morning and half dose in the evening  Allergies  Allergen Reactions  . Cephalosporins Palpitations  . Triamterene-Hctz Palpitations  . Statins Other (See Comments)    " elevated muscle enzymes" Lipitor  . Vioxx [Rofecoxib] Swelling    Facial swelling  . Lisinopril Cough  . Sulfa Antibiotics Rash    SOCIAL HISTORY/FAMILY HISTORY   Reviewed in Epic:  Pertinent findings: No changes.  OBJCTIVE -PE, EKG, labs   Wt Readings from Last 3 Encounters:  10/05/19 175 lb 12.8 oz (79.7 kg)  10/13/18 180 lb 3 oz (81.7 kg)  10/09/18 180 lb 3 oz (81.7 kg)    Physical Exam: BP 122/76   Pulse 60   Ht 5\' 10"  (1.778 m)   Wt 175 lb 12.8 oz (79.7 kg)   SpO2 98%   BMI 25.22 kg/m  Physical Exam Constitutional:      General: He is not in acute distress.    Appearance: Normal appearance. He is normal weight. He is toxic-appearing. He is not ill-appearing.  HENT:     Head: Normocephalic and atraumatic.  Neck:     Vascular: No carotid bruit.  Cardiovascular:     Rate and Rhythm: Normal rate and regular rhythm.     Pulses: Normal pulses.     Heart sounds: Normal heart sounds.  Pulmonary:     Effort: Pulmonary effort is normal.     Breath sounds: Wheezing (Auditory without wearing stethoscope-upper respiratory finding, not noted on stethoscope exam.) present.  Abdominal:     General: Bowel sounds are normal. There is no distension.     Tenderness: There is no abdominal tenderness.     Hernia: No hernia is present.  Musculoskeletal:        General: No swelling. Normal range of motion.     Cervical back: Normal range of motion and neck supple.  Skin:    General: Skin is warm.  Neurological:     General: No focal deficit present.       Mental Status: He is alert and oriented to person, place, and time.  Psychiatric:        Mood and Affect: Mood normal.  Behavior: Behavior normal.        Thought Content: Thought content normal.        Judgment: Judgment normal.     Adult ECG Report  Rate: 60 ;  Rhythm: normal sinus rhythm and Incomplete right bundle branch block.  Lateral T wave inversions likely repolarization changes;   Narrative Interpretation: Stable  Recent Labs: Labs are being followed closely by PCP.  07/02/2019: TC 211, TG 170, HDL 44, LDL 87. Lab Results  Component Value Date   CHOL 211 (H) 03/05/2016   HDL 34 (L) 03/05/2016   LDLCALC 136 (H) 03/05/2016   TRIG 203 (H) 03/05/2016   CHOLHDL 6.2 (H) 03/05/2016   Lab Results  Component Value Date   CREATININE 1.05 10/09/2018   BUN 21 10/09/2018   NA 135 10/09/2018   K 4.0 10/09/2018   CL 99 10/09/2018   CO2 27 10/09/2018   No results found for: TSH  ASSESSMENT/PLAN    Problem List Items Addressed This Visit    CAD in native artery; Left Main and LAD and occluded RPDA; referred for CABG - Primary (Chronic)    No active angina since CABG.  His last Myoview was in 2017.  He is very active and not have any significant symptoms.  We probably can discuss routine surveillance follow-up Myoview next year.  He is on losartan-HCTZ with which he is only taken 1/2 tablet along with low-dose metoprolol.  He is on statin albeit low-dose.  He is also taking aspirin although it is not listed.      Relevant Medications   metoprolol tartrate (LOPRESSOR) 25 MG tablet   Other Relevant Orders   EKG 12-Lead (Completed)   Hypertension - labile (Chronic)    Looks pretty good today.  I am reluctant to make any changes, because he tends to adjust the medications according to how he is feeling. At present, his regimen seems to be stable.      Relevant Medications   metoprolol tartrate (LOPRESSOR) 25 MG tablet   Dyslipidemia (high LDL; low HDL) (Chronic)     His labs and lipid panel not quite where he would like them to be.  He says he is taking rosuvastatin 5 mg daily.  Low threshold to consider adding additional  5 mg.      S/P CABG x 4 (Chronic)   Relevant Orders   EKG 12-Lead (Completed)   Coronary artery disease involving native coronary artery of native heart without angina pectoris (Chronic)    Thankfully, no real anginal symptoms since his PCI.  Plan: Continue low-dose beta-blocker and ARB along with statin.  Supposed be on aspirin but not listed.  We will continue.  Has been statin intolerant, but tolerant of relatively low-dose carvedilol.  We may need to slightly increased dose.      Relevant Medications   metoprolol tartrate (LOPRESSOR) 25 MG tablet       COVID-19 Education: The signs and symptoms of COVID-19 were discussed with the patient and how to seek care for testing (follow up with PCP or arrange E-visit).   The importance of social distancing and COVID-19 vaccination was discussed today. 1 min The patient is practicing social distancing & Masking.   I spent a total of 82minutes with the patient spent in direct patient consultation.  Additional time spent with chart review  / charting (studies, outside notes, etc): 8 Total Time: 32 min   Current medicines are reviewed at length with the patient today.  (+/- concerns)  none  Notice: This dictation was prepared with Dragon dictation along with smaller phrase technology. Any transcriptional errors that result from this process are unintentional and may not be corrected upon review.  Patient Instructions / Medication Changes & Studies & Tests Ordered   Patient Instructions  Medication Instructions:  No changes *If you need a refill on your cardiac medications before your next appointment, please call your pharmacy*   Lab Work: None If you have labs (blood work) drawn today and your tests are completely normal, you will receive your results only by: [name] MyChart  Message (if you have MyChart) OR . A paper copy in the mail If you have any lab test that is abnormal or we need to change your treatment, we will call you to review the results.   Testing/Procedures: None   Follow-Up: At [facility], you and your health needs are our priority.  As part of our continuing mission to provide you with exceptional heart care, we have created designated Provider Care Teams.  These Care Teams include your primary Cardiologist (physician) and Advanced Practice Providers (APPs -  Physician Assistants and Nurse Practitioners) who all work together to provide you with the care you need, when you need it.  We recommend signing up for the patient portal called "MyChart".  Sign up information is provided on this After Visit Summary.  MyChart is used to connect with patients for Virtual Visits (Telemedicine).  Patients are able to view lab/test results, encounter notes, upcoming appointments, etc.  Non-urgent messages can be sent to your provider as well.   To learn more about what you can do with MyChart, go to [email or web address].    Your next appointment:   12 month(s)  The format for your next appointment:   In Person  Provider:    Follow up with Dr. Glenetta Hew     Other Instructions None     Studies Ordered:   Orders Placed This Encounter  Procedures  . EKG 12-Lead     Glenetta Hew, M.D., M.S. Interventional Cardiologist   Pager # [phone] Phone # [phone] [address]. [place], [place] [address]   Thank you for choosing Heartcare at [facility]!!

## 2019-10-05 NOTE — Patient Instructions (Signed)
Medication Instructions:  No changes *If you need a refill on your cardiac medications before your next appointment, please call your pharmacy*   Lab Work: None If you have labs (blood work) drawn today and your tests are completely normal, you will receive your results only by: [name] MyChart Message (if you have MyChart) OR . A paper copy in the mail If you have any lab test that is abnormal or we need to change your treatment, we will call you to review the results.   Testing/Procedures: None   Follow-Up: At [facility], you and your health needs are our priority.  As part of our continuing mission to provide you with exceptional heart care, we have created designated Provider Care Teams.  These Care Teams include your primary Cardiologist (physician) and Advanced Practice Providers (APPs -  Physician Assistants and Nurse Practitioners) who all work together to provide you with the care you need, when you need it.  We recommend signing up for the patient portal called "MyChart".  Sign up information is provided on this After Visit Summary.  MyChart is used to connect with patients for Virtual Visits (Telemedicine).  Patients are able to view lab/test results, encounter notes, upcoming appointments, etc.  Non-urgent messages can be sent to your provider as well.   To learn more about what you can do with MyChart, go to [email or web address].    Your next appointment:   12 month(s)  The format for your next appointment:   In Person  Provider:    Follow up with Dr. Glenetta Hew     Other Instructions None

## 2019-10-15 ENCOUNTER — Encounter: Payer: Self-pay | Admitting: Cardiology

## 2019-10-15 NOTE — Assessment & Plan Note (Signed)
Thankfully, no real anginal symptoms since his PCI.  Plan: Continue low-dose beta-blocker and ARB along with statin.  Supposed be on aspirin but not listed.  We will continue.  Has been statin intolerant, but tolerant of relatively low-dose carvedilol.  We may need to slightly increased dose.

## 2019-10-15 NOTE — Assessment & Plan Note (Addendum)
His labs and lipid panel not quite where he would like them to be.  He says he is taking rosuvastatin 5 mg daily.  Low threshold to consider adding additional  5 mg.

## 2019-10-15 NOTE — Assessment & Plan Note (Signed)
Looks pretty good today.  I am reluctant to make any changes, because he tends to adjust the medications according to how he is feeling. At present, his regimen seems to be stable.

## 2019-10-15 NOTE — Assessment & Plan Note (Addendum)
No active angina since CABG.  His last Myoview was in 2017.  He is very active and not have any significant symptoms.  We probably can discuss routine surveillance follow-up Myoview next year.  He is on losartan-HCTZ with which he is only taken 1/2 tablet along with low-dose metoprolol.  He is on statin albeit low-dose.  He is also taking aspirin although it is not listed.

## 2020-01-04 DIAGNOSIS — Z23 Encounter for immunization: Secondary | ICD-10-CM | POA: Diagnosis not present

## 2020-01-05 DIAGNOSIS — L570 Actinic keratosis: Secondary | ICD-10-CM | POA: Diagnosis not present

## 2020-01-05 DIAGNOSIS — L814 Other melanin hyperpigmentation: Secondary | ICD-10-CM | POA: Diagnosis not present

## 2020-01-05 DIAGNOSIS — D044 Carcinoma in situ of skin of scalp and neck: Secondary | ICD-10-CM | POA: Diagnosis not present

## 2020-01-05 DIAGNOSIS — D2261 Melanocytic nevi of right upper limb, including shoulder: Secondary | ICD-10-CM | POA: Diagnosis not present

## 2020-01-05 DIAGNOSIS — Z85828 Personal history of other malignant neoplasm of skin: Secondary | ICD-10-CM | POA: Diagnosis not present

## 2020-01-05 DIAGNOSIS — D225 Melanocytic nevi of trunk: Secondary | ICD-10-CM | POA: Diagnosis not present

## 2020-01-05 DIAGNOSIS — D1801 Hemangioma of skin and subcutaneous tissue: Secondary | ICD-10-CM | POA: Diagnosis not present

## 2020-01-05 DIAGNOSIS — D2262 Melanocytic nevi of left upper limb, including shoulder: Secondary | ICD-10-CM | POA: Diagnosis not present

## 2020-01-12 DIAGNOSIS — Z20822 Contact with and (suspected) exposure to covid-19: Secondary | ICD-10-CM | POA: Diagnosis not present

## 2020-02-19 DIAGNOSIS — C61 Malignant neoplasm of prostate: Secondary | ICD-10-CM | POA: Diagnosis not present

## 2020-02-24 DIAGNOSIS — N5201 Erectile dysfunction due to arterial insufficiency: Secondary | ICD-10-CM | POA: Diagnosis not present

## 2020-02-24 DIAGNOSIS — N393 Stress incontinence (female) (male): Secondary | ICD-10-CM | POA: Diagnosis not present

## 2020-02-24 DIAGNOSIS — C61 Malignant neoplasm of prostate: Secondary | ICD-10-CM | POA: Diagnosis not present

## 2020-03-25 ENCOUNTER — Other Ambulatory Visit: Payer: Self-pay | Admitting: Cardiology

## 2020-05-31 DIAGNOSIS — Z23 Encounter for immunization: Secondary | ICD-10-CM | POA: Diagnosis not present

## 2020-06-10 DIAGNOSIS — K1120 Sialoadenitis, unspecified: Secondary | ICD-10-CM | POA: Diagnosis not present

## 2020-06-20 DIAGNOSIS — L309 Dermatitis, unspecified: Secondary | ICD-10-CM | POA: Diagnosis not present

## 2020-06-20 DIAGNOSIS — Z85828 Personal history of other malignant neoplasm of skin: Secondary | ICD-10-CM | POA: Diagnosis not present

## 2020-09-16 DIAGNOSIS — Z125 Encounter for screening for malignant neoplasm of prostate: Secondary | ICD-10-CM | POA: Diagnosis not present

## 2020-09-16 DIAGNOSIS — I10 Essential (primary) hypertension: Secondary | ICD-10-CM | POA: Diagnosis not present

## 2020-09-16 DIAGNOSIS — E7800 Pure hypercholesterolemia, unspecified: Secondary | ICD-10-CM | POA: Diagnosis not present

## 2020-09-23 DIAGNOSIS — I10 Essential (primary) hypertension: Secondary | ICD-10-CM | POA: Diagnosis not present

## 2020-09-23 DIAGNOSIS — M25542 Pain in joints of left hand: Secondary | ICD-10-CM | POA: Diagnosis not present

## 2020-09-23 DIAGNOSIS — J387 Other diseases of larynx: Secondary | ICD-10-CM | POA: Diagnosis not present

## 2020-09-23 DIAGNOSIS — N529 Male erectile dysfunction, unspecified: Secondary | ICD-10-CM | POA: Diagnosis not present

## 2020-09-23 DIAGNOSIS — Z951 Presence of aortocoronary bypass graft: Secondary | ICD-10-CM | POA: Diagnosis not present

## 2020-09-23 DIAGNOSIS — M25541 Pain in joints of right hand: Secondary | ICD-10-CM | POA: Diagnosis not present

## 2020-09-23 DIAGNOSIS — I2510 Atherosclerotic heart disease of native coronary artery without angina pectoris: Secondary | ICD-10-CM | POA: Diagnosis not present

## 2020-09-23 DIAGNOSIS — J309 Allergic rhinitis, unspecified: Secondary | ICD-10-CM | POA: Diagnosis not present

## 2020-09-23 DIAGNOSIS — Z8546 Personal history of malignant neoplasm of prostate: Secondary | ICD-10-CM | POA: Diagnosis not present

## 2020-09-23 DIAGNOSIS — E7800 Pure hypercholesterolemia, unspecified: Secondary | ICD-10-CM | POA: Diagnosis not present

## 2020-09-28 DIAGNOSIS — E349 Endocrine disorder, unspecified: Secondary | ICD-10-CM | POA: Diagnosis not present

## 2020-09-28 DIAGNOSIS — C61 Malignant neoplasm of prostate: Secondary | ICD-10-CM | POA: Diagnosis not present

## 2020-10-05 DIAGNOSIS — C61 Malignant neoplasm of prostate: Secondary | ICD-10-CM | POA: Diagnosis not present

## 2020-10-05 DIAGNOSIS — E349 Endocrine disorder, unspecified: Secondary | ICD-10-CM | POA: Diagnosis not present

## 2020-10-05 DIAGNOSIS — N5201 Erectile dysfunction due to arterial insufficiency: Secondary | ICD-10-CM | POA: Diagnosis not present

## 2020-10-06 DIAGNOSIS — E7800 Pure hypercholesterolemia, unspecified: Secondary | ICD-10-CM | POA: Diagnosis not present

## 2020-10-10 ENCOUNTER — Ambulatory Visit: Payer: Medicare Other | Admitting: Cardiology

## 2020-10-18 ENCOUNTER — Other Ambulatory Visit: Payer: Self-pay

## 2020-10-18 ENCOUNTER — Ambulatory Visit (INDEPENDENT_AMBULATORY_CARE_PROVIDER_SITE_OTHER): Payer: Medicare Other | Admitting: Cardiology

## 2020-10-18 ENCOUNTER — Encounter: Payer: Self-pay | Admitting: Cardiology

## 2020-10-18 VITALS — BP 120/69 | HR 67 | Ht 70.0 in | Wt 179.4 lb

## 2020-10-18 DIAGNOSIS — I10 Essential (primary) hypertension: Secondary | ICD-10-CM | POA: Diagnosis not present

## 2020-10-18 DIAGNOSIS — E785 Hyperlipidemia, unspecified: Secondary | ICD-10-CM

## 2020-10-18 DIAGNOSIS — I2510 Atherosclerotic heart disease of native coronary artery without angina pectoris: Secondary | ICD-10-CM

## 2020-10-18 DIAGNOSIS — Z951 Presence of aortocoronary bypass graft: Secondary | ICD-10-CM | POA: Diagnosis not present

## 2020-10-18 NOTE — Patient Instructions (Signed)
Medication Instructions:   No changes *If you need a refill on your cardiac medications before your next appointment, please call your pharmacy*   Lab Work:  Not needed   Testing/Procedures:  Will be done Sept 2023  at [place] has requested that you have a lexiscan myoview. Please follow instruction sheet, as given.   Follow-Up: At [facility], you and your health needs are our priority.  As part of our continuing mission to provide you with exceptional heart care, we have created designated Provider Care Teams.  These Care Teams include your primary Cardiologist (physician) and Advanced Practice Providers (APPs -  Physician Assistants and Nurse Practitioners) who all work together to provide you with the care you need, when you need it.     Your next appointment:   12 month(s)  The format for your next appointment:   In Person  Provider:   Glenetta Hew, MD   Other Instructions

## 2020-10-18 NOTE — Progress Notes (Signed)
Primary Care Provider: Deland Pretty, MD Cardiologist: None Electrophysiologist: None  Clinic Note: Chief Complaint  Patient presents with   Follow-up    Annual.  Doing well   Coronary Artery Disease    No angina    ===================================  ASSESSMENT/PLAN   Problem List Items Addressed This Visit       Cardiology Problems   Hypertension - labile (Chronic)    His BP goes up and down.  Reluctant to really adjust too much.  He is on pretty stable dose of losartan-HCTZ and very low-dose of beta-blocker where he takes half tablet in the morning and 1 at night.      Relevant Orders   EKG 12-Lead (Completed)   MYOCARDIAL PERFUSION IMAGING   Cardiac Stress Test: Informed Consent Details: Physician/Practitioner Attestation; Transcribe to consent form and obtain patient signature   Coronary artery disease involving native coronary artery of native heart without angina pectoris - Primary (Chronic)    Interestingly, prior to his CABG he did not have any anginal symptoms.  Hoping that he can pick up his activity level can continue to monitor for symptom change.  Based on the fact that he was very symptomatic in the past, he would be definitely somebody that we want to monitor routinely with stress test.  He will be 5 years out next year.  We will order a Myoview for next fall.  He is on low-dose beta-blocker.  Relatively intolerant to statins but on a very mild dose. On ARB. Is taking aspirin but no other antiplatelet agent.  No changes for now.      Relevant Orders   EKG 12-Lead (Completed)   MYOCARDIAL PERFUSION IMAGING   Cardiac Stress Test: Informed Consent Details: Physician/Practitioner Attestation; Transcribe to consent form and obtain patient signature     Other   Dyslipidemia (high LDL; low HDL) (Chronic)    GI intolerance to Zetia, back on just 5 and occasional 10 mg of rosuvastatin.  LFTs have been up a little bit, so this is being adjusted a little  bit.  Most recent lipids look okay with LDL 69.  Based on new guidelines, LDL goal should be less than 55.  Based on his intolerance, I think to get down FFR would probably need to consider Nexletol plus or minus PCSK9 inhibitor or inclisiran.      Relevant Orders   EKG 12-Lead (Completed)   MYOCARDIAL PERFUSION IMAGING   Cardiac Stress Test: Informed Consent Details: Physician/Practitioner Attestation; Transcribe to consent form and obtain patient signature   S/P CABG x 4 (Chronic)    CABG in 2012.  Last Myoview was in December 2017.  Due for surveillance Myoview summer-fall 2023.  This is especially because of his lack of symptoms before.  Plan: Follow-up Myoview in September 2023.  This will be prior to his annual follow-up. (See consent statement above)      Relevant Orders   EKG 12-Lead (Completed)   MYOCARDIAL PERFUSION IMAGING   Cardiac Stress Test: Informed Consent Details: Physician/Practitioner Attestation; Transcribe to consent form and obtain patient signature    ===================================  HPI:    AMAURIS DEBOIS is a 75 y.o. male with a PMH notable for CAD-CABG (was relatively asymptomatic, stress test done because of abnormal EKG), HLD (not willing to take PCSK9 inhibitor, but intolerant of other medicines), and HTN who presents today for annual follow-up.  August 2012 -> PCP noted abnormal EKG, GXT ordered showing diffuse ST depressions in V1 and V2 with  ST elevation in aVR. => Cardiac catheterization recommended, performed by Dr. Rex Kras = multivessel CAD => CABG Most recent Myoview was in June 2017 - NO ISCHEMIA OR INFARCTION  LONDON TARNOWSKI was last seen on 10/05/2019 -> was doing quite well.  He is not doing as much walking around as much as he used to be.  Has had a lot of musculoskeletal issues with joint pains and knee pains.  Also noted blood pressures running a little bit low he switched his metoprolol dose to lower dose morning and higher dose in the  evening and was not taking the full dose of losartan HCTZ.  Usually taking one half dose.  Still having little bit of intermittent dizziness - but better  Recent Hospitalizations: None  Reviewed  CV studies:    The following studies were reviewed today: (if available, images/films reviewed: From Epic Chart or Care Everywhere) None:  Interval History:   ZACK CRAGER returns for annual follow-up today doing well with no major cardiac complaints.  He just not as mobile as he once was citing osteoarthritis pains.  He also has been under a lot of social stress -> he and his wife are now, caregivers are his brother-in-law as well as mother and so is not having as much opportunity get in his routine exercises and stress relief activities for himself.  He really is not had any cardiac symptoms, nor did he really ever have cardiac symptoms to begin with.  Still has intermittent dizziness but no real palpitations associated with it.    Management lipids to be difficult.  He is currently taking 5 mg of rosuvastatin and his LFTs were little off.  In the past we have tried Zetia and he did not tolerate because of GI issues.  But now that is fine.  His lipids were not that bad the last check with LDL now 69.  Hoping that with reducing his statin to 5 mg that his lipids were not worsen.  CV Review of Symptoms (Summary) Cardiovascular ROS: no chest pain or dyspnea on exertion positive for - -a little exercise intolerance mostly because of deconditioning, also some positional dizziness.  No syncope or near syncope. negative for - edema, irregular heartbeat, orthopnea, palpitations, paroxysmal nocturnal dyspnea, rapid heart rate, shortness of breath, or TIA/amaurosis fugax or Claudication  REVIEWED OF SYSTEMS   Review of Systems  Constitutional:  Negative for malaise/fatigue (A little bit deconditioned) and weight loss.  HENT:  Negative for congestion and nosebleeds.   Respiratory: Negative.     Cardiovascular:        Per HPI  Gastrointestinal:  Negative for blood in stool and melena.  Genitourinary:  Negative for hematuria.  Musculoskeletal:  Positive for joint pain. Negative for falls.  Neurological:  Negative for dizziness (Not really.  Rare), focal weakness and weakness.  Psychiatric/Behavioral: Negative.     I have reviewed and (if needed) personally updated the patient's problem list, medications, allergies, past medical and surgical history, social and family history.   PAST MEDICAL HISTORY   Past Medical History:  Diagnosis Date   Acquired trigger finger of both middle fingers    Acquired trigger finger of both ring fingers    Arthritis    CAD in native artery August 2012   Cardiac catheterization for abnormal treadmill portion of Myoview stress test -with diffuse market ST depressions in the inferior leads with ST elevations in V1 and V2; no ischemia or infarction on images --> 70% distal left  main, 90% ostial LAD with 60-70% lesions in proximal. Minimal RCA or circumflex disease. Subtotal proximal rPDA: Referred for CABG   Cancer (HCC)    skin cancers-squamous cell / facial    Dyslipidemia - low HDL    Lipids monitored by Dr. Shelia Media.   GERD (gastroesophageal reflux disease)    History of multiple allergies    History of vertigo    Hypertension    Labile   Low testosterone    Recently stopped replacement therapy.   Pneumonia    Post-nasal drip    Prostate cancer ([place])    S/P CABG x 12 August 2010   LIMA-LAD, SVG-PDA, SVG to OM, SVG-D1.   Urinary incontinence, urge    Wears glasses     PAST SURGICAL HISTORY   Past Surgical History:  Procedure Laterality Date   arthroscopy of knees bilat      CARDIAC CATHETERIZATION  08/18/2010   Terminal left main 70%, ostial LAD 90%, sequential 60-70% lesion in the proximal LAD, mild circumflex lesions, but large OM1 and OM2. RCA had moderate irregularities and a subtotal proximal PDA   CARDIOVASCULAR STRESS TEST   02/06/2011   EKG negative for ischemia. No significant wall motion abnormalities noted.   CATARACT EXTRACTION, BILATERAL     CHOLECYSTECTOMY N/A 07/29/2015   Procedure: LAPAROSCOPIC CHOLECYSTECTOMY WITH INTRAOPERATIVE CHOLANGIOGRAM;  Surgeon: Jackolyn Confer, MD;  Location: [facility];  Service: General;  Laterality: N/A;   COLONOSCOPY     CORONARY ARTERY BYPASS GRAFT  08/29/2010   x4. LIMA to LAD, SVG to diag, SVG to OM, SVG to PDA. Rt thigh vein harvest   LYMPHADENECTOMY Bilateral 10/13/2018   Procedure: LYMPHADENECTOMY, PELVIC;  Surgeon: Raynelle Bring, MD;  Location: [facility];  Service: Urology;  Laterality: Bilateral;   MOHS SURGERY     Face and Neck Left eyelid   NM MYOVIEW LTD  06/27/2015   EF 60%. No ischemia or infarction. NORMAL STUDY.   PRE CABG DOPPLER  08/25/2010   No significant extracranial carotid artery stenosis. ECA stenosis noted bilaterally - ICA/CCA ratio id 0.9-rt and 0.74-lft   ROBOT ASSISTED LAPAROSCOPIC RADICAL PROSTATECTOMY N/A 10/13/2018   Procedure: XI ROBOTIC ASSISTED LAPAROSCOPIC RADICAL PROSTATECTOMY LEVEL 2;  Surgeon: Raynelle Bring, MD;  Location: [facility];  Service: Urology;  Laterality: N/A;   SURGERY SCROTAL / TESTICULAR     Benign nodule   TONSILLECTOMY     TRANSTHORACIC ECHOCARDIOGRAM  07/27/2010   EF >55%, mild concentric LVH, stage 1 diastolic dysfunction    Immunization History  Administered Date(s) Administered   PFIZER(Purple Top)SARS-COV-2 Vaccination 03/11/2019, 04/01/2019    MEDICATIONS/ALLERGIES   Current Meds  Medication Sig   acetaminophen (TYLENOL) 325 MG tablet Take 650 mg by mouth daily as needed for moderate pain or headache. Take 2  Daily as needed    Azelaic Acid 15 % gel Apply topically as needed. After skin is thoroughly washed and patted dry, gently but thoroughly massage a thin film of azelaic acid cream into the affected area twice daily, in the morning and evening.    Calcium Carbonate-Vitamin D 600-400 MG-UNIT tablet Place 600 mg  inside cheek daily.   diphenhydrAMINE (BENADRYL) 25 MG tablet Take 25 mg by mouth daily as needed for allergies.    doxycycline (VIBRAMYCIN) 100 MG capsule Take 100 mg by mouth daily as needed (rosacea).    fexofenadine (ALLEGRA) 60 MG tablet Take 60 mg by mouth daily.   ipratropium (ATROVENT) 0.06 % nasal spray SMARTSIG:2 Spray(s) Both Nares 3 Times  Daily PRN   losartan-hydrochlorothiazide (HYZAAR) 100-12.5 MG tablet Take 1 tablet by mouth daily.   metoprolol tartrate (LOPRESSOR) 25 MG tablet TAKE 1 TABLET TWICE A DAY   pantoprazole (PROTONIX) 40 MG tablet Take 40 mg by mouth daily.     rosuvastatin (CRESTOR) 5 MG tablet Take 5 mg by mouth at bedtime. Pt takes 5 mg daily at bedtime   sildenafil (REVATIO) 20 MG tablet Take 20 mg by mouth daily as needed.    traMADol (ULTRAM) 50 MG tablet Take 1-2 tablets (50-100 mg total) by mouth every 6 (six) hours as needed for moderate pain.    Allergies  Allergen Reactions   Cephalosporins Palpitations   Triamterene-Hctz Palpitations   Statins Other (See Comments)    " elevated muscle enzymes" Lipitor   Vioxx [Rofecoxib] Swelling    Facial swelling   Zetia [Ezetimibe]     Increased muscle enzymes; GI upset   Lisinopril Cough   Sulfa Antibiotics Rash    SOCIAL HISTORY/FAMILY HISTORY   Reviewed in Epic:  Pertinent findings:  Social History   Tobacco Use   Smoking status: Never   Smokeless tobacco: Former    Types: Chew    Quit date: 01/08/2005  Vaping Use   Vaping Use: Never used  Substance Use Topics   Alcohol use: No   Drug use: No   Social History   Social History Narrative   He is a married father of 2.   He does not smoke or drink alcohol.   Up until recently when he hurt his left knee, he was continuing to exercise vigorously at least 2-3 times a week sometimes up to 2 hours of time doing cardiovascular and muscle strength exercises.    OBJCTIVE -PE, EKG, labs   Wt Readings from Last 3 Encounters:  10/18/20 179 lb 6.4 oz  (81.4 kg)  10/05/19 175 lb 12.8 oz (79.7 kg)  10/13/18 180 lb 3 oz (81.7 kg)   Physical Exam: BP 120/69   Pulse 67   Ht 5\' 10"  (1.778 m)   Wt 179 lb 6.4 oz (81.4 kg)   SpO2 98%   BMI 25.74 kg/m  Physical Exam Vitals reviewed.  Constitutional:      General: He is not in acute distress.    Appearance: Normal appearance. He is normal weight. He is not toxic-appearing.     Comments: Healthy appearing.  Well-groomed.  Well-nourished.  HENT:     Head: Normocephalic and atraumatic.  Neck:     Vascular: No carotid bruit.  Cardiovascular:     Rate and Rhythm: Normal rate and regular rhythm.     Pulses: Normal pulses.     Heart sounds: Normal heart sounds. No murmur heard.   No friction rub. No gallop.  Pulmonary:     Effort: Pulmonary effort is normal. No respiratory distress.     Breath sounds: Normal breath sounds.  Chest:     Chest wall: No tenderness.  Musculoskeletal:        General: No swelling. Normal range of motion.     Cervical back: Normal range of motion and neck supple.  Skin:    General: Skin is warm and dry.  Neurological:     General: No focal deficit present.     Mental Status: He is alert and oriented to person, place, and time. Mental status is at baseline.     Gait: Gait normal.  Psychiatric:        Mood and Affect: Mood normal.  Behavior: Behavior normal.        Thought Content: Thought content normal.        Judgment: Judgment normal.    Adult ECG Report  Rate: 67 ;  Rhythm: normal sinus rhythm and incomplete RBBB.  Lateral T wave versions.  Otherwise normal axis, intervals and durations. ;   Narrative Interpretation: Stable  Recent Labs: Reviewed from PCP - 10/06/2020:  Na+ 139, K+4.8, Cl- 101, HCO3- 33 , BUN 17, Cr 1.31, Glu 95 Ca2+ 9.3; AST 48, ALT 92, AlkP 102 TC 152, TG 184, HDL 46, LDL 69 09/16/2020: CBC: W 5.1, H/H 15.2/43.8, Plt 331  ==================================================  COVID-19 Education: The signs and symptoms of  COVID-19 were discussed with the patient and how to seek care for testing (follow up with PCP or arrange E-visit).    I spent a total of 20 Minutes with the patient spent in direct patient consultation.  Additional time spent with chart review  / charting (studies, outside notes, etc): 18 min Total Time: 38 min  Current medicines are reviewed at length with the patient today.  (+/- concerns) none  This visit occurred during the SARS-CoV-2 public health emergency.  Safety protocols were in place, including screening questions prior to the visit, additional usage of staff PPE, and extensive cleaning of exam room while observing appropriate contact time as indicated for disinfecting solutions.  Notice: This dictation was prepared with Dragon dictation along with smart phrase technology. Any transcriptional errors that result from this process are unintentional and may not be corrected upon review.  Shared Decision Making/Informed Consent{The risks [chest pain, shortness of breath, cardiac arrhythmias, dizziness, blood pressure fluctuations, myocardial infarction, stroke/transient ischemic attack, nausea, vomiting, allergic reaction, radiation exposure, metallic taste sensation and life-threatening complications (estimated to be 1 in 10,000)], benefits (risk stratification, diagnosing coronary artery disease, treatment guidance) and alternatives of a nuclear stress test were discussed in detail with Mr. Siefert and he agrees to proceed.   Patient Instructions / Medication Changes & Studies & Tests Ordered   Patient Instructions  Medication Instructions:   No changes *If you need a refill on your cardiac medications before your next appointment, please call your pharmacy*   Lab Work:  Not needed   Testing/Procedures:  Will be done Sept 2023  at [place] has requested that you have a lexiscan myoview. Please follow instruction sheet, as given.    Follow-Up: At [facility], you and your health needs are our priority.  As part of our continuing mission to provide you with exceptional heart care, we have created designated Provider Care Teams.  These Care Teams include your primary Cardiologist (physician) and Advanced Practice Providers (APPs -  Physician Assistants and Nurse Practitioners) who all work together to provide you with the care you need, when you need it.     Your next appointment:   12 month(s)  The format for your next appointment:   In Person  Provider:   Glenetta Hew, MD   Other Instructions    Studies Ordered:   Orders Placed This Encounter  Procedures   Cardiac Stress Test: Informed Consent Details: Physician/Practitioner Attestation; Transcribe to consent form and obtain patient signature   MYOCARDIAL PERFUSION IMAGING   EKG 12-Lead      Glenetta Hew, M.D., M.S. Interventional Cardiologist   Pager # [phone] Phone # [phone] [address] [place], [place] [address]   Thank you for choosing Heartcare at [facility]!!

## 2020-10-19 DIAGNOSIS — M79642 Pain in left hand: Secondary | ICD-10-CM | POA: Diagnosis not present

## 2020-10-19 DIAGNOSIS — M25511 Pain in right shoulder: Secondary | ICD-10-CM | POA: Diagnosis not present

## 2020-10-20 DIAGNOSIS — Z23 Encounter for immunization: Secondary | ICD-10-CM | POA: Diagnosis not present

## 2020-10-27 DIAGNOSIS — U071 COVID-19: Secondary | ICD-10-CM | POA: Diagnosis not present

## 2020-10-30 DIAGNOSIS — F17220 Nicotine dependence, chewing tobacco, uncomplicated: Secondary | ICD-10-CM | POA: Diagnosis not present

## 2020-10-30 DIAGNOSIS — R002 Palpitations: Secondary | ICD-10-CM | POA: Diagnosis not present

## 2020-10-30 DIAGNOSIS — Z7982 Long term (current) use of aspirin: Secondary | ICD-10-CM | POA: Diagnosis not present

## 2020-10-30 DIAGNOSIS — I10 Essential (primary) hypertension: Secondary | ICD-10-CM | POA: Diagnosis not present

## 2020-10-30 DIAGNOSIS — R079 Chest pain, unspecified: Secondary | ICD-10-CM | POA: Diagnosis not present

## 2020-10-30 DIAGNOSIS — R001 Bradycardia, unspecified: Secondary | ICD-10-CM | POA: Diagnosis not present

## 2020-10-30 DIAGNOSIS — Z882 Allergy status to sulfonamides status: Secondary | ICD-10-CM | POA: Diagnosis not present

## 2020-10-30 DIAGNOSIS — R42 Dizziness and giddiness: Secondary | ICD-10-CM | POA: Diagnosis not present

## 2020-10-30 DIAGNOSIS — E785 Hyperlipidemia, unspecified: Secondary | ICD-10-CM | POA: Diagnosis not present

## 2020-10-30 DIAGNOSIS — K219 Gastro-esophageal reflux disease without esophagitis: Secondary | ICD-10-CM | POA: Diagnosis not present

## 2020-10-30 DIAGNOSIS — Z79899 Other long term (current) drug therapy: Secondary | ICD-10-CM | POA: Diagnosis not present

## 2020-10-30 DIAGNOSIS — Z951 Presence of aortocoronary bypass graft: Secondary | ICD-10-CM | POA: Diagnosis not present

## 2020-10-30 DIAGNOSIS — Z881 Allergy status to other antibiotic agents status: Secondary | ICD-10-CM | POA: Diagnosis not present

## 2020-10-30 DIAGNOSIS — Z888 Allergy status to other drugs, medicaments and biological substances status: Secondary | ICD-10-CM | POA: Diagnosis not present

## 2020-10-30 DIAGNOSIS — I2510 Atherosclerotic heart disease of native coronary artery without angina pectoris: Secondary | ICD-10-CM | POA: Diagnosis not present

## 2020-10-31 ENCOUNTER — Telehealth: Payer: Self-pay | Admitting: Cardiology

## 2020-10-31 NOTE — Telephone Encounter (Signed)
Returned call to patient of Dr. Garrel Ridgel he went to church - reports it was hot in there so he took off his coat. Then he became clammy, hot, breaking out in cold sweat, belching, "didn't feel real good", maybe a "hair dizzy, but not much"  He went to ED yesterday at [facility] - everything checked out OK per his report -- -- notes to epic   Did not think he was dehydrated or any issues with low blood sugar (elevated per ED labs)   He was seen on 10/11 - stress test ordered to be done in 2023  Routed to MD/RN to review and advise

## 2020-10-31 NOTE — Telephone Encounter (Signed)
Patient wanted to let Dr. Ellyn Hack know that he went to [facility] at [facility] as a precaution yesterday. He was having some symptoms while at church and his wife urged him to get checked out.  He wanted to let Dr. Ellyn Hack know and he can look at all the test results in the portal there too.

## 2020-11-01 DIAGNOSIS — M25511 Pain in right shoulder: Secondary | ICD-10-CM | POA: Diagnosis not present

## 2020-11-01 NOTE — Telephone Encounter (Signed)
Spoke with patient and relayed message from MD about scheduling stress test sooner. He voiced understanding.   Staff message routed to MD/PCC pool to assist in setting up test.

## 2020-11-01 NOTE — Telephone Encounter (Signed)
We can do ST sooner if he would like - no need to wait if symptoms are present.  Glenetta Hew, MD

## 2020-11-07 DIAGNOSIS — M25511 Pain in right shoulder: Secondary | ICD-10-CM | POA: Diagnosis not present

## 2020-11-08 HISTORY — PX: NM MYOVIEW LTD: HXRAD82

## 2020-11-09 ENCOUNTER — Telehealth (HOSPITAL_COMMUNITY): Payer: Self-pay | Admitting: *Deleted

## 2020-11-09 NOTE — Telephone Encounter (Signed)

## 2020-11-13 ENCOUNTER — Encounter: Payer: Self-pay | Admitting: Cardiology

## 2020-11-13 NOTE — Assessment & Plan Note (Signed)
GI intolerance to Zetia, back on just 5 and occasional 10 mg of rosuvastatin.  LFTs have been up a little bit, so this is being adjusted a little bit.  Most recent lipids look okay with LDL 69.  Based on new guidelines, LDL goal should be less than 55.  Based on his intolerance, I think to get down FFR would probably need to consider Nexletol plus or minus PCSK9 inhibitor or inclisiran.

## 2020-11-13 NOTE — Assessment & Plan Note (Signed)
CABG in 2012.  Last Myoview was in December 2017.  Due for surveillance Myoview summer-fall 2023.  This is especially because of his lack of symptoms before.  Plan: Follow-up Myoview in September 2023.  This will be prior to his annual follow-up. (See consent statement above)

## 2020-11-13 NOTE — Assessment & Plan Note (Signed)
His BP goes up and down.  Reluctant to really adjust too much.  He is on pretty stable dose of losartan-HCTZ and very low-dose of beta-blocker where he takes half tablet in the morning and 1 at night.

## 2020-11-13 NOTE — Assessment & Plan Note (Signed)
Interestingly, prior to his CABG he did not have any anginal symptoms.  Hoping that he can pick up his activity level can continue to monitor for symptom change.  Based on the fact that he was very symptomatic in the past, he would be definitely somebody that we want to monitor routinely with stress test.  He will be 5 years out next year.  We will order a Myoview for next fall.  He is on low-dose beta-blocker.  Relatively intolerant to statins but on a very mild dose. On ARB. Is taking aspirin but no other antiplatelet agent.  No changes for now.

## 2020-11-14 ENCOUNTER — Ambulatory Visit (HOSPITAL_COMMUNITY): Payer: Medicare Other | Attending: Cardiology

## 2020-11-14 ENCOUNTER — Other Ambulatory Visit: Payer: Self-pay

## 2020-11-14 DIAGNOSIS — E785 Hyperlipidemia, unspecified: Secondary | ICD-10-CM | POA: Diagnosis not present

## 2020-11-14 DIAGNOSIS — Z951 Presence of aortocoronary bypass graft: Secondary | ICD-10-CM | POA: Diagnosis not present

## 2020-11-14 DIAGNOSIS — I2510 Atherosclerotic heart disease of native coronary artery without angina pectoris: Secondary | ICD-10-CM | POA: Insufficient documentation

## 2020-11-14 DIAGNOSIS — I10 Essential (primary) hypertension: Secondary | ICD-10-CM | POA: Diagnosis not present

## 2020-11-14 LAB — MYOCARDIAL PERFUSION IMAGING
Base ST Depression (mm): 0 mm
LV dias vol: 61 mL (ref 62–150)
LV sys vol: 20 mL
Nuc Stress EF: 67 %
Peak HR: 86 {beats}/min
Rest HR: 63 {beats}/min
Rest Nuclear Isotope Dose: 10.2 mCi
SDS: 0
SRS: 0
SSS: 0
ST Depression (mm): 0 mm
Stress Nuclear Isotope Dose: 31.3 mCi
TID: 0.99

## 2020-11-14 MED ORDER — TECHNETIUM TC 99M TETROFOSMIN IV KIT
10.2000 | PACK | Freq: Once | INTRAVENOUS | Status: AC | PRN
  Administered 2020-11-14: 10.2 via INTRAVENOUS
  Filled 2020-11-14: qty 11

## 2020-11-14 MED ORDER — TECHNETIUM TC 99M TETROFOSMIN IV KIT
31.3000 | PACK | Freq: Once | INTRAVENOUS | Status: AC | PRN
  Administered 2020-11-14: 31.3 via INTRAVENOUS
  Filled 2020-11-14: qty 32

## 2020-11-14 MED ORDER — REGADENOSON 0.4 MG/5ML IV SOLN
0.4000 mg | Freq: Once | INTRAVENOUS | Status: AC
  Administered 2020-11-14: 0.4 mg via INTRAVENOUS

## 2020-11-17 NOTE — Progress Notes (Unsigned)
Open encounter by error

## 2020-12-07 DIAGNOSIS — M25511 Pain in right shoulder: Secondary | ICD-10-CM | POA: Diagnosis not present

## 2021-01-10 DIAGNOSIS — L570 Actinic keratosis: Secondary | ICD-10-CM | POA: Diagnosis not present

## 2021-01-10 DIAGNOSIS — D0461 Carcinoma in situ of skin of right upper limb, including shoulder: Secondary | ICD-10-CM | POA: Diagnosis not present

## 2021-01-10 DIAGNOSIS — L814 Other melanin hyperpigmentation: Secondary | ICD-10-CM | POA: Diagnosis not present

## 2021-01-10 DIAGNOSIS — D485 Neoplasm of uncertain behavior of skin: Secondary | ICD-10-CM | POA: Diagnosis not present

## 2021-01-10 DIAGNOSIS — Z85828 Personal history of other malignant neoplasm of skin: Secondary | ICD-10-CM | POA: Diagnosis not present

## 2021-01-10 DIAGNOSIS — L821 Other seborrheic keratosis: Secondary | ICD-10-CM | POA: Diagnosis not present

## 2021-01-10 DIAGNOSIS — L578 Other skin changes due to chronic exposure to nonionizing radiation: Secondary | ICD-10-CM | POA: Diagnosis not present

## 2021-02-21 DIAGNOSIS — M79671 Pain in right foot: Secondary | ICD-10-CM | POA: Diagnosis not present

## 2021-02-21 DIAGNOSIS — M722 Plantar fascial fibromatosis: Secondary | ICD-10-CM | POA: Diagnosis not present

## 2021-02-24 DIAGNOSIS — M79671 Pain in right foot: Secondary | ICD-10-CM | POA: Diagnosis not present

## 2021-02-28 ENCOUNTER — Other Ambulatory Visit: Payer: Self-pay | Admitting: Cardiology

## 2021-03-10 DIAGNOSIS — M79671 Pain in right foot: Secondary | ICD-10-CM | POA: Diagnosis not present

## 2021-03-23 DIAGNOSIS — U071 COVID-19: Secondary | ICD-10-CM | POA: Diagnosis not present

## 2021-03-29 DIAGNOSIS — C61 Malignant neoplasm of prostate: Secondary | ICD-10-CM | POA: Diagnosis not present

## 2021-04-05 DIAGNOSIS — N5201 Erectile dysfunction due to arterial insufficiency: Secondary | ICD-10-CM | POA: Diagnosis not present

## 2021-04-05 DIAGNOSIS — C61 Malignant neoplasm of prostate: Secondary | ICD-10-CM | POA: Diagnosis not present

## 2021-04-05 DIAGNOSIS — R3121 Asymptomatic microscopic hematuria: Secondary | ICD-10-CM | POA: Diagnosis not present

## 2021-04-05 DIAGNOSIS — N393 Stress incontinence (female) (male): Secondary | ICD-10-CM | POA: Diagnosis not present

## 2021-04-14 IMAGING — CR DG SCAPULA*R*
2 series · 2 of 2 positions shown · non-contrast
Comparison: Whole body nuclear scintigraphic bone scan, 09/08/2018,
chest radiograph, 09/21/2010

CLINICAL DATA: History of prostate cancer, abnormal findings of
whole-body scintigraphic bone scan

EXAM:
CHEST - 2 VIEW; RIGHT SCAPULA - 2+ VIEWS

[w scapula ap/pa right * (1 of 2)]
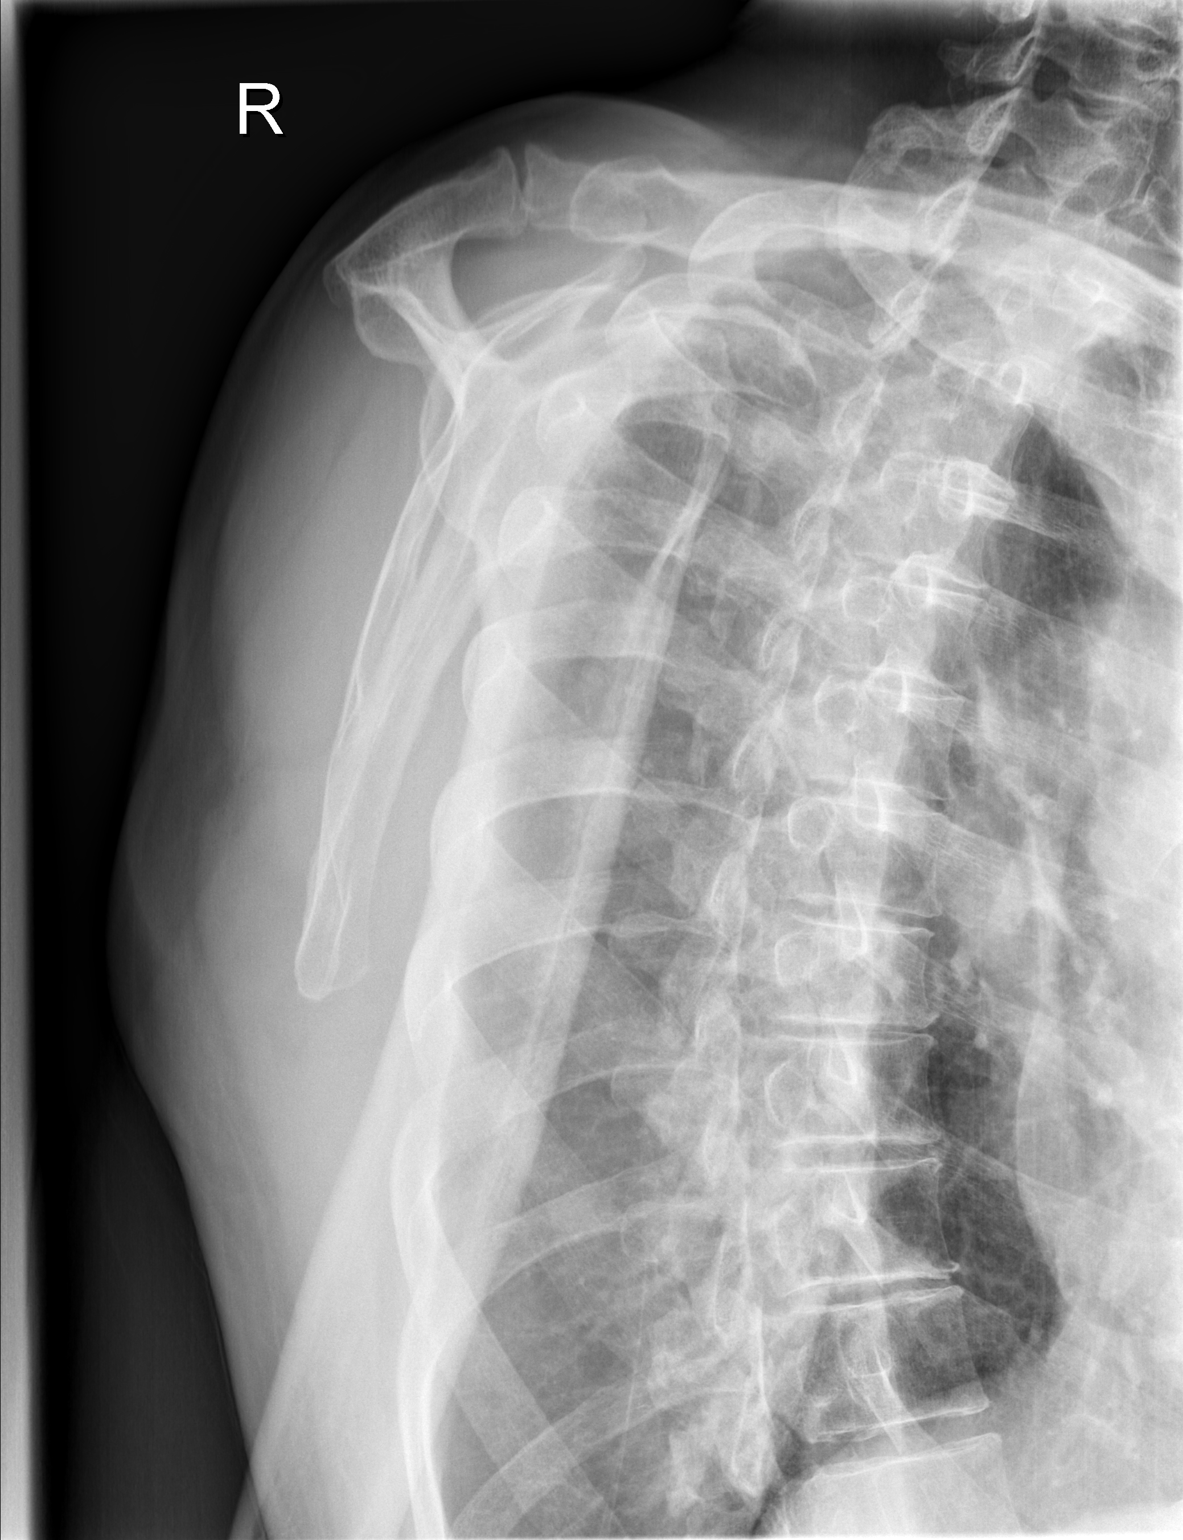

[w scapula ap/pa right * (2 of 2)]
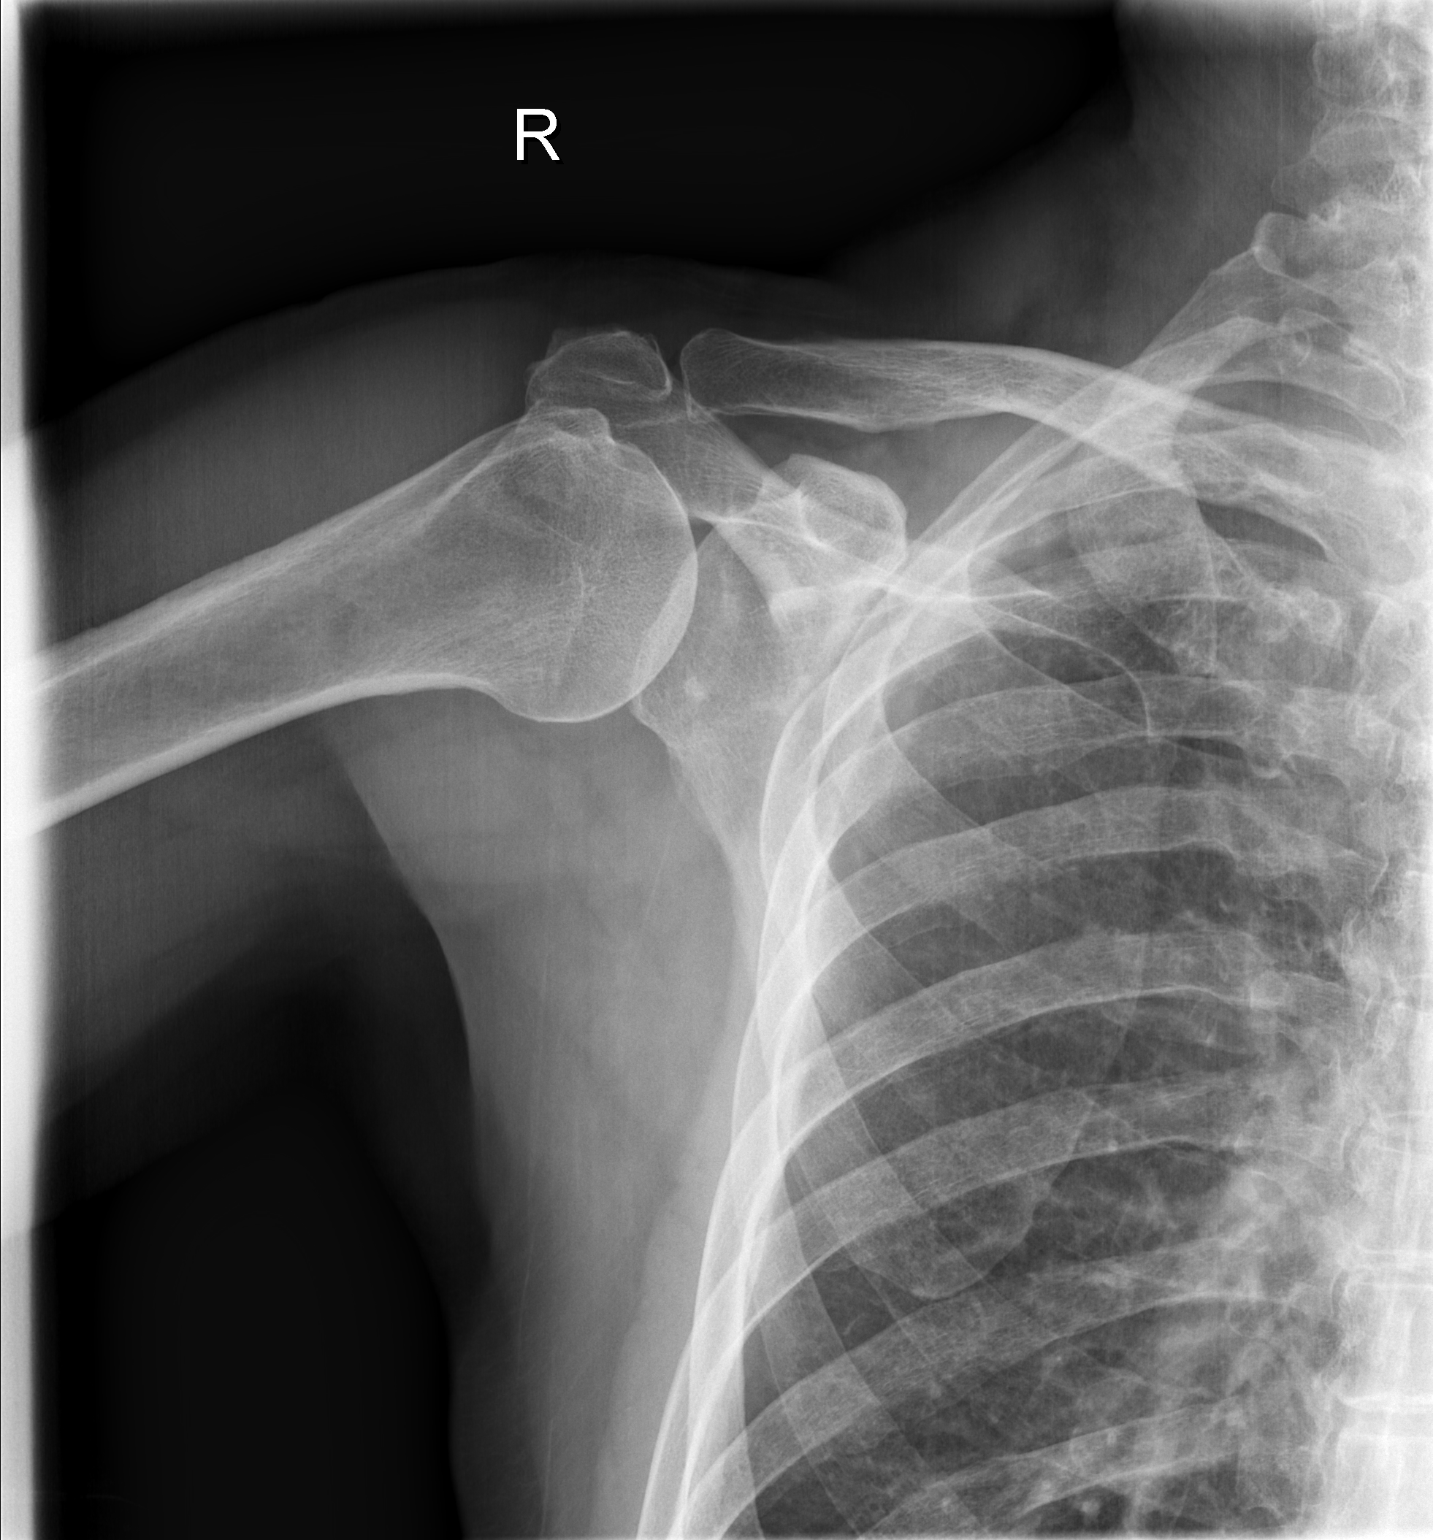

[2 of 2 positions shown; findings below may reference images not displayed]

FINDINGS: Status post median sternotomy and CABG. Both lungs are clear. The
visualized skeletal structures are unremarkable.

No fracture or dislocation of the right scapula. There is no lytic
lesion, cortical disruption or other obvious abnormality of the
scapula with particular attention to the scapular tip. Included
shoulder is unremarkable.
IMPRESSION: 1.  No acute abnormality of the lungs.

2. No fracture or dislocation of the right scapula. There is no
lytic lesion, cortical disruption or other obvious abnormality of
the scapula with particular attention to the scapular tip. CT or MRI
may be used to most sensitively evaluate for osseous metastatic
disease if there is high clinical concern for metastatic prostate
malignancy, however isolated disease of the scapula would be an
unusual presentation of osseous metastatic disease.

## 2021-05-18 DIAGNOSIS — C61 Malignant neoplasm of prostate: Secondary | ICD-10-CM | POA: Diagnosis not present

## 2021-06-07 DIAGNOSIS — L237 Allergic contact dermatitis due to plants, except food: Secondary | ICD-10-CM | POA: Diagnosis not present

## 2021-06-07 DIAGNOSIS — D1801 Hemangioma of skin and subcutaneous tissue: Secondary | ICD-10-CM | POA: Diagnosis not present

## 2021-06-07 DIAGNOSIS — L570 Actinic keratosis: Secondary | ICD-10-CM | POA: Diagnosis not present

## 2021-06-07 DIAGNOSIS — L309 Dermatitis, unspecified: Secondary | ICD-10-CM | POA: Diagnosis not present

## 2021-06-07 DIAGNOSIS — Z85828 Personal history of other malignant neoplasm of skin: Secondary | ICD-10-CM | POA: Diagnosis not present

## 2021-09-13 DIAGNOSIS — M79642 Pain in left hand: Secondary | ICD-10-CM | POA: Diagnosis not present

## 2021-09-21 DIAGNOSIS — E7800 Pure hypercholesterolemia, unspecified: Secondary | ICD-10-CM | POA: Diagnosis not present

## 2021-09-21 DIAGNOSIS — Z125 Encounter for screening for malignant neoplasm of prostate: Secondary | ICD-10-CM | POA: Diagnosis not present

## 2021-09-21 DIAGNOSIS — I10 Essential (primary) hypertension: Secondary | ICD-10-CM | POA: Diagnosis not present

## 2021-09-25 DIAGNOSIS — G5602 Carpal tunnel syndrome, left upper limb: Secondary | ICD-10-CM | POA: Diagnosis not present

## 2021-09-25 DIAGNOSIS — G5622 Lesion of ulnar nerve, left upper limb: Secondary | ICD-10-CM | POA: Diagnosis not present

## 2021-09-26 DIAGNOSIS — R3129 Other microscopic hematuria: Secondary | ICD-10-CM | POA: Diagnosis not present

## 2021-09-26 DIAGNOSIS — Z0000 Encounter for general adult medical examination without abnormal findings: Secondary | ICD-10-CM | POA: Diagnosis not present

## 2021-09-26 DIAGNOSIS — Z8546 Personal history of malignant neoplasm of prostate: Secondary | ICD-10-CM | POA: Diagnosis not present

## 2021-09-26 DIAGNOSIS — M8580 Other specified disorders of bone density and structure, unspecified site: Secondary | ICD-10-CM | POA: Diagnosis not present

## 2021-09-26 DIAGNOSIS — J387 Other diseases of larynx: Secondary | ICD-10-CM | POA: Diagnosis not present

## 2021-09-26 DIAGNOSIS — I2510 Atherosclerotic heart disease of native coronary artery without angina pectoris: Secondary | ICD-10-CM | POA: Diagnosis not present

## 2021-09-26 DIAGNOSIS — I10 Essential (primary) hypertension: Secondary | ICD-10-CM | POA: Diagnosis not present

## 2021-09-26 DIAGNOSIS — E291 Testicular hypofunction: Secondary | ICD-10-CM | POA: Diagnosis not present

## 2021-09-26 DIAGNOSIS — N529 Male erectile dysfunction, unspecified: Secondary | ICD-10-CM | POA: Diagnosis not present

## 2021-09-26 DIAGNOSIS — J309 Allergic rhinitis, unspecified: Secondary | ICD-10-CM | POA: Diagnosis not present

## 2021-09-26 DIAGNOSIS — Z23 Encounter for immunization: Secondary | ICD-10-CM | POA: Diagnosis not present

## 2021-09-26 DIAGNOSIS — L719 Rosacea, unspecified: Secondary | ICD-10-CM | POA: Diagnosis not present

## 2021-09-28 DIAGNOSIS — G5622 Lesion of ulnar nerve, left upper limb: Secondary | ICD-10-CM | POA: Diagnosis not present

## 2021-09-28 DIAGNOSIS — M1812 Unilateral primary osteoarthritis of first carpometacarpal joint, left hand: Secondary | ICD-10-CM | POA: Diagnosis not present

## 2021-09-28 DIAGNOSIS — G5603 Carpal tunnel syndrome, bilateral upper limbs: Secondary | ICD-10-CM | POA: Diagnosis not present

## 2021-10-12 DIAGNOSIS — Z23 Encounter for immunization: Secondary | ICD-10-CM | POA: Diagnosis not present

## 2021-10-20 DIAGNOSIS — C61 Malignant neoplasm of prostate: Secondary | ICD-10-CM | POA: Diagnosis not present

## 2021-10-27 DIAGNOSIS — R3121 Asymptomatic microscopic hematuria: Secondary | ICD-10-CM | POA: Diagnosis not present

## 2021-10-27 DIAGNOSIS — C61 Malignant neoplasm of prostate: Secondary | ICD-10-CM | POA: Diagnosis not present

## 2021-10-27 DIAGNOSIS — N5201 Erectile dysfunction due to arterial insufficiency: Secondary | ICD-10-CM | POA: Diagnosis not present

## 2021-10-27 DIAGNOSIS — N393 Stress incontinence (female) (male): Secondary | ICD-10-CM | POA: Diagnosis not present

## 2021-12-25 ENCOUNTER — Ambulatory Visit: Payer: Medicare Other | Attending: Cardiology | Admitting: Cardiology

## 2021-12-25 ENCOUNTER — Encounter: Payer: Self-pay | Admitting: Cardiology

## 2021-12-25 VITALS — BP 140/68 | HR 65 | Ht 70.0 in | Wt 178.0 lb

## 2021-12-25 DIAGNOSIS — Z951 Presence of aortocoronary bypass graft: Secondary | ICD-10-CM | POA: Insufficient documentation

## 2021-12-25 DIAGNOSIS — I10 Essential (primary) hypertension: Secondary | ICD-10-CM | POA: Diagnosis not present

## 2021-12-25 DIAGNOSIS — E785 Hyperlipidemia, unspecified: Secondary | ICD-10-CM | POA: Insufficient documentation

## 2021-12-25 DIAGNOSIS — I2510 Atherosclerotic heart disease of native coronary artery without angina pectoris: Secondary | ICD-10-CM | POA: Diagnosis not present

## 2021-12-25 DIAGNOSIS — R42 Dizziness and giddiness: Secondary | ICD-10-CM | POA: Insufficient documentation

## 2021-12-25 NOTE — Patient Instructions (Addendum)

## 2021-12-25 NOTE — Progress Notes (Signed)
Primary Care Provider: Deland Pretty, [place] Cardiologist: Glenetta Hew, MD Electrophysiologist: None  Clinic Note: Chief Complaint  Patient presents with   Follow-up    12 months.(14 months)    Hospitalization Follow-up    ER Visit Oct 23    Coronary Artery Disease    No angina. 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  Unlikely that he would have recurrent false negative nuclear stress test since the left main and PDA disease should have been bypassed.  1 would expect there to be ischemia in circumflex distribution. Myoview in 2013, 2017 and 2022 were all nonischemic.  Plan: No changes. On stable dose of Lopressor 25 mg twice daily at Hyzaar 100-12.5 mg daily. Tolerating 5 mg of rosuvastatin with lipids followed by PCP.  I do not have the most recent complete send lipids to review.  (Pretty much at goal last September 2022 Not listed, but he is taking 81 mg aspirin.       Hypertension - labile (Chronic)    He has episodes of blood pressure, today is a little high, but this morning he had the blood pressure reading of 122 / 66 mmHg.  On my recheck it was 134 / 64 mmHg.  Will continue current dose of Hyzaar and Lopressor.      Coronary artery disease involving native coronary artery of native heart without angina pectoris - Primary (Chronic)   Relevant Orders   EKG 12-Lead (Completed)     Other   Dyslipidemia (high LDL; low HDL) (Chronic)    Intolerant of higher doses of statin.  Intolerant of Zetia.  Lipids did not look bad in September 2022 when checked by PCP on 5 mg rosuvastatin.  Unfortunately, I do not have the complete panel  from this September.  Total cholesterol was little higher as was the triglycerides ==> If not able to achieve target LDL, we may want to consider PCSK9-I (or even potentially Inclisarin).      Episode of dizziness    A really downplayed this episode for which she went to the ER.  He had not been eating and drinking well, his allergies were acting up he just did not feel well.  Despite that he got dressed up to go to church and felt nauseated and queasy.  Did not have any chest pain and has been doing fine since then.  I stressed the importance of staying adequately hydrated and trying to work on clearing up his allergies.      S/P CABG x 4 (Chronic)    CABG in August 28, 2010-most recent Myoview in November 2022 nonischemic.      Relevant Orders   EKG 12-Lead (Completed)    ===================================  HPI:    [name] is a 76 y.o. male with a PMH below who presents today for delayed annual follow-up as well as hospital follow-up. He follows up with at the request of Deland Pretty, MD.  08/2010: Abn EKG -> Abn GXT -> Referred to Cardiology Cath: MV CAD => Referred for CABGx4 (LIMA-LAD, SVG-Diag, SVG-OM, SVG-rPDA) Myoview 11/2020: No Ischemia or Infarction. EF 65-70%. LOW RISK. NORMAL Labile HTN HLD -  on low dose Rosuvastatin; did not tolerate Zetia  [name] was last seen on October 18, 2020: No major issues.  Not as mobile because of osteoarthritis.  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  Not able to get as much exercise.  Therefore not as much stress relief.  Intermittent dizziness but no palpitations. Surveillance Myoview ordered  Recent Hospitalizations:  [facility] 10/30/2020:  15-minute episode of dizziness cold sweats and heart racing.  This is a charge.  Back to baseline upon arrival to ER.  No chest pain or pressure.  Ruled out for ACS.[name]  Reviewed  CV studies:    The following studies were reviewed today: (if  available, images/films reviewed: From Epic Chart or Care Everywhere) Myoview 11/2020: EF 65 to 70%.  No ischemia or infarction. NORMAL  Interval History:   [name] returns today accompanied by his wife.  He still notes that he is active and walking routinely but not really going to the gym as much used to.  He is try to keep up with exercise walking either in the park or around the neighborhood.  He does not do the weights or, but is still trying to stay active and physical.  Not having any episodes of chest pain or pressure.  No syncope or near syncope although he did have an episode of dizziness that lasted about 15 minutes while at church.  As far as an episode goes, he acknowledges that he had not had much for breakfast and started feel the lightheaded and clammy.  He had little nausea but no irregular heartbeats palpitations.  He never passed out. Recently, he is having issues with allergies steps of chest wall tightness when he takes deep breath.  This is led to little dyspnea and exertional intolerance, but as long as his allergies clear up, he is doing fine.  CV Review of Symptoms (Summary):  no chest pain or dyspnea on exertion positive for - chest wall tightness, dyspnea  related to allergies negative for - edema, orthopnea, palpitations, paroxysmal nocturnal dyspnea, rapid heart rate, shortness of breath, or syncope,near syncope, TIA/amaurosis fugax   REVIEWED OF SYSTEMS   Review of Systems  Constitutional:  Negative for malaise/fatigue (Just a little bit of exercise intolerance, not as active as he once was.) and weight loss.  HENT:  Positive for congestion (Allergies). Negative for nosebleeds.   Respiratory:  Positive for shortness of breath and wheezing. Negative for cough.        Associated with allergies.  Gastrointestinal:  Negative for blood in stool and melena.  Genitourinary:  Negative for hematuria.  Musculoskeletal:  Positive for joint pain (To use musculoskeletal  aches and pains limits his exercise level.).  Neurological:  Positive for dizziness (No further dizziness spells like he had back in TAVR.). Negative for focal weakness and weakness.  Psychiatric/Behavioral: Negative.         Still has social stress, but less significant.   I have reviewed and (if needed) personally updated the patient's problem list, medications, allergies, past medical and surgical history, social and family history.   PAST MEDICAL HISTORY   Past Medical History:  Diagnosis Date   Acquired trigger finger of both middle fingers    Acquired trigger finger of both ring fingers    Arthritis    CAD in native artery 08/2010   Cardiac CATH for Abn EKG- GXT portio of Myoview: Diffuse market Inferior ST depressions & STE in V1  and V2; NO scintigraphic evidence of ischemia  => CATH (MV CAD): 70% d LM, 90% ost LAD & 60-70% pLAD; Minimal LCx & p-d RCA but 99% ostrPDA ( BALANCED ISCHEMIA) => CABGx4 (LIMA-LAD, SVG-DiaG, SVG-OM, SVG-rPDA; Myoivew 11/2020 - EF 65-70%, No Ischemia/Infarction.   Cancer (HCC)    skin cancers-squamous cell / facial    Dyslipidemia - low HDL    Lipids monitored by Dr. Shelia Media.   GERD (gastroesophageal reflux disease)    History of multiple allergies    History of vertigo    Hypertension    Labile   Low testosterone    Recently stopped replacement therapy.   Pneumonia    Post-nasal drip    Prostate cancer ([place])    S/P CABG x 4 08/2010   LIMA-LAD, SVG-PDA, SVG to OM, SVG-D1.   Urinary incontinence, urge    Wears glasses     PAST SURGICAL HISTORY   Past Surgical History:  Procedure Laterality Date   arthroscopy of knees bilat      CARDIAC CATHETERIZATION  08/18/2010   Terminal left main 70%, ostial LAD 90%, sequential 60-70% lesion in the proximal LAD, mild circumflex lesions, but large OM1 and OM2. RCA had moderate irregularities and a subtotal proximal PDA   CARDIOVASCULAR STRESS TEST  02/06/2011   EKG negative for ischemia. No significant wall  motion abnormalities noted.   CATARACT EXTRACTION, BILATERAL     CHOLECYSTECTOMY N/A 07/29/2015   Procedure: LAPAROSCOPIC CHOLECYSTECTOMY WITH INTRAOPERATIVE CHOLANGIOGRAM;  Surgeon: Jackolyn Confer, MD;  Location: [facility];  Service: General;  Laterality: N/A;   COLONOSCOPY     CORONARY ARTERY BYPASS GRAFT  08/29/2010   x4. LIMA to LAD, SVG to diag, SVG to OM, SVG to PDA. Rt thigh vein harvest   LYMPHADENECTOMY Bilateral 10/13/2018   Procedure: LYMPHADENECTOMY, PELVIC;  Surgeon: Raynelle Bring, MD;  Location: [facility];  Service: Urology;  Laterality: Bilateral;   MOHS SURGERY     Face and Neck Left eyelid   NM MYOVIEW LTD  11/2020   No Ischemia or Infarction. EF 65-70%. LOW RISK. NORMAL   PRE CABG DOPPLER  08/25/2010   No significant extracranial carotid artery stenosis. ECA stenosis noted bilaterally - ICA/CCA ratio id 0.9-rt and 0.74-lft   ROBOT ASSISTED LAPAROSCOPIC RADICAL PROSTATECTOMY N/A 10/13/2018   Procedure: XI ROBOTIC ASSISTED LAPAROSCOPIC RADICAL PROSTATECTOMY LEVEL 2;  Surgeon: Raynelle Bring, MD;  Location: [facility];  Service: Urology;  Laterality: N/A;   SURGERY SCROTAL / TESTICULAR     Benign nodule   TONSILLECTOMY     TRANSTHORACIC ECHOCARDIOGRAM  07/27/2010   EF >55%, mild concentric LVH, stage 1 diastolic dysfunction    Immunization History  Administered Date(s) Administered   PFIZER(Purple Top)SARS-COV-2 Vaccination 03/11/2019, 04/01/2019    MEDICATIONS/ALLERGIES   Current Meds  Medication Sig   acetaminophen (TYLENOL) 325 MG tablet Take 650 mg by mouth daily as needed for moderate pain or headache. Take 2  Daily as needed    Calcium Carbonate-Vitamin D 600-400 MG-UNIT tablet Place 600 mg inside cheek daily.   diphenhydrAMINE (BENADRYL) 25 MG tablet Take 25 mg by mouth daily as needed for allergies.    doxycycline (VIBRAMYCIN) 100 MG capsule Take 100 mg by mouth daily as needed (rosacea).    fexofenadine (ALLEGRA) 60 MG tablet Take 60 mg by mouth daily.    ipratropium (ATROVENT) 0.06 % nasal spray SMARTSIG:2 Spray(s) Both Nares 3 Times Daily PRN   losartan-hydrochlorothiazide (HYZAAR) 100-12.5 MG tablet Take 1 tablet by mouth daily.  metoprolol tartrate (LOPRESSOR) 25 MG tablet TAKE 1 TABLET TWICE A DAY   pantoprazole (PROTONIX) 40 MG tablet Take 40 mg by mouth daily.     rosuvastatin (CRESTOR) 5 MG tablet Take 5 mg by mouth at bedtime. Pt takes 5 mg daily at bedtime   sildenafil (REVATIO) 20 MG tablet Take 20 mg by mouth daily as needed.    traMADol (ULTRAM) 50 MG tablet Take 1-2 tablets (50-100 mg total) by mouth every 6 (six) hours as needed for moderate pain.    Allergies  Allergen Reactions   Cephalosporins Palpitations   Triamterene-Hctz Palpitations   Statins Other (See Comments)    " elevated muscle enzymes" Lipitor   Vioxx [Rofecoxib] Swelling    Facial swelling   Zetia [Ezetimibe]     Increased muscle enzymes; GI upset   Lisinopril Cough   Sulfa Antibiotics Rash    SOCIAL HISTORY/FAMILY HISTORY   Reviewed in Epic:  Pertinent findings:  Social History   Tobacco Use   Smoking status: Never   Smokeless tobacco: Former    Types: Chew    Quit date: 01/08/2005  Vaping Use   Vaping Use: Never used  Substance Use Topics   Alcohol use: No   Drug use: No   Social History   Social History Narrative   He is a married father of 2.   He does not smoke or drink alcohol.   Up until recently when he hurt his left knee, he was continuing to exercise vigorously at least 2-3 times a week sometimes up to 2 hours of time doing cardiovascular and muscle strength exercises.    OBJCTIVE -PE, EKG, labs   Wt Readings from Last 3 Encounters:  12/25/21 178 lb (80.7 kg)  11/14/20 179 lb (81.2 kg)  10/18/20 179 lb 6.4 oz (81.4 kg)    Physical Exam: BP (!) 140/68 (BP Location: Right Arm, Patient Position: Sitting, Cuff Size: Normal)   Pulse 65   Ht '5\' 10"'$  (1.778 m)   Wt 178 lb (80.7 kg)   BMI 25.54 kg/m  Physical Exam Vitals  (Repeat BP check 134/64 mmHg) reviewed.  Constitutional:      General: He is not in acute distress.    Appearance: Normal appearance. He is normal weight. He is not ill-appearing (Healthy-appearing.  Well-nourished well-groomed.) or toxic-appearing.  HENT:     Head: Normocephalic and atraumatic.  Neck:     Vascular: No carotid bruit or JVD.  Cardiovascular:     Rate and Rhythm: Normal rate and regular rhythm. No extrasystoles are present.    Chest Wall: PMI is not displaced.     Pulses: Normal pulses and intact distal pulses.     Heart sounds: S1 normal and S2 normal. No murmur heard.    No friction rub. No gallop.  Pulmonary:     Effort: Pulmonary effort is normal. No respiratory distress.     Breath sounds: Normal breath sounds. No wheezing, rhonchi or rales.  Chest:     Chest wall: Tenderness (Mild costosternal tenderness) present.  Musculoskeletal:        General: No swelling. Normal range of motion.     Cervical back: Normal range of motion and neck supple.  Skin:    General: Skin is warm and dry.  Neurological:     General: No focal deficit present.     Mental Status: He is alert and oriented to person, place, and time. Mental status is at baseline.     Cranial Nerves: No  cranial nerve deficit.     Gait: Gait normal.  Psychiatric:        Mood and Affect: Mood normal.        Behavior: Behavior normal.        Thought Content: Thought content normal.        Judgment: Judgment normal.     Adult ECG Report  Rate: 65;  Rhythm: normal sinus rhythm and incomplete RBBB.  T wave abnormalities in lateral leads.  Vascular ischemia. ;   Narrative Interpretation: Stable  Recent Labs:  reviewed  Scanned labs from Sept /2022 showed LDL 69 with TC of 152 and TG of 184.  Lab Results  Component Value Date   CREATININE 1.05 10/09/2018   BUN 21 10/09/2018   NA 135 10/09/2018   K 4.0 10/09/2018   CL 99 10/09/2018   CO2 27 10/09/2018      Latest Ref Rng & Units 10/14/2018    5:30  AM 10/13/2018    3:31 PM 10/09/2018    2:52 PM  CBC  WBC 4.0 - 10.5 K/uL   5.3   Hemoglobin 13.0 - 17.0 g/dL 13.4  14.4  15.0   Hematocrit 39.0 - 52.0 % 39.5  42.0  44.4   Platelets 150 - 400 K/uL   224     Lab Results  Component Value Date   HGBA1C 5.8 (H) 08/25/2010   No results found for: "TSH"  ================================================== I spent a total of 24 minutes with the patient spent in direct patient consultation.  Additional time spent with chart review  / charting (studies, outside notes, etc): 14 min Total Time: 38 min  Current medicines are reviewed at length with the patient today.  (+/- concerns) n/a  Notice: This dictation was prepared with Dragon dictation along with smart phrase technology. Any transcriptional errors that result from this process are unintentional and may not be corrected upon review.  Studies Ordered:   Orders Placed This Encounter  Procedures   EKG 12-Lead   No orders of the defined types were placed in this encounter.   Patient Instructions / Medication Changes & Studies & Tests Ordered   Patient Instructions  Medication Instructions:   No changes  *If you need a refill on your cardiac medications before your next appointment, please call your pharmacy*   Lab Work:  Not needed   Testing/Procedures: Not needed   Follow-Up: At [facility], you and your health needs are our priority.  As part of our continuing mission to provide you with exceptional heart care, we have created designated Provider Care Teams.  These Care Teams include your primary Cardiologist (physician) and Advanced Practice Providers (APPs -  Physician Assistants and Nurse Practitioners) who all work together to provide you with the care you need, when you need it.     Your next appointment:   12 month(s)  The format for your next appointment:   In Person  Provider:   Glenetta Hew, MD        Leonie Man, MD, MS Glenetta Hew,  M.D., M.S. Interventional Cardiologist  [place]  Pager # [phone] Phone # [phone] [address] [place],  [address]   Thank you for choosing [facility] [place]!!

## 2022-01-11 ENCOUNTER — Encounter: Payer: Self-pay | Admitting: Cardiology

## 2022-01-11 DIAGNOSIS — R42 Dizziness and giddiness: Secondary | ICD-10-CM | POA: Insufficient documentation

## 2022-01-11 NOTE — Assessment & Plan Note (Signed)
CABG in August 28, 2010-most recent Myoview in November 2022 nonischemic.

## 2022-01-11 NOTE — Assessment & Plan Note (Signed)
A really downplayed this episode for which she went to the ER.  He had not been eating and drinking well, his allergies were acting up he just did not feel well.  Despite that he got dressed up to go to church and felt nauseated and queasy.  Did not have any chest pain and has been doing fine since then.  I stressed the importance of staying adequately hydrated and trying to work on clearing up his allergies.

## 2022-01-11 NOTE — Assessment & Plan Note (Addendum)
He has episodes of blood pressure, today is a little high, but this morning he had the blood pressure reading of 122 / 66 mmHg.  On my recheck it was 134 / 64 mmHg.  Will continue current dose of Hyzaar and Lopressor.

## 2022-01-11 NOTE — Assessment & Plan Note (Signed)
He never really did have any anginal symptoms despite having a grossly abnormal stress test and cardiac cath finding.  Unlikely that he would have recurrent false negative nuclear stress test since the left main and PDA disease should have been bypassed.  1 would expect there to be ischemia in circumflex distribution. Myoview in 2013, 2017 and 2022 were all nonischemic.  Plan: No changes. On stable dose of Lopressor 25 mg twice daily at Hyzaar 100-12.5 mg daily. Tolerating 5 mg of rosuvastatin with lipids followed by PCP.  I do not have the most recent complete send lipids to review.  (Pretty much at goal last September 2022 Not listed, but he is taking 81 mg aspirin.

## 2022-01-11 NOTE — Assessment & Plan Note (Signed)
Intolerant of higher doses of statin.  Intolerant of Zetia.  Lipids did not look bad in September 2022 when checked by PCP on 5 mg rosuvastatin.  Unfortunately, I do not have the complete panel from this September.  Total cholesterol was little higher as was the triglycerides ==> If not able to achieve target LDL, we may want to consider PCSK9-I (or even potentially Inclisarin).

## 2022-01-15 DIAGNOSIS — L814 Other melanin hyperpigmentation: Secondary | ICD-10-CM | POA: Diagnosis not present

## 2022-01-15 DIAGNOSIS — Z85828 Personal history of other malignant neoplasm of skin: Secondary | ICD-10-CM | POA: Diagnosis not present

## 2022-01-15 DIAGNOSIS — L308 Other specified dermatitis: Secondary | ICD-10-CM | POA: Diagnosis not present

## 2022-01-15 DIAGNOSIS — D1801 Hemangioma of skin and subcutaneous tissue: Secondary | ICD-10-CM | POA: Diagnosis not present

## 2022-01-15 DIAGNOSIS — L821 Other seborrheic keratosis: Secondary | ICD-10-CM | POA: Diagnosis not present

## 2022-01-15 DIAGNOSIS — L570 Actinic keratosis: Secondary | ICD-10-CM | POA: Diagnosis not present

## 2022-03-14 ENCOUNTER — Other Ambulatory Visit: Payer: Self-pay | Admitting: Cardiology

## 2022-04-25 DIAGNOSIS — C61 Malignant neoplasm of prostate: Secondary | ICD-10-CM | POA: Diagnosis not present

## 2022-05-02 DIAGNOSIS — N5201 Erectile dysfunction due to arterial insufficiency: Secondary | ICD-10-CM | POA: Diagnosis not present

## 2022-05-02 DIAGNOSIS — C61 Malignant neoplasm of prostate: Secondary | ICD-10-CM | POA: Diagnosis not present

## 2022-05-02 DIAGNOSIS — N3946 Mixed incontinence: Secondary | ICD-10-CM | POA: Diagnosis not present

## 2022-10-09 DIAGNOSIS — Z23 Encounter for immunization: Secondary | ICD-10-CM | POA: Diagnosis not present

## 2022-10-09 DIAGNOSIS — E7800 Pure hypercholesterolemia, unspecified: Secondary | ICD-10-CM | POA: Diagnosis not present

## 2022-10-09 DIAGNOSIS — Z125 Encounter for screening for malignant neoplasm of prostate: Secondary | ICD-10-CM | POA: Diagnosis not present

## 2022-10-09 LAB — LAB REPORT - SCANNED: EGFR: 56

## 2022-10-16 DIAGNOSIS — J309 Allergic rhinitis, unspecified: Secondary | ICD-10-CM | POA: Diagnosis not present

## 2022-10-16 DIAGNOSIS — J387 Other diseases of larynx: Secondary | ICD-10-CM | POA: Diagnosis not present

## 2022-10-16 DIAGNOSIS — Z951 Presence of aortocoronary bypass graft: Secondary | ICD-10-CM | POA: Diagnosis not present

## 2022-10-16 DIAGNOSIS — M8580 Other specified disorders of bone density and structure, unspecified site: Secondary | ICD-10-CM | POA: Diagnosis not present

## 2022-10-16 DIAGNOSIS — N529 Male erectile dysfunction, unspecified: Secondary | ICD-10-CM | POA: Diagnosis not present

## 2022-10-16 DIAGNOSIS — I2510 Atherosclerotic heart disease of native coronary artery without angina pectoris: Secondary | ICD-10-CM | POA: Diagnosis not present

## 2022-10-16 DIAGNOSIS — Z0000 Encounter for general adult medical examination without abnormal findings: Secondary | ICD-10-CM | POA: Diagnosis not present

## 2022-10-16 DIAGNOSIS — N1831 Chronic kidney disease, stage 3a: Secondary | ICD-10-CM | POA: Diagnosis not present

## 2022-10-16 DIAGNOSIS — I10 Essential (primary) hypertension: Secondary | ICD-10-CM | POA: Diagnosis not present

## 2022-10-16 DIAGNOSIS — C61 Malignant neoplasm of prostate: Secondary | ICD-10-CM | POA: Diagnosis not present

## 2022-10-17 ENCOUNTER — Encounter: Payer: Self-pay | Admitting: Internal Medicine

## 2022-11-22 DIAGNOSIS — C61 Malignant neoplasm of prostate: Secondary | ICD-10-CM | POA: Diagnosis not present

## 2022-11-23 DIAGNOSIS — C61 Malignant neoplasm of prostate: Secondary | ICD-10-CM | POA: Diagnosis not present

## 2022-11-23 DIAGNOSIS — R3121 Asymptomatic microscopic hematuria: Secondary | ICD-10-CM | POA: Diagnosis not present

## 2022-12-18 NOTE — Progress Notes (Signed)
Cardiology Clinic Note   Patient Name: [name] Date of Encounter: 12/25/2022  Primary Care Provider:  Merri Brunette, MD Primary Cardiologist:  Bryan Lemma, MD  Patient Profile    [name] 77 year old male presents to the clinic today for follow-up evaluation of his coronary artery disease and hypertension.  Past Medical History    Past Medical History:  Diagnosis Date   Acquired trigger finger of both middle fingers    Acquired trigger finger of both ring fingers    Arthritis    CAD in native artery 08/2010   Cardiac CATH for Abn EKG- GXT portio of Myoview: Diffuse market Inferior ST depressions & STE in V1 and V2; NO scintigraphic evidence of ischemia  => CATH (MV CAD): 70% d LM, 90% ost LAD & 60-70% pLAD; Minimal LCx & p-d RCA but 99% ostrPDA ( BALANCED ISCHEMIA) => CABGx4 (LIMA-LAD, SVG-DiaG, SVG-OM, SVG-rPDA; Myoivew 11/2020 - EF 65-70%, No Ischemia/Infarction.   Cancer (HCC)    skin cancers-squamous cell / facial    Dyslipidemia - low HDL    Lipids monitored by Dr. Renne Crigler.   GERD (gastroesophageal reflux disease)    History of multiple allergies    History of vertigo    Hypertension    Labile   Low testosterone    Recently stopped replacement therapy.   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   Urinary incontinence, urge    Wears glasses    Past Surgical History:  Procedure Laterality Date   arthroscopy of knees bilat      CARDIAC CATHETERIZATION  08/18/2010   Terminal left main 70%, ostial LAD 90%, sequential 60-70% lesion in the proximal LAD, mild circumflex lesions, but large OM1 and OM2. RCA had moderate irregularities and a subtotal proximal PDA   CARDIOVASCULAR STRESS TEST  02/06/2011   EKG negative for ischemia. No significant wall motion abnormalities noted.   CATARACT EXTRACTION, BILATERAL     CHOLECYSTECTOMY N/A 07/29/2015   Procedure: LAPAROSCOPIC  CHOLECYSTECTOMY WITH INTRAOPERATIVE CHOLANGIOGRAM;  Surgeon: Avel Peace, MD;  Location: [facility];  Service: General;  Laterality: N/A;   COLONOSCOPY     CORONARY ARTERY BYPASS GRAFT  08/29/2010   x4. LIMA to LAD, SVG to diag, SVG to OM, SVG to PDA. Rt thigh vein harvest   LYMPHADENECTOMY Bilateral 10/13/2018   Procedure: LYMPHADENECTOMY, PELVIC;  Surgeon: Heloise Purpura, MD;  Location: [facility];  Service: Urology;  Laterality: Bilateral;   MOHS SURGERY     Face and Neck Left eyelid   NM MYOVIEW LTD  11/2020   No Ischemia or Infarction. EF 65-70%. LOW RISK. NORMAL   PRE CABG DOPPLER  08/25/2010   No significant extracranial carotid artery stenosis. ECA stenosis noted bilaterally - ICA/CCA ratio id 0.9-rt and 0.74-lft   ROBOT ASSISTED LAPAROSCOPIC RADICAL PROSTATECTOMY N/A 10/13/2018   Procedure: XI ROBOTIC ASSISTED LAPAROSCOPIC RADICAL PROSTATECTOMY LEVEL 2;  Surgeon: Heloise Purpura, MD;  Location: [facility];  Service: Urology;  Laterality: N/A;   SURGERY SCROTAL / TESTICULAR     Benign nodule   TONSILLECTOMY     TRANSTHORACIC ECHOCARDIOGRAM  07/27/2010   EF >55%, mild concentric LVH, stage 1 diastolic dysfunction    Allergies  Allergies  Allergen Reactions   Cephalosporins Palpitations   Triamterene-Hctz Palpitations   Statins Other (See Comments)    " elevated muscle enzymes" Lipitor   Vioxx [  Rofecoxib] Swelling    Facial swelling   Zetia [Ezetimibe]     Increased muscle enzymes; GI upset   Lisinopril Cough   Sulfa Antibiotics Rash    History of Present Illness    [name] is a PMH of labile hypertension, coronary artery disease status post CABG x 4 (2012), hyperlipidemia, prostate CA, and dizziness.  He had a normal nuclear stress test 11/22.  At that time his EF was noted to be 65-70%.  He was seen by Dr. Herbie Baltimore 10/22.  At that time he had no major issues.  He was limited in his mobility due to his osteoarthritis.  He was helping to care for his mother and  mother-in-law.  He was not able to do much exercise.  He did note intermittent dizziness but denied palpitations.  He presented to the emergency department 10/30/2020.  He had a 15-minute episode of dizziness, cold sweats, and heart racing.  He returned to his baseline upon arriving to the emergency department.  He denied chest pain or pressure.  He was ruled out for ACS.  Follow-up nuclear stress test was negative for ischemia.  He was seen in follow-up by Dr. Herbie Baltimore 12/25/2021.  During that time he was accompanied by his wife.  He was active walking.  He was trying to stay more physically active.  He denied episodes of chest pain and pressure.  He denied presyncope and syncope.  He did note a episode of dizziness while he was at church that lasted for around 15 minutes.  It was felt that this may be related to low blood sugar.  He had a little bit of nausea but denied irregular heartbeats.  He did not pass out.  He did also note some issues with allergies and chest wall tightness when he would take a deep breath.  He had slight dyspnea on exertion and exertional intolerance.  With allergies improving his breathing would improve.  He presents to the clinic today for follow-up evaluation and states he continues to be physically active using his chainsaw, doing yard work, and helping out around his church.  He feels that as he has gotten older he has lost some strength in his legs.  He continues to eat a heart healthy diet.  His cholesterol was noted to be 150 with an LDL cholesterol of 76 on last check from PCP office.  We reviewed his previous open heart surgery.  We reviewed his echocardiogram.  He and his wife expressed understanding.  His biggest complaint today is with sinus drainage.  He reports that he and his wife had a upper respiratory infection at the end of October/early November.  They required codeine cough medicine to get over the cough.  I will continue his current medication regimen, have him  increase his physical activity as tolerated and plan follow-up in 12 months.  Today he denies chest pain, shortness of breath, lower extremity edema, fatigue, palpitations, melena, hematuria, hemoptysis, diaphoresis, weakness, presyncope, syncope, orthopnea, and PND.   Home Medications    Prior to Admission medications   Medication Sig Start Date End Date Taking? Authorizing Provider  acetaminophen (TYLENOL) 325 MG tablet Take 650 mg by mouth daily as needed for moderate pain or headache. Take 2  Daily as needed     [provider]  diphenhydrAMINE (BENADRYL) 25 MG tablet Take 25 mg by mouth daily as needed for allergies.     [provider]  doxycycline (VIBRAMYCIN) 100 MG capsule Take 100  mg by mouth daily as needed (rosacea).     [provider]  fexofenadine (ALLEGRA) 60 MG tablet Take 60 mg by mouth daily.    [provider]  ipratropium (ATROVENT) 0.06 % nasal spray SMARTSIG:2 Spray(s) Both Nares 3 Times Daily PRN 08/21/19   [provider]  losartan-hydrochlorothiazide (HYZAAR) 100-12.5 MG tablet Take 1 tablet by mouth daily.    [provider]  metoprolol tartrate (LOPRESSOR) 25 MG tablet TAKE 1 TABLET TWICE A DAY 03/14/22   Marykay Lex, MD  pantoprazole (PROTONIX) 40 MG tablet Take 40 mg by mouth daily.      [provider]  rosuvastatin (CRESTOR) 5 MG tablet Take 5 mg by mouth at bedtime. Pt takes 5 mg daily at bedtime    [provider]  sildenafil (REVATIO) 20 MG tablet Take 20 mg by mouth daily as needed.     [provider]  traMADol (ULTRAM) 50 MG tablet Take 1-2 tablets (50-100 mg total) by mouth every 6 (six) hours as needed for moderate pain. 10/13/18   Harrie Foreman, PA-C    Family History    Family History  Problem Relation Age of Onset   Cancer Father        RENAL CA -DIED AGE 7O   Heart disease Maternal Grandfather    Heart failure Brother        ? heart dz.   Heart disease Brother     Prostate cancer Brother    Colon cancer Maternal Aunt    Colon cancer Maternal Aunt    Breast cancer Neg Hx    Pancreatic cancer Neg Hx    He indicated that his father is deceased. He indicated that his brother is alive. He indicated that his maternal grandmother is deceased. He indicated that his maternal grandfather is deceased. He indicated that his paternal grandmother is deceased. He indicated that his paternal grandfather is deceased. He indicated that the status of his neg hx is unknown.  Social History    Social History   Socioeconomic History   Marital status: Married    Spouse name: Elnita Maxwell    Number of children: 2   Years of education: Not on file   Highest education level: Not on file  Occupational History    Comment: retired  Tobacco Use   Smoking status: Never   Smokeless tobacco: Former    Types: Chew    Quit date: 01/08/2005  Vaping Use   Vaping status: Never Used  Substance and Sexual Activity   Alcohol use: No   Drug use: No   Sexual activity: Yes  Other Topics Concern   Not on file  Social History Narrative   He is a married father of 2.   He does not smoke or drink alcohol.   Up until recently when he hurt his left knee, he was continuing to exercise vigorously at least 2-3 times a week sometimes up to 2 hours of time doing cardiovascular and muscle strength exercises.   Social Drivers of Corporate investment banker Strain: Not on file  Food Insecurity: Not on file  Transportation Needs: Not on file  Physical Activity: Not on file  Stress: Not on file  Social Connections: Unknown (05/19/2021)   Received from [facility], [facility]   Social Network    Social Network: Not on file  Intimate Partner Violence: Unknown (04/10/2021)   Received from [facility], [facility]   HITS    Physically Hurt: Not  on file    Insult or Talk Down To: Not on file    Threaten Physical Harm: Not on file    Scream or Curse: Not on file     Review of  Systems    General:  No chills, fever, night sweats or weight changes.  Cardiovascular:  No chest pain, dyspnea on exertion, edema, orthopnea, palpitations, paroxysmal nocturnal dyspnea. Dermatological: No rash, lesions/masses Respiratory: No cough, dyspnea Urologic: No hematuria, dysuria Abdominal:   No nausea, vomiting, diarrhea, bright red blood per rectum, melena, or hematemesis Neurologic:  No visual changes, wkns, changes in mental status. All other systems reviewed and are otherwise negative except as noted above.  Physical Exam    VS:  BP 128/72 (BP Location: Left Arm, Patient Position: Sitting, Cuff Size: Normal)   Pulse 60   Ht 5\' 10"  (1.778 m)   Wt 179 lb 6.4 oz (81.4 kg)   SpO2 96%   BMI 25.74 kg/m  , BMI Body mass index is 25.74 kg/m. GEN: Well nourished, well developed, in no acute distress. HEENT: normal. Neck: Supple, no JVD, carotid bruits, or masses. Cardiac: RRR, no murmurs, rubs, or gallops. No clubbing, cyanosis, edema.  Radials/DP/PT 2+ and equal bilaterally.  Respiratory:  Respirations regular and unlabored, clear to auscultation bilaterally. GI: Soft, nontender, nondistended, BS + x 4. MS: no deformity or atrophy. Skin: warm and dry, no rash. Neuro:  Strength and sensation are intact. Psych: Normal affect.  Accessory Clinical Findings    Recent Labs: No results found for requested labs within last 365 days.   Recent Lipid Panel    Component Value Date/Time   CHOL 211 (H) 03/05/2016 0904   TRIG 203 (H) 03/05/2016 0904   HDL 34 (L) 03/05/2016 0904   CHOLHDL 6.2 (H) 03/05/2016 0904   VLDL 41 (H) 03/05/2016 0904   LDLCALC 136 (H) 03/05/2016 0904         ECG personally reviewed by me today- EKG Interpretation Date/Time:  Tuesday December 25 2022 09:26:35 EST Ventricular Rate:  60 PR Interval:  198 QRS Duration:  98 QT Interval:  410 QTC Calculation: 410 R Axis:   52  Text Interpretation: Sinus rhythm with marked sinus arrhythmia  Incomplete right bundle branch block T wave abnormality, consider lateral ischemia When compared with ECG of 19-Jul-2015 14:09, No significant change was found Confirmed by Edd Fabian [phone]) on 12/25/2022 9:35:39 AM   Nuclear stress test 11/14/2020    The study is normal. The study is low risk.   No ST deviation was noted.   LV perfusion is normal. There is no evidence of ischemia. There is no evidence of infarction.   Left ventricular function is normal. Nuclear stress EF: 67 %. The left ventricular ejection fraction is hyperdynamic (>65%). End diastolic cavity size is normal. Paradoxical septal motion secondary to post-op status.   Prior study available for comparison from 06/28/2015. No changes on perfusion images compared to prior study.      Assessment & Plan   1.  Coronary artery disease/status post CABG-no recent episodes of angina at rest or with exertion.  Status post CABG in 2012.  Negative stress test 2022.  Continues to stay somewhat physically active.  Does not have a formal exercise routine. Heart healthy low-sodium diet Continue current medical therapy Maintain physical activity  Hyperlipidemia-LDL 76 on 10/09/22.  Intolerant of higher dose statins and ezetimibe High-fiber diet Continue statin therapy  Essential hypertension-BP today 128/72. Maintain blood pressure log Continue losartan, HCTZ, metoprolol  Dizziness-denies further episodes. Maintain p.o. hydration Change positions slowly Eat regular small meals-episode previously felt to be related to not eating/possible low blood sugar  Disposition: Follow-up with Dr. Herbie Baltimore or me in 12 months.   Thomasene Ripple. Jocabed Cheese NP-C     12/25/2022, 9:57 AM [facility] [address] Office [phone] Fax [phone]    I spent 15 minutes examining this patient, reviewing medications, and using patient centered shared decision making involving her cardiac care.   I spent  greater than 20 minutes reviewing her past medical history,  medications, and prior cardiac tests.

## 2022-12-25 ENCOUNTER — Ambulatory Visit: Payer: Medicare Other | Attending: General Practice | Admitting: General Practice

## 2022-12-25 ENCOUNTER — Encounter: Payer: Self-pay | Admitting: General Practice

## 2022-12-25 VITALS — BP 128/72 | HR 60 | Ht 70.0 in | Wt 179.4 lb

## 2022-12-25 DIAGNOSIS — Z951 Presence of aortocoronary bypass graft: Secondary | ICD-10-CM | POA: Diagnosis not present

## 2022-12-25 DIAGNOSIS — I2510 Atherosclerotic heart disease of native coronary artery without angina pectoris: Secondary | ICD-10-CM | POA: Diagnosis not present

## 2022-12-25 DIAGNOSIS — I10 Essential (primary) hypertension: Secondary | ICD-10-CM | POA: Insufficient documentation

## 2022-12-25 NOTE — Patient Instructions (Signed)
Medication Instructions:  The current medical regimen is effective;  continue present plan and medications as directed. Please refer to the Current Medication list given to you today.  *If you need a refill on your cardiac medications before your next appointment, please call your pharmacy*  Lab Work: NONE  Other Instructions INCREASE PHYSICAL ACTIVITY AS TOLERATED PLEASE READ AND FOLLOW ATTACHED  SALTY 6-SEE CHART BELOW  Follow-Up: At [facility], you and your health needs are our priority.  As part of our continuing mission to provide you with exceptional heart care, we have created designated Provider Care Teams.  These Care Teams include your primary Cardiologist (physician) and Advanced Practice Providers (APPs -  Physician Assistants and Nurse Practitioners) who all work together to provide you with the care you need, when you need it.  Your next appointment:   12 month(s)  Provider:   Bryan Lemma, MD  or Edd Fabian, FNP      CALL IN AUGUST 2025 FOR DECEMBER 2025 APPT

## 2023-01-16 DIAGNOSIS — L821 Other seborrheic keratosis: Secondary | ICD-10-CM | POA: Diagnosis not present

## 2023-01-16 DIAGNOSIS — L570 Actinic keratosis: Secondary | ICD-10-CM | POA: Diagnosis not present

## 2023-01-16 DIAGNOSIS — D0462 Carcinoma in situ of skin of left upper limb, including shoulder: Secondary | ICD-10-CM | POA: Diagnosis not present

## 2023-01-16 DIAGNOSIS — L820 Inflamed seborrheic keratosis: Secondary | ICD-10-CM | POA: Diagnosis not present

## 2023-01-16 DIAGNOSIS — D1801 Hemangioma of skin and subcutaneous tissue: Secondary | ICD-10-CM | POA: Diagnosis not present

## 2023-01-16 DIAGNOSIS — L0109 Other impetigo: Secondary | ICD-10-CM | POA: Diagnosis not present

## 2023-01-16 DIAGNOSIS — D485 Neoplasm of uncertain behavior of skin: Secondary | ICD-10-CM | POA: Diagnosis not present

## 2023-01-16 DIAGNOSIS — Z85828 Personal history of other malignant neoplasm of skin: Secondary | ICD-10-CM | POA: Diagnosis not present

## 2023-01-16 DIAGNOSIS — L814 Other melanin hyperpigmentation: Secondary | ICD-10-CM | POA: Diagnosis not present

## 2023-01-21 ENCOUNTER — Other Ambulatory Visit: Payer: Self-pay

## 2023-01-21 ENCOUNTER — Encounter: Payer: Self-pay | Admitting: Emergency Medicine

## 2023-01-21 ENCOUNTER — Ambulatory Visit
Admission: EM | Admit: 2023-01-21 | Discharge: 2023-01-21 | Disposition: A | Discharge status: Other Acute Inpatient Hospital | Payer: Medicare Other | Attending: Family Medicine | Admitting: Family Medicine

## 2023-01-21 DIAGNOSIS — J069 Acute upper respiratory infection, unspecified: Secondary | ICD-10-CM | POA: Diagnosis not present

## 2023-01-21 DIAGNOSIS — R55 Syncope and collapse: Secondary | ICD-10-CM

## 2023-01-21 DIAGNOSIS — J101 Influenza due to other identified influenza virus with other respiratory manifestations: Secondary | ICD-10-CM | POA: Diagnosis not present

## 2023-01-21 DIAGNOSIS — Z79899 Other long term (current) drug therapy: Secondary | ICD-10-CM | POA: Diagnosis not present

## 2023-01-21 DIAGNOSIS — I10 Essential (primary) hypertension: Secondary | ICD-10-CM | POA: Diagnosis not present

## 2023-01-21 DIAGNOSIS — Z888 Allergy status to other drugs, medicaments and biological substances status: Secondary | ICD-10-CM | POA: Diagnosis not present

## 2023-01-21 DIAGNOSIS — Z7982 Long term (current) use of aspirin: Secondary | ICD-10-CM | POA: Diagnosis not present

## 2023-01-21 DIAGNOSIS — I25810 Atherosclerosis of coronary artery bypass graft(s) without angina pectoris: Secondary | ICD-10-CM | POA: Diagnosis not present

## 2023-01-21 DIAGNOSIS — R059 Cough, unspecified: Secondary | ICD-10-CM | POA: Diagnosis not present

## 2023-01-21 DIAGNOSIS — Z87891 Personal history of nicotine dependence: Secondary | ICD-10-CM | POA: Diagnosis not present

## 2023-01-21 DIAGNOSIS — R042 Hemoptysis: Secondary | ICD-10-CM

## 2023-01-21 DIAGNOSIS — R9431 Abnormal electrocardiogram [ECG] [EKG]: Secondary | ICD-10-CM | POA: Diagnosis not present

## 2023-01-21 DIAGNOSIS — Z951 Presence of aortocoronary bypass graft: Secondary | ICD-10-CM | POA: Diagnosis not present

## 2023-01-21 DIAGNOSIS — E785 Hyperlipidemia, unspecified: Secondary | ICD-10-CM | POA: Diagnosis not present

## 2023-01-21 DIAGNOSIS — Z881 Allergy status to other antibiotic agents status: Secondary | ICD-10-CM | POA: Diagnosis not present

## 2023-01-21 DIAGNOSIS — Z882 Allergy status to sulfonamides status: Secondary | ICD-10-CM | POA: Diagnosis not present

## 2023-01-21 LAB — POCT INFLUENZA A/B
Influenza A, POC: NEGATIVE
Influenza B, POC: NEGATIVE

## 2023-01-21 LAB — POC SARS CORONAVIRUS 2 AG -  ED: SARS Coronavirus 2 Ag: NEGATIVE

## 2023-01-21 NOTE — ED Notes (Signed)
 Patient is being discharged from the Urgent Care and sent to the Emergency Department via ems . Per nelson md, patient is in need of higher level of care due to low oxygen and syncopal event. Patient is aware and verbalizes understanding of plan of care.  Vitals:   01/21/23 0953 01/21/23 0955  BP: (!) 157/82   Pulse:  91  Resp: 18   Temp: 98.2 F (36.8 C)   SpO2:  99%

## 2023-01-21 NOTE — Discharge Instructions (Addendum)
 You need to go to the emergency room for additional care  I believe the nausea and decreased responsiveness were from syncope (fainting) you need further evaluation

## 2023-01-21 NOTE — ED Triage Notes (Signed)
 Patient c/o productive cough w/blood, congestion, headache and diarrhea x 2 days.  Patient did take some OTC cough meds.

## 2023-01-21 NOTE — ED Notes (Signed)
 911 called

## 2023-01-21 NOTE — ED Notes (Signed)
 Pt departed UC with paramedics

## 2023-01-21 NOTE — ED Provider Notes (Signed)
 [name]    CSN: [id number] Arrival date & time: 01/21/23  9062      History   Chief Complaint Chief Complaint  Patient presents with   Cough    HPI [name] is a 78 y.o. male.   HPI  Pleasant 78 year old gentleman who is here for upper respiratory infection, coughing, sputum with blood streaks.  No chest pain.  No shortness of breath.  He does have some sweats and chills, achiness and tiredness.  No known exposure to illness Patient has known cardiac disease hyperlipidemia and hypertension.  Also has history of treatment for prostate cancer  Past Medical History:  Diagnosis Date   Acquired trigger finger of both middle fingers    Acquired trigger finger of both ring fingers    Arthritis    CAD in native artery 08/2010   Cardiac CATH for Abn EKG- GXT portio of Myoview : Diffuse market Inferior ST depressions & STE in V1 and V2; NO scintigraphic evidence of ischemia  => CATH (MV CAD): 70% d LM, 90% ost LAD & 60-70% pLAD; Minimal LCx & p-d RCA but 99% ostrPDA ( BALANCED ISCHEMIA) => CABGx4 (LIMA-LAD, SVG-DiaG, SVG-OM, SVG-rPDA; Myoivew 11/2020 - EF 65-70%, No Ischemia/Infarction.   Cancer (HCC)    skin cancers-squamous cell / facial    Dyslipidemia - low HDL    Lipids monitored by Dr. Clarice.   GERD (gastroesophageal reflux disease)    History of multiple allergies    History of vertigo    Hypertension    Labile   Low testosterone     Recently stopped replacement therapy.   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 RCA had moderate irregularities and a subtotal proximal PDA   CARDIOVASCULAR STRESS TEST  02/06/2011   EKG negative for ischemia. No significant wall motion abnormalities noted.   CATARACT EXTRACTION, BILATERAL     CHOLECYSTECTOMY N/A 07/29/2015   Procedure: LAPAROSCOPIC CHOLECYSTECTOMY WITH INTRAOPERATIVE CHOLANGIOGRAM;  Surgeon: Krystal Russell, MD;  Location: [facility];  Service: General;  Laterality: N/A;   COLONOSCOPY     CORONARY ARTERY BYPASS GRAFT  08/29/2010   x4. LIMA to LAD, SVG to diag, SVG to OM, SVG to PDA. Rt thigh vein harvest   LYMPHADENECTOMY Bilateral 10/13/2018   Procedure: LYMPHADENECTOMY, PELVIC;  Surgeon: Renda Glance, MD;  Location: [facility];  Service: Urology;  Laterality: Bilateral;   MOHS SURGERY     Face and Neck Left eyelid   NM MYOVIEW   LTD  11/2020   No Ischemia or Infarction. EF 65-70%. LOW RISK. NORMAL   PRE CABG DOPPLER  08/25/2010   No significant extracranial carotid artery stenosis. ECA stenosis noted bilaterally - ICA/CCA ratio id 0.9-rt and 0.74-lft   ROBOT ASSISTED LAPAROSCOPIC RADICAL PROSTATECTOMY N/A 10/13/2018   Procedure: XI ROBOTIC ASSISTED LAPAROSCOPIC RADICAL PROSTATECTOMY LEVEL 2;  Surgeon: Renda Glance, MD;  Location: [facility];  Service: Urology;  Laterality: N/A;   SURGERY SCROTAL / TESTICULAR     Benign nodule   TONSILLECTOMY     TRANSTHORACIC ECHOCARDIOGRAM  07/27/2010   EF >55%, mild concentric LVH, stage 1 diastolic dysfunction       Home Medications     Prior to Admission medications   Medication Sig Start Date End Date Taking? Authorizing Provider  aspirin  EC 81 MG tablet Take 81 mg by mouth daily. Swallow whole.   Yes [provider]  co-enzyme Q-10 30 MG capsule Take 30 mg by mouth 3 (three) times daily.   Yes [provider]  losartan -hydrochlorothiazide  (HYZAAR) 100-12.5 MG tablet Take 1 tablet by mouth daily.   Yes [provider]  metoprolol  tartrate (LOPRESSOR ) 25 MG tablet TAKE 1 TABLET TWICE A DAY 03/14/22  Yes Anner Alm ORN, MD  pantoprazole  (PROTONIX ) 40 MG tablet Take 40 mg by mouth daily.     Yes [provider]  rosuvastatin  (CRESTOR ) 5 MG tablet Take 5 mg by mouth at bedtime. Pt takes 5 mg daily at bedtime   Yes [provider]  sildenafil (REVATIO) 20 MG tablet Take 20 mg by mouth daily as needed.    Yes [provider]  acetaminophen  (TYLENOL ) 325 MG tablet Take 650 mg by mouth daily as needed for moderate pain or headache. Take 2  Daily as needed     [provider]  diphenhydrAMINE  (BENADRYL ) 25 MG tablet Take 25 mg by mouth daily as needed for allergies.     [provider]  doxycycline (VIBRAMYCIN) 100 MG capsule Take 100 mg by mouth daily as needed (rosacea).     [provider]    Family History Family History  Problem Relation Age of Onset   Cancer Father        RENAL CA -DIED AGE 7O   Heart failure Brother        ? heart dz.   Heart disease Brother    Prostate cancer Brother    Heart disease Maternal Grandfather    Colon cancer Maternal Aunt    Colon cancer Maternal Aunt    Breast cancer Neg Hx    Pancreatic cancer Neg Hx     Social History Social History   Tobacco Use   Smoking status: Never   Smokeless tobacco: Former    Types: Chew    Quit date: 01/08/2005  Vaping Use   Vaping status: Never Used  Substance Use Topics   Alcohol use: No   Drug use: No     Allergies   Cephalosporins, Triamterene-hctz, Statins,  Vioxx [rofecoxib], Zetia  [ezetimibe ], Lisinopril, and Sulfa antibiotics   Review of Systems Review of Systems  See HPI Physical Exam Triage Vital Signs ED Triage Vitals  Encounter Vitals Group     BP 01/21/23 0953 (!) 157/82     Systolic BP Percentile --      Diastolic BP Percentile --      Pulse Rate 01/21/23 0955 91     Resp 01/21/23 0953 18     Temp 01/21/23 0953 98.2 F (  36.8 C)     Temp Source 01/21/23 0953 Oral     SpO2 01/21/23 0955 99 %     Weight 01/21/23 0955 172 lb (78 kg)     Height 01/21/23 0955 5' 10 (1.778 m)     Head Circumference --      Peak Flow --      Pain Score 01/21/23 0955 0     Pain Loc --      Pain Education --      Exclude from Growth Chart --    No data found.  Updated Vital Signs BP (!) 159/90   Pulse 78   Temp 98.1 F (36.7 C)   Resp 18   Ht 5' 10 (1.778 m)   Wt 78 kg   SpO2 98%   BMI 24.68 kg/m      Physical Exam Constitutional:      General: He is not in acute distress.    Appearance: He is well-developed and normal weight. He is ill-appearing.  HENT:     Head: Normocephalic and atraumatic.     Mouth/Throat:     Mouth: Mucous membranes are moist.     Pharynx: No posterior oropharyngeal erythema.  Eyes:     Conjunctiva/sclera: Conjunctivae normal.     Pupils: Pupils are equal, round, and reactive to light.  Cardiovascular:     Rate and Rhythm: Normal rate.     Heart sounds: Normal heart sounds.  Pulmonary:     Effort: Pulmonary effort is normal. No respiratory distress.     Comments: Crackles both bases Abdominal:     General: There is no distension.     Palpations: Abdomen is soft.  Musculoskeletal:        General: Normal range of motion.     Cervical back: Normal range of motion.  Skin:    General: Skin is warm and dry.  Neurological:     Mental Status: He is alert.      UC Treatments / Results  Labs (all labs ordered are listed, but only abnormal results are displayed) Labs Reviewed  POCT INFLUENZA A/B  - Normal  POC SARS CORONAVIRUS 2 AG -  ED  COVID and flu tests are negativ  EKG   Radiology No results found.  Procedures  While here, patient felt hot and dropped back on the table.  Wife thought he was asleep.  When I came in he was unable to be aroused.  With loud conversation stimulation he did wake up and speak to us .  It was several minutes before he made sense.  We did a series of vital signs and evaluations.  Initial blood pressure was near 80/60.  Did come back up with time.  I feel the patient had a syncopal episode.  Given his cardiac status I think he needs to be evaluated further to make sure he did not suffer any ischemia when he was being hypoperfused   Initial Impression / Assessment and Plan / UC Course  I have reviewed the triage vital signs and the nursing notes.  Pertinent labs & imaging results that were available during my care of the patient were reviewed by me and considered in my medical decision making (see chart for details).   Patient was discharged to the emergency room via ambulance   Final Clinical Impressions(s) / UC Diagnoses   Final diagnoses:  Viral URI with cough  Hemoptysis  Syncope, unspecified syncope type  Coronary artery disease involving coronary bypass graft of  native heart without angina pectoris     Discharge Instructions      You need to go to the emergency room for additional care  I believe the nausea and decreased responsiveness were from syncope (fainting) you need further evaluation     ED Prescriptions   None    PDMP not reviewed this encounter.   Maranda Jamee Jacob, MD 01/21/23 1116

## 2023-01-21 NOTE — ED Notes (Signed)
 Patient had syncopal event with Dr. Maranda called to bedside. Pt bp 98/62 at the time. Patient was roused by Dr. Maranda and was slow to arouse but did eventually come back around. Fsbs checked was 119. At 1102 bp 138/78. At 1105 had event where he was shivering, sp02 was 74% on RA, placed pt on 2L per [place] and sp02 increased to 95%. VS before departing UC with ems were 159/90, 98.1 F oral, 18 rr, 78 HR, 97% on 2L per [place].

## 2023-02-04 DIAGNOSIS — R197 Diarrhea, unspecified: Secondary | ICD-10-CM | POA: Diagnosis not present

## 2023-02-04 DIAGNOSIS — R55 Syncope and collapse: Secondary | ICD-10-CM | POA: Diagnosis not present

## 2023-02-05 DIAGNOSIS — R197 Diarrhea, unspecified: Secondary | ICD-10-CM | POA: Diagnosis not present

## 2023-02-18 DIAGNOSIS — J310 Chronic rhinitis: Secondary | ICD-10-CM | POA: Diagnosis not present

## 2023-02-18 DIAGNOSIS — Z8619 Personal history of other infectious and parasitic diseases: Secondary | ICD-10-CM | POA: Diagnosis not present

## 2023-02-18 DIAGNOSIS — R7401 Elevation of levels of liver transaminase levels: Secondary | ICD-10-CM | POA: Diagnosis not present

## 2023-03-01 DIAGNOSIS — R7401 Elevation of levels of liver transaminase levels: Secondary | ICD-10-CM | POA: Diagnosis not present

## 2023-03-01 DIAGNOSIS — A0472 Enterocolitis due to Clostridium difficile, not specified as recurrent: Secondary | ICD-10-CM | POA: Diagnosis not present

## 2023-03-01 DIAGNOSIS — Z1211 Encounter for screening for malignant neoplasm of colon: Secondary | ICD-10-CM | POA: Diagnosis not present

## 2023-03-19 DIAGNOSIS — J300 Vasomotor rhinitis: Secondary | ICD-10-CM | POA: Diagnosis not present

## 2023-04-10 DIAGNOSIS — Z1211 Encounter for screening for malignant neoplasm of colon: Secondary | ICD-10-CM | POA: Diagnosis not present

## 2023-04-10 DIAGNOSIS — K635 Polyp of colon: Secondary | ICD-10-CM | POA: Diagnosis not present

## 2023-04-10 DIAGNOSIS — K5730 Diverticulosis of large intestine without perforation or abscess without bleeding: Secondary | ICD-10-CM | POA: Diagnosis not present

## 2023-04-12 DIAGNOSIS — K635 Polyp of colon: Secondary | ICD-10-CM | POA: Diagnosis not present

## 2023-06-06 DIAGNOSIS — M25512 Pain in left shoulder: Secondary | ICD-10-CM | POA: Diagnosis not present

## 2023-06-12 DIAGNOSIS — C61 Malignant neoplasm of prostate: Secondary | ICD-10-CM | POA: Diagnosis not present

## 2023-06-19 DIAGNOSIS — C61 Malignant neoplasm of prostate: Secondary | ICD-10-CM | POA: Diagnosis not present

## 2023-06-26 DIAGNOSIS — M25512 Pain in left shoulder: Secondary | ICD-10-CM | POA: Diagnosis not present

## 2023-06-28 DIAGNOSIS — M75102 Unspecified rotator cuff tear or rupture of left shoulder, not specified as traumatic: Secondary | ICD-10-CM | POA: Diagnosis not present

## 2023-06-28 DIAGNOSIS — G8929 Other chronic pain: Secondary | ICD-10-CM | POA: Diagnosis not present

## 2023-07-01 ENCOUNTER — Telehealth: Payer: Self-pay

## 2023-07-01 DIAGNOSIS — M25512 Pain in left shoulder: Secondary | ICD-10-CM | POA: Diagnosis not present

## 2023-07-01 NOTE — Telephone Encounter (Signed)
   Pre-operative Risk Assessment    Patient Name: [name]  DOB: 03-03-1945 MRN: [id number]   Date of last office visit: 12/25/2022, Josefa Beauvais, NP Date of next office visit: NONE   Request for Surgical Clearance    Procedure:  Rotator cuff repair left shoulder  Date of Surgery:  Clearance TBD                                Surgeon: Dr. Juliane Billing, MD, FAAOS Surgeon's Group or Practice Name: [facility] Phone number: [phone] Fax number: [phone] Attn: Katheryn Dustman   Type of Clearance Requested:   - Medical    Type of Anesthesia:  Not Indicated   Additional requests/questions:    [name] [name]   07/01/2023, 4:49 PM

## 2023-07-02 ENCOUNTER — Telehealth: Payer: Self-pay | Admitting: *Deleted

## 2023-07-02 NOTE — Telephone Encounter (Signed)
   Name: [name]  DOB: 1945-08-09  MRN: [id number]  Primary Cardiologist: Alm Clay, MD   Preoperative team, please contact this patient and set up a phone call appointment for further preoperative risk assessment. Please obtain consent and complete medication review. Thank you for your help.  I confirm that guidance regarding antiplatelet and oral anticoagulation therapy has been completed and, if necessary, noted below.  None requested   I also confirmed the patient resides in the state of [place] . As per [facility] Medical Board telemedicine laws, the patient must reside in the state in which the provider is licensed.   [name], NP 07/02/2023, 8:52 AM [facility]

## 2023-07-02 NOTE — Telephone Encounter (Signed)
 ADDENDUM TO CLEARANCE REQUEST; SURGEON WILL NEED RECOMMENDATIONS FOR ASA HOLD.   Pt has been scheduled tele preop appt 07/09/23. Med rec and consent are done.

## 2023-07-02 NOTE — Telephone Encounter (Signed)
 Pt appt has been moved up to 07/05/23; pt states he is in a lot of pain and would like sooner appt please.

## 2023-07-02 NOTE — Telephone Encounter (Signed)
 Pt has been scheduled tele preop appt 07/09/23. Med rec and consent are done. Pt has been put on wait list as well.       Patient Consent for Virtual Visit        [name] has provided verbal consent on 07/02/2023 for a virtual visit (video or telephone).   CONSENT FOR VIRTUAL VISIT FOR:  [name]  By participating in this virtual visit I agree to the following:  I hereby voluntarily request, consent and authorize [facility] and its employed or contracted physicians, physician assistants, nurse practitioners or other licensed health care professionals (the Practitioner), to provide me with telemedicine health care services (the "Services) as deemed necessary by the treating Practitioner. I acknowledge and consent to receive the Services by the Practitioner via telemedicine. I understand that the telemedicine visit will involve communicating with the Practitioner through live audiovisual communication technology and the disclosure of certain medical information by electronic transmission. I acknowledge that I have been given the opportunity to request an in-person assessment or other available alternative prior to the telemedicine visit and am voluntarily participating in the telemedicine visit.  I understand that I have the right to withhold or withdraw my consent to the use of telemedicine in the course of my care at any time, without affecting my right to future care or treatment, and that the Practitioner or I may terminate the telemedicine visit at any time. I understand that I have the right to inspect all information obtained and/or recorded in the course of the telemedicine visit and may receive copies of available information for a reasonable fee.  I understand that some of the potential risks of receiving the Services via telemedicine include:  Delay or interruption in medical evaluation due to technological equipment failure or disruption; Information transmitted may  not be sufficient (e.g. poor resolution of images) to allow for appropriate medical decision making by the Practitioner; and/or  In rare instances, security protocols could fail, causing a breach of personal health information.  Furthermore, I acknowledge that it is my responsibility to provide information about my medical history, conditions and care that is complete and accurate to the best of my ability. I acknowledge that Practitioner's advice, recommendations, and/or decision may be based on factors not within their control, such as incomplete or inaccurate data provided by me or distortions of diagnostic images or specimens that may result from electronic transmissions. I understand that the practice of medicine is not an exact science and that Practitioner makes no warranties or guarantees regarding treatment outcomes. I acknowledge that a copy of this consent can be made available to me via my patient portal [facility] MyChart), or I can request a printed copy by calling the office of [facility].    I understand that my insurance will be billed for this visit.   I have read or had this consent read to me. I understand the contents of this consent, which adequately explains the benefits and risks of the Services being provided via telemedicine.  I have been provided ample opportunity to ask questions regarding this consent and the Services and have had my questions answered to my satisfaction. I give my informed consent for the services to be provided through the use of telemedicine in my medical care

## 2023-07-02 NOTE — Telephone Encounter (Signed)
 Pt has been scheduled tele preop appt 07/09/23. Med rec and consent are done. Pt has been put on wait list as well.

## 2023-07-03 DIAGNOSIS — Z01818 Encounter for other preprocedural examination: Secondary | ICD-10-CM | POA: Diagnosis not present

## 2023-07-03 DIAGNOSIS — I10 Essential (primary) hypertension: Secondary | ICD-10-CM | POA: Diagnosis not present

## 2023-07-03 DIAGNOSIS — E7800 Pure hypercholesterolemia, unspecified: Secondary | ICD-10-CM | POA: Diagnosis not present

## 2023-07-03 DIAGNOSIS — Z951 Presence of aortocoronary bypass graft: Secondary | ICD-10-CM | POA: Diagnosis not present

## 2023-07-05 ENCOUNTER — Ambulatory Visit: Attending: Cardiology

## 2023-07-05 DIAGNOSIS — Z01810 Encounter for preprocedural cardiovascular examination: Secondary | ICD-10-CM

## 2023-07-05 NOTE — Progress Notes (Signed)
 Virtual Visit via Telephone Note   Because of [name] co-morbid illnesses, he is at least at moderate risk for complications without adequate follow up.  This format is felt to be most appropriate for this patient at this time.  Due to technical limitations with video connection (technology), today's appointment will be conducted as an audio only telehealth visit, and [name] verbally agreed to proceed in this manner.   All issues noted in this document were discussed and addressed.  No physical exam could be performed with this format.  Evaluation Performed:  Preoperative cardiovascular risk assessment _____________   Date:  07/05/2023  Patient ID:  [name], DOB September 21, 1945, MRN [id number] Patient Location:  Home Provider location:   Office Primary Care Provider:  Clarice Nottingham, MD Primary Cardiologist:  Alm Clay, MD Chief Complaint / Patient Profile  78 y.o. y/o male with a h/o CAD s/p CABG x 4 in 2012, hyperlipidemia, prostate cancer who is pending rotator cuff repair of left shoulder and presents today for telephonic preoperative cardiovascular risk assessment. History of Present Illness  [name] is a 78 y.o. male who presents via audio/video conferencing for a telehealth visit today.  Pt was last seen in cardiology clinic on 12/25/2022 by Josefa Beauvais, NP.  At that time [name] was doing well.  The patient is now pending procedure as outlined above. Since his last visit, he has remained stable from a cardiac standpoint.  Patient is able to achieve greater than 4 METS of activity with walking, household chores and completing outside yard work. Today he denies chest pain, shortness of breath, lower extremity edema, fatigue, palpitations, melena, hematuria, hemoptysis, diaphoresis, weakness, presyncope, syncope, orthopnea, and PND.  Past Medical History    Past Medical History:  Diagnosis Date   Acquired trigger finger of both middle fingers    Acquired  trigger finger of both ring fingers    Arthritis    CAD in native artery 08/2010   Cardiac CATH for Abn EKG- GXT portio of Myoview : Diffuse market Inferior ST depressions & STE in V1 and V2; NO scintigraphic evidence of ischemia  => CATH (MV CAD): 70% d LM, 90% ost LAD & 60-70% pLAD; Minimal LCx & p-d RCA but 99% ostrPDA ( BALANCED ISCHEMIA) => CABGx4 (LIMA-LAD, SVG-DiaG, SVG-OM, SVG-rPDA; Myoivew 11/2020 - EF 65-70%, No Ischemia/Infarction.   Cancer (HCC)    skin cancers-squamous cell / facial    Dyslipidemia - low HDL    Lipids monitored by Dr. Clarice.   GERD (gastroesophageal reflux disease)    History of multiple allergies    History of vertigo    Hypertension    Labile   Low testosterone     Recently stopped replacement therapy.   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   Urinary incontinence, urge    Wears glasses    Past Surgical History:  Procedure Laterality Date   arthroscopy of knees bilat      CARDIAC CATHETERIZATION  08/18/2010   Terminal left main 70%, ostial LAD 90%, sequential 60-70% lesion in the proximal LAD, mild circumflex lesions, but large OM1 and OM2. RCA had moderate irregularities and a subtotal proximal PDA   CARDIOVASCULAR STRESS TEST  02/06/2011   EKG negative for ischemia. No significant wall motion abnormalities noted.   CATARACT EXTRACTION, BILATERAL     CHOLECYSTECTOMY N/A  07/29/2015   Procedure: LAPAROSCOPIC CHOLECYSTECTOMY WITH INTRAOPERATIVE CHOLANGIOGRAM;  Surgeon: Krystal Russell, MD;  Location: [facility];  Service: General;  Laterality: N/A;   COLONOSCOPY     CORONARY ARTERY BYPASS GRAFT  08/29/2010   x4. LIMA to LAD, SVG to diag, SVG to OM, SVG to PDA. Rt thigh vein harvest   LYMPHADENECTOMY Bilateral 10/13/2018   Procedure: LYMPHADENECTOMY, PELVIC;  Surgeon: Renda Glance, MD;  Location: [facility];  Service: Urology;  Laterality: Bilateral;   MOHS SURGERY     Face and  Neck Left eyelid   NM MYOVIEW  LTD  11/2020   No Ischemia or Infarction. EF 65-70%. LOW RISK. NORMAL   PRE CABG DOPPLER  08/25/2010   No significant extracranial carotid artery stenosis. ECA stenosis noted bilaterally - ICA/CCA ratio id 0.9-rt and 0.74-lft   ROBOT ASSISTED LAPAROSCOPIC RADICAL PROSTATECTOMY N/A 10/13/2018   Procedure: XI ROBOTIC ASSISTED LAPAROSCOPIC RADICAL PROSTATECTOMY LEVEL 2;  Surgeon: Renda Glance, MD;  Location: [facility];  Service: Urology;  Laterality: N/A;   SURGERY SCROTAL / TESTICULAR     Benign nodule   TONSILLECTOMY     TRANSTHORACIC ECHOCARDIOGRAM  07/27/2010   EF >55%, mild concentric LVH, stage 1 diastolic dysfunction   Allergies Allergies  Allergen Reactions   Cephalosporins Palpitations   Triamterene-Hctz Palpitations   Statins Other (See Comments)     elevated muscle enzymes Lipitor   Vioxx [Rofecoxib] Swelling    Facial swelling   Zetia  [Ezetimibe ]     Increased muscle enzymes; GI upset   Lisinopril Cough   Sulfa Antibiotics Rash   Home Medications    Prior to Admission medications   Medication Sig Start Date End Date Taking? Authorizing Provider  acetaminophen  (TYLENOL ) 325 MG tablet Take 650 mg by mouth daily as needed for moderate pain or headache. Take 2  Daily as needed     [provider]  aspirin  EC 81 MG tablet Take 81 mg by mouth daily. Swallow whole.    [provider]  co-enzyme Q-10 30 MG capsule Take 30 mg by mouth 3 (three) times daily.    [provider]  diphenhydrAMINE  (BENADRYL ) 25 MG tablet Take 25 mg by mouth daily as needed for allergies.     [provider]  doxycycline (VIBRAMYCIN) 100 MG capsule Take 100 mg by mouth daily as needed (rosacea).     [provider]  losartan  (COZAAR ) 50 MG tablet Take 50 mg by mouth daily.    [provider]  losartan -hydrochlorothiazide  (HYZAAR) 100-12.5 MG tablet Take 1 tablet by mouth daily. Patient not taking: Reported on  07/02/2023    [provider]  metoprolol  tartrate (LOPRESSOR ) 25 MG tablet TAKE 1 TABLET TWICE A DAY Patient taking differently: Take 25 mg by mouth as directed. 25 MG IN THE AM AND 12.5 MG IN THE EVENING 03/14/22   Anner Alm ORN, MD  pantoprazole  (PROTONIX ) 40 MG tablet Take 40 mg by mouth daily.      [provider]  rosuvastatin  (CRESTOR ) 5 MG tablet Take 5 mg by mouth at bedtime. Pt takes 5 mg daily at bedtime    [provider]  sildenafil (REVATIO) 20 MG tablet Take 20 mg by mouth daily as needed.     [provider]   Physical Exam  Vital Signs:  [name] does not have vital signs available for review today. Given telephonic nature of communication, physical exam is limited. AAOx3. NAD. Normal affect.  Speech and respirations are unlabored. Accessory  Clinical Findings  None Assessment & Plan    1.  Preoperative Cardiovascular Risk Assessment: [name]'s perioperative risk of a major cardiac event is 0.9% according to the Revised Cardiac Risk Index (RCRI). Therefore, he is at low risk for perioperative complications.   His functional capacity is good at 7.99 METs according to the Duke Activity Status Index (DASI). Recommendations: According to ACC/AHA guidelines, no further cardiovascular testing needed.  The patient may proceed to surgery at acceptable risk.   Antiplatelet and/or Anticoagulation Recommendations: None requested.   The patient was advised that if he develops new symptoms prior to surgery to contact our office to arrange for a follow-up visit, and he verbalized understanding.  A copy of this note will be routed to requesting surgeon.  Time:   Today, I have spent 10 minutes with the patient with telehealth technology discussing medical history, symptoms, and management plan.    Ileana Chalupa D Kentarius Partington, NP  07/05/2023, 12:14 PM

## 2023-07-09 ENCOUNTER — Telehealth

## 2023-07-13 ENCOUNTER — Other Ambulatory Visit: Payer: Self-pay | Admitting: Cardiology

## 2023-08-06 NOTE — Telephone Encounter (Signed)
 Preop clearance tele visit notes have now been faxed over to [facility]

## 2023-08-06 NOTE — Telephone Encounter (Signed)
 [facility] called in asking asa hold to be faxed over to them

## 2023-08-09 DIAGNOSIS — S46212A Strain of muscle, fascia and tendon of other parts of biceps, left arm, initial encounter: Secondary | ICD-10-CM | POA: Diagnosis not present

## 2023-08-09 DIAGNOSIS — M24112 Other articular cartilage disorders, left shoulder: Secondary | ICD-10-CM | POA: Diagnosis not present

## 2023-08-09 DIAGNOSIS — S46012A Strain of muscle(s) and tendon(s) of the rotator cuff of left shoulder, initial encounter: Secondary | ICD-10-CM | POA: Diagnosis not present

## 2023-08-09 DIAGNOSIS — M7522 Bicipital tendinitis, left shoulder: Secondary | ICD-10-CM | POA: Diagnosis not present

## 2023-08-09 DIAGNOSIS — X58XXXA Exposure to other specified factors, initial encounter: Secondary | ICD-10-CM | POA: Diagnosis not present

## 2023-08-09 DIAGNOSIS — M7552 Bursitis of left shoulder: Secondary | ICD-10-CM | POA: Diagnosis not present

## 2023-08-09 DIAGNOSIS — G8918 Other acute postprocedural pain: Secondary | ICD-10-CM | POA: Diagnosis not present

## 2023-08-09 DIAGNOSIS — S43432A Superior glenoid labrum lesion of left shoulder, initial encounter: Secondary | ICD-10-CM | POA: Diagnosis not present

## 2023-08-09 DIAGNOSIS — Y999 Unspecified external cause status: Secondary | ICD-10-CM | POA: Diagnosis not present

## 2023-08-09 DIAGNOSIS — M7542 Impingement syndrome of left shoulder: Secondary | ICD-10-CM | POA: Diagnosis not present

## 2023-08-09 HISTORY — PX: SHOULDER ARTHROSCOPY: SHX128

## 2023-09-11 DIAGNOSIS — M25512 Pain in left shoulder: Secondary | ICD-10-CM | POA: Diagnosis not present

## 2023-09-18 DIAGNOSIS — M25512 Pain in left shoulder: Secondary | ICD-10-CM | POA: Diagnosis not present

## 2023-09-24 DIAGNOSIS — M25512 Pain in left shoulder: Secondary | ICD-10-CM | POA: Diagnosis not present

## 2023-09-27 DIAGNOSIS — M25512 Pain in left shoulder: Secondary | ICD-10-CM | POA: Diagnosis not present

## 2023-10-01 DIAGNOSIS — M25512 Pain in left shoulder: Secondary | ICD-10-CM | POA: Diagnosis not present

## 2023-10-04 DIAGNOSIS — M25512 Pain in left shoulder: Secondary | ICD-10-CM | POA: Diagnosis not present

## 2023-10-09 DIAGNOSIS — G5601 Carpal tunnel syndrome, right upper limb: Secondary | ICD-10-CM | POA: Diagnosis not present

## 2023-10-09 HISTORY — PX: HAND SURGERY: SHX662

## 2023-10-14 DIAGNOSIS — E7800 Pure hypercholesterolemia, unspecified: Secondary | ICD-10-CM | POA: Diagnosis not present

## 2023-10-14 DIAGNOSIS — I10 Essential (primary) hypertension: Secondary | ICD-10-CM | POA: Diagnosis not present

## 2023-10-14 DIAGNOSIS — Z125 Encounter for screening for malignant neoplasm of prostate: Secondary | ICD-10-CM | POA: Diagnosis not present

## 2023-10-16 DIAGNOSIS — M25512 Pain in left shoulder: Secondary | ICD-10-CM | POA: Diagnosis not present

## 2023-10-18 DIAGNOSIS — I2510 Atherosclerotic heart disease of native coronary artery without angina pectoris: Secondary | ICD-10-CM | POA: Diagnosis not present

## 2023-10-18 DIAGNOSIS — Z0000 Encounter for general adult medical examination without abnormal findings: Secondary | ICD-10-CM | POA: Diagnosis not present

## 2023-10-18 DIAGNOSIS — J310 Chronic rhinitis: Secondary | ICD-10-CM | POA: Diagnosis not present

## 2023-10-18 DIAGNOSIS — M25512 Pain in left shoulder: Secondary | ICD-10-CM | POA: Diagnosis not present

## 2023-10-18 DIAGNOSIS — J387 Other diseases of larynx: Secondary | ICD-10-CM | POA: Diagnosis not present

## 2023-10-18 DIAGNOSIS — R9431 Abnormal electrocardiogram [ECG] [EKG]: Secondary | ICD-10-CM | POA: Diagnosis not present

## 2023-10-18 DIAGNOSIS — E291 Testicular hypofunction: Secondary | ICD-10-CM | POA: Diagnosis not present

## 2023-10-18 DIAGNOSIS — J309 Allergic rhinitis, unspecified: Secondary | ICD-10-CM | POA: Diagnosis not present

## 2023-10-18 DIAGNOSIS — R0989 Other specified symptoms and signs involving the circulatory and respiratory systems: Secondary | ICD-10-CM | POA: Diagnosis not present

## 2023-10-18 DIAGNOSIS — N400 Enlarged prostate without lower urinary tract symptoms: Secondary | ICD-10-CM | POA: Diagnosis not present

## 2023-10-18 DIAGNOSIS — I10 Essential (primary) hypertension: Secondary | ICD-10-CM | POA: Diagnosis not present

## 2023-10-18 DIAGNOSIS — M8580 Other specified disorders of bone density and structure, unspecified site: Secondary | ICD-10-CM | POA: Diagnosis not present

## 2023-10-21 ENCOUNTER — Other Ambulatory Visit: Payer: Self-pay | Admitting: Internal Medicine

## 2023-10-21 DIAGNOSIS — R0989 Other specified symptoms and signs involving the circulatory and respiratory systems: Secondary | ICD-10-CM

## 2023-10-23 DIAGNOSIS — Z23 Encounter for immunization: Secondary | ICD-10-CM | POA: Diagnosis not present

## 2023-10-24 ENCOUNTER — Ambulatory Visit (HOSPITAL_COMMUNITY)
Admission: RE | Admit: 2023-10-24 | Discharge: 2023-10-24 | Disposition: A | Discharge status: Home / Self Care | Source: Ambulatory Visit | Attending: Vascular Surgery | Admitting: Vascular Surgery

## 2023-10-24 ENCOUNTER — Telehealth: Payer: Self-pay | Admitting: Cardiology

## 2023-10-24 DIAGNOSIS — R0989 Other specified symptoms and signs involving the circulatory and respiratory systems: Secondary | ICD-10-CM | POA: Diagnosis not present

## 2023-10-24 NOTE — Telephone Encounter (Signed)
 Patient stated he did his Carotid test today and wants Dr. Anner to review the results and advise him on next steps.

## 2023-10-24 NOTE — Telephone Encounter (Signed)
 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  Recommend vascular surgical consultation to talk about invasive options for treating these lesions.  Not sure if Dr. Coni office is already done this consult, but he if he has not, then we can go ahead and put the consult order in for a vascular surgical consultation for bilateral carotid artery disease.  Isaac Clay, MD

## 2023-10-28 ENCOUNTER — Encounter: Payer: Self-pay | Admitting: Surgery

## 2023-10-29 DIAGNOSIS — G5601 Carpal tunnel syndrome, right upper limb: Secondary | ICD-10-CM | POA: Diagnosis not present

## 2023-10-29 NOTE — Telephone Encounter (Signed)
 Office visit 11/04/23 with [facility]

## 2023-10-31 DIAGNOSIS — M25512 Pain in left shoulder: Secondary | ICD-10-CM | POA: Diagnosis not present

## 2023-11-04 ENCOUNTER — Encounter: Payer: Self-pay | Admitting: Surgery

## 2023-11-04 ENCOUNTER — Other Ambulatory Visit: Payer: Self-pay | Admitting: Surgery

## 2023-11-04 ENCOUNTER — Ambulatory Visit: Admitting: Surgery

## 2023-11-04 ENCOUNTER — Encounter: Payer: Self-pay | Admitting: Cardiology

## 2023-11-04 ENCOUNTER — Ambulatory Visit (HOSPITAL_COMMUNITY)
Admission: RE | Admit: 2023-11-04 | Discharge: 2023-11-04 | Disposition: A | Discharge status: Home / Self Care | Source: Ambulatory Visit | Attending: Surgery | Admitting: Surgery

## 2023-11-04 VITALS — BP 156/91 | HR 97 | Temp 98.1°F | Ht 70.0 in | Wt 175.0 lb

## 2023-11-04 DIAGNOSIS — I6502 Occlusion and stenosis of left vertebral artery: Secondary | ICD-10-CM | POA: Insufficient documentation

## 2023-11-04 DIAGNOSIS — I6523 Occlusion and stenosis of bilateral carotid arteries: Secondary | ICD-10-CM | POA: Diagnosis not present

## 2023-11-04 DIAGNOSIS — I6503 Occlusion and stenosis of bilateral vertebral arteries: Secondary | ICD-10-CM | POA: Diagnosis not present

## 2023-11-04 DIAGNOSIS — I672 Cerebral atherosclerosis: Secondary | ICD-10-CM | POA: Diagnosis not present

## 2023-11-04 DIAGNOSIS — I771 Stricture of artery: Secondary | ICD-10-CM | POA: Diagnosis not present

## 2023-11-04 MED ORDER — IOHEXOL 350 MG/ML SOLN
75.0000 mL | Freq: Once | INTRAVENOUS | Status: AC | PRN
  Administered 2023-11-04: 75 mL via INTRAVENOUS

## 2023-11-04 NOTE — Progress Notes (Signed)
 [facility] [place]  Patient name: [name] MRN: [id number] DOB: May 09, 1945 Sex: male   REQUESTING PROVIDER:    Dr. Clarice   REASON FOR CONSULT:    carotid  HISTORY OF PRESENT ILLNESS:   [name] is a 78 y.o. male, who is referred for evaluation of carotid disease.  He had an ultrasound that shows bilateral high-grade carotid stenosis.  He denies numbness in either upper or lower extremities.  He denies slurred speech.  He denies amaurosis fugax.  He did recently undergo carpal tunnel repair  He suffers from coronary disease, status post CABG in 2012.  He is medically managed for hypertension with an ARB.  He is on a statin for hypercholesterolemia, however he does not tolerate high-dose therapy.  He is a non-smoker.  He has a history of prostate cancer  PAST MEDICAL HISTORY    Past Medical History:  Diagnosis Date   Acquired trigger finger of both middle fingers    Acquired trigger finger of both ring fingers    Arthritis    CAD in native artery 08/2010   Cardiac CATH for Abn EKG- GXT portio of Myoview : Diffuse market Inferior ST depressions & STE in V1 and V2; NO scintigraphic evidence of ischemia  => CATH (MV CAD): 70% d LM, 90% ost LAD & 60-70% pLAD; Minimal LCx & p-d RCA but 99% ostrPDA ( BALANCED ISCHEMIA) => CABGx4 (LIMA-LAD, SVG-DiaG, SVG-OM, SVG-rPDA; Myoivew 11/2020 - EF 65-70%, No Ischemia/Infarction.   Cancer (HCC)    skin cancers-squamous cell / facial    Dyslipidemia - low HDL    Lipids monitored by Dr. Clarice.   GERD (gastroesophageal reflux disease)    History of multiple allergies    History of vertigo    Hypertension    Labile   Low testosterone     Recently stopped replacement therapy.   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   Urinary incontinence, urge    Wears glasses      FAMILY HISTORY   Family  History  Problem Relation Age of Onset   Cancer Father        RENAL CA -DIED AGE 7O   Heart failure Brother        ? heart dz.   Heart disease Brother    Prostate cancer Brother    Heart disease Maternal Grandfather    Colon cancer Maternal Aunt    Colon cancer Maternal Aunt    Breast cancer Neg Hx    Pancreatic cancer Neg Hx     SOCIAL HISTORY:   Social History   Socioeconomic History   Marital status: Married    Spouse name: Channing    Number of children: 2   Years of education: Not on file   Highest education level: Not on file  Occupational History    Comment: retired  Tobacco Use   Smoking status: Never   Smokeless tobacco: Former    Types: Chew    Quit date: 01/08/2005  Vaping Use   Vaping status: Never Used  Substance and Sexual Activity   Alcohol use: No   Drug use: No   Sexual activity: Yes  Other Topics Concern   Not on file  Social History Narrative   He is a married father of 2.   He does not smoke or drink alcohol.   Up  until recently when he hurt his left knee, he was continuing to exercise vigorously at least 2-3 times a week sometimes up to 2 hours of time doing cardiovascular and muscle strength exercises.   Social Drivers of Corporate Investment Banker Strain: Not on file  Food Insecurity: Not on file  Transportation Needs: Not on file  Physical Activity: Not on file  Stress: Not on file  Social Connections: Unknown (05/19/2021)   Received from [facility]   Social Network    Social Network: Not on file  Intimate Partner Violence: Not At Risk (01/21/2023)   Received from [facility]   HITS    Over the last 12 months how often did your partner physically hurt you?: Never    Over the last 12 months how often did your partner insult you or talk down to you?: Never    Over the last 12 months how often did your partner threaten you with physical harm?: Never    Over the last 12 months how often did your partner scream or curse at you?: Never     ALLERGIES:    Allergies  Allergen Reactions   Cephalosporins Palpitations   Triamterene-Hctz Palpitations   Statins Other (See Comments)     elevated muscle enzymes Lipitor   Vioxx [Rofecoxib] Swelling    Facial swelling   Zetia  [Ezetimibe ]     Increased muscle enzymes; GI upset   Lisinopril Cough   Sulfa Antibiotics Rash    CURRENT MEDICATIONS:    Current Outpatient Medications  Medication Sig Dispense Refill   acetaminophen  (TYLENOL ) 325 MG tablet Take 650 mg by mouth daily as needed for moderate pain or headache. Take 2  Daily as needed      aspirin  EC 81 MG tablet Take 81 mg by mouth daily. Swallow whole.     co-enzyme Q-10 30 MG capsule Take 30 mg by mouth 3 (three) times daily.     diphenhydrAMINE  (BENADRYL ) 25 MG tablet Take 25 mg by mouth daily as needed for allergies.      doxycycline (VIBRAMYCIN) 100 MG capsule Take 100 mg by mouth daily as needed (rosacea).      losartan  (COZAAR ) 50 MG tablet Take 50 mg by mouth daily.     metoprolol  tartrate (LOPRESSOR ) 25 MG tablet TAKE 1 TABLET TWICE A DAY 180 tablet 1   pantoprazole  (PROTONIX ) 40 MG tablet Take 40 mg by mouth daily.       rosuvastatin  (CRESTOR ) 5 MG tablet Take 5 mg by mouth at bedtime. Pt takes 5 mg daily at bedtime     sildenafil (REVATIO) 20 MG tablet Take 20 mg by mouth daily as needed.      losartan -hydrochlorothiazide  (HYZAAR) 100-12.5 MG tablet Take 1 tablet by mouth daily. (Patient not taking: Reported on 11/04/2023)     No current facility-administered medications for this visit.    REVIEW OF SYSTEMS:   [X]  denotes positive finding, [ ]  denotes negative finding Cardiac  Comments:  Chest pain or chest pressure:    Shortness of breath upon exertion:    Short of breath when lying flat:    Irregular heart rhythm:        Vascular    Pain in calf, thigh, or hip brought on by ambulation:    Pain in feet at night that wakes you up from your sleep:     Blood clot in your veins:    Leg swelling:          Pulmonary  Oxygen at home:    Productive cough:     Wheezing:         Neurologic    Sudden weakness in arms or legs:     Sudden numbness in arms or legs:     Sudden onset of difficulty speaking or slurred speech:    Temporary loss of vision in one eye:     Problems with dizziness:         Gastrointestinal    Blood in stool:      Vomited blood:         Genitourinary    Burning when urinating:     Blood in urine:        Psychiatric    Major depression:         Hematologic    Bleeding problems:    Problems with blood clotting too easily:        Skin    Rashes or ulcers:        Constitutional    Fever or chills:     PHYSICAL EXAM:   Vitals:   11/04/23 1143 11/04/23 1146  BP: (!) 168/88 (!) 156/91  Pulse: 97   Temp: 98.1 F (36.7 C)   Weight: 175 lb (79.4 kg)   Height: 5' 10 (1.778 m)     GENERAL: The patient is a well-nourished male, in no acute distress. The vital signs are documented above. CARDIAC: There is a regular rate and rhythm.  VASCULAR: Palpable pedal pulses PULMONARY: Nonlabored respirations ABDOMEN: Soft and non-tender   MUSCULOSKELETAL: There are no major deformities or cyanosis. NEUROLOGIC: No focal weakness or paresthesias are detected. SKIN: There are no ulcers or rashes noted. PSYCHIATRIC: The patient has a normal affect.  STUDIES:   I have reviewed the following: Right Carotid: Velocities in the right ICA are consistent with a 80-99%                 stenosis.   Left Carotid: Velocities in the left ICA are consistent with a 80-99%  stenosis.   Vertebrals: Bilateral vertebral arteries demonstrate antegrade flow.  Subclavians: Normal flow hemodynamics were seen in bilateral subclavian               arteries.   ASSESSMENT and PLAN   Carotid: The patient is asymptomatic.  He has high-grade bilateral stenosis.  In bilateral disease, ultrasound can be unreliable and so I have recommended getting a CT angiogram to document the  extent of his lesions and determine what the best option for treatment should be.  I will get this in the next week or 2 and he will follow-up afterwards.   Malvina Serene CLORE, MD, FACS [facility] [phone] Pager [phone]

## 2023-11-04 NOTE — H&P (View-Only) (Signed)
 [facility] [place]  Patient name: [name] MRN: [id number] DOB: May 09, 1945 Sex: male   REQUESTING PROVIDER:    Dr. Clarice   REASON FOR CONSULT:    carotid  HISTORY OF PRESENT ILLNESS:   [name] is a 78 y.o. male, who is referred for evaluation of carotid disease.  He had an ultrasound that shows bilateral high-grade carotid stenosis.  He denies numbness in either upper or lower extremities.  He denies slurred speech.  He denies amaurosis fugax.  He did recently undergo carpal tunnel repair  He suffers from coronary disease, status post CABG in 2012.  He is medically managed for hypertension with an ARB.  He is on a statin for hypercholesterolemia, however he does not tolerate high-dose therapy.  He is a non-smoker.  He has a history of prostate cancer  PAST MEDICAL HISTORY    Past Medical History:  Diagnosis Date   Acquired trigger finger of both middle fingers    Acquired trigger finger of both ring fingers    Arthritis    CAD in native artery 08/2010   Cardiac CATH for Abn EKG- GXT portio of Myoview : Diffuse market Inferior ST depressions & STE in V1 and V2; NO scintigraphic evidence of ischemia  => CATH (MV CAD): 70% d LM, 90% ost LAD & 60-70% pLAD; Minimal LCx & p-d RCA but 99% ostrPDA ( BALANCED ISCHEMIA) => CABGx4 (LIMA-LAD, SVG-DiaG, SVG-OM, SVG-rPDA; Myoivew 11/2020 - EF 65-70%, No Ischemia/Infarction.   Cancer (HCC)    skin cancers-squamous cell / facial    Dyslipidemia - low HDL    Lipids monitored by Dr. Clarice.   GERD (gastroesophageal reflux disease)    History of multiple allergies    History of vertigo    Hypertension    Labile   Low testosterone     Recently stopped replacement therapy.   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   Urinary incontinence, urge    Wears glasses      FAMILY HISTORY   Family  History  Problem Relation Age of Onset   Cancer Father        RENAL CA -DIED AGE 7O   Heart failure Brother        ? heart dz.   Heart disease Brother    Prostate cancer Brother    Heart disease Maternal Grandfather    Colon cancer Maternal Aunt    Colon cancer Maternal Aunt    Breast cancer Neg Hx    Pancreatic cancer Neg Hx     SOCIAL HISTORY:   Social History   Socioeconomic History   Marital status: Married    Spouse name: Channing    Number of children: 2   Years of education: Not on file   Highest education level: Not on file  Occupational History    Comment: retired  Tobacco Use   Smoking status: Never   Smokeless tobacco: Former    Types: Chew    Quit date: 01/08/2005  Vaping Use   Vaping status: Never Used  Substance and Sexual Activity   Alcohol use: No   Drug use: No   Sexual activity: Yes  Other Topics Concern   Not on file  Social History Narrative   He is a married father of 2.   He does not smoke or drink alcohol.   Up  until recently when he hurt his left knee, he was continuing to exercise vigorously at least 2-3 times a week sometimes up to 2 hours of time doing cardiovascular and muscle strength exercises.   Social Drivers of Corporate Investment Banker Strain: Not on file  Food Insecurity: Not on file  Transportation Needs: Not on file  Physical Activity: Not on file  Stress: Not on file  Social Connections: Unknown (05/19/2021)   Received from [facility]   Social Network    Social Network: Not on file  Intimate Partner Violence: Not At Risk (01/21/2023)   Received from [facility]   HITS    Over the last 12 months how often did your partner physically hurt you?: Never    Over the last 12 months how often did your partner insult you or talk down to you?: Never    Over the last 12 months how often did your partner threaten you with physical harm?: Never    Over the last 12 months how often did your partner scream or curse at you?: Never     ALLERGIES:    Allergies  Allergen Reactions   Cephalosporins Palpitations   Triamterene-Hctz Palpitations   Statins Other (See Comments)     elevated muscle enzymes Lipitor   Vioxx [Rofecoxib] Swelling    Facial swelling   Zetia  [Ezetimibe ]     Increased muscle enzymes; GI upset   Lisinopril Cough   Sulfa Antibiotics Rash    CURRENT MEDICATIONS:    Current Outpatient Medications  Medication Sig Dispense Refill   acetaminophen  (TYLENOL ) 325 MG tablet Take 650 mg by mouth daily as needed for moderate pain or headache. Take 2  Daily as needed      aspirin  EC 81 MG tablet Take 81 mg by mouth daily. Swallow whole.     co-enzyme Q-10 30 MG capsule Take 30 mg by mouth 3 (three) times daily.     diphenhydrAMINE  (BENADRYL ) 25 MG tablet Take 25 mg by mouth daily as needed for allergies.      doxycycline (VIBRAMYCIN) 100 MG capsule Take 100 mg by mouth daily as needed (rosacea).      losartan  (COZAAR ) 50 MG tablet Take 50 mg by mouth daily.     metoprolol  tartrate (LOPRESSOR ) 25 MG tablet TAKE 1 TABLET TWICE A DAY 180 tablet 1   pantoprazole  (PROTONIX ) 40 MG tablet Take 40 mg by mouth daily.       rosuvastatin  (CRESTOR ) 5 MG tablet Take 5 mg by mouth at bedtime. Pt takes 5 mg daily at bedtime     sildenafil (REVATIO) 20 MG tablet Take 20 mg by mouth daily as needed.      losartan -hydrochlorothiazide  (HYZAAR) 100-12.5 MG tablet Take 1 tablet by mouth daily. (Patient not taking: Reported on 11/04/2023)     No current facility-administered medications for this visit.    REVIEW OF SYSTEMS:   [X]  denotes positive finding, [ ]  denotes negative finding Cardiac  Comments:  Chest pain or chest pressure:    Shortness of breath upon exertion:    Short of breath when lying flat:    Irregular heart rhythm:        Vascular    Pain in calf, thigh, or hip brought on by ambulation:    Pain in feet at night that wakes you up from your sleep:     Blood clot in your veins:    Leg swelling:          Pulmonary  Oxygen at home:    Productive cough:     Wheezing:         Neurologic    Sudden weakness in arms or legs:     Sudden numbness in arms or legs:     Sudden onset of difficulty speaking or slurred speech:    Temporary loss of vision in one eye:     Problems with dizziness:         Gastrointestinal    Blood in stool:      Vomited blood:         Genitourinary    Burning when urinating:     Blood in urine:        Psychiatric    Major depression:         Hematologic    Bleeding problems:    Problems with blood clotting too easily:        Skin    Rashes or ulcers:        Constitutional    Fever or chills:     PHYSICAL EXAM:   Vitals:   11/04/23 1143 11/04/23 1146  BP: (!) 168/88 (!) 156/91  Pulse: 97   Temp: 98.1 F (36.7 C)   Weight: 175 lb (79.4 kg)   Height: 5' 10 (1.778 m)     GENERAL: The patient is a well-nourished male, in no acute distress. The vital signs are documented above. CARDIAC: There is a regular rate and rhythm.  VASCULAR: Palpable pedal pulses PULMONARY: Nonlabored respirations ABDOMEN: Soft and non-tender   MUSCULOSKELETAL: There are no major deformities or cyanosis. NEUROLOGIC: No focal weakness or paresthesias are detected. SKIN: There are no ulcers or rashes noted. PSYCHIATRIC: The patient has a normal affect.  STUDIES:   I have reviewed the following: Right Carotid: Velocities in the right ICA are consistent with a 80-99%                 stenosis.   Left Carotid: Velocities in the left ICA are consistent with a 80-99%  stenosis.   Vertebrals: Bilateral vertebral arteries demonstrate antegrade flow.  Subclavians: Normal flow hemodynamics were seen in bilateral subclavian               arteries.   ASSESSMENT and PLAN   Carotid: The patient is asymptomatic.  He has high-grade bilateral stenosis.  In bilateral disease, ultrasound can be unreliable and so I have recommended getting a CT angiogram to document the  extent of his lesions and determine what the best option for treatment should be.  I will get this in the next week or 2 and he will follow-up afterwards.   Malvina Serene CLORE, MD, FACS [facility] [phone] Pager [phone]

## 2023-11-11 ENCOUNTER — Other Ambulatory Visit: Payer: Self-pay

## 2023-11-11 ENCOUNTER — Encounter: Payer: Self-pay | Admitting: Surgery

## 2023-11-11 ENCOUNTER — Ambulatory Visit: Attending: Surgery | Admitting: Surgery

## 2023-11-11 ENCOUNTER — Telehealth (HOSPITAL_BASED_OUTPATIENT_CLINIC_OR_DEPARTMENT_OTHER): Payer: Self-pay

## 2023-11-11 VITALS — BP 170/98 | HR 74 | Temp 98.7°F | Ht 70.0 in | Wt 176.0 lb

## 2023-11-11 DIAGNOSIS — I6523 Occlusion and stenosis of bilateral carotid arteries: Secondary | ICD-10-CM | POA: Insufficient documentation

## 2023-11-11 NOTE — Telephone Encounter (Signed)
   Pre-operative Risk Assessment    Patient Name: [name]  DOB: 08-Jan-1946 MRN: [id number]   Date of last office visit: Telephone Visit 07/02/23 with Katlyn West, NP and 12/25/2022 In-Person OV with Josefa Beauvais, NP Date of next office visit: 12/20/2023 with Dr. Anner   Request for Surgical Clearance    Procedure:  Left Carotid Endarterectomy   Date of Surgery:  Clearance 11/20/23      ASAP Please                           Surgeon:  Dr. Malvina New Surgeon's Group or Practice Name:  [facility] Phone number:  [phone] Fax number:  [phone]    Type of Clearance Requested:   - Medical  - Pharmacy:  Hold Aspirin  Not indicated   Type of Anesthesia:  Not specified   Additional requests/questions:  N/A  SignedPatrcia Iverson CROME   11/11/2023, 5:50 PM

## 2023-11-11 NOTE — Progress Notes (Signed)
 [facility]   Patient name: [name] MRN: [id number] DOB: 1945/10/28 Sex: male   REASON FOR VISIT:    Follow-up  HISOTRY OF PRESENT ILLNESS:    [name] is a 78 y.o. male who I saw recently for evaluation of carotid disease.  He had a ultrasound that showed bilateral high-grade lesions.  He was asymptomatic.  Specifically, he denies numbness or weakness in either extremity.  He denies slurred speech.  He denies amaurosis fugax.  Because of the bilateral high-grade lesions I sent him for a CT scan to better define his anatomy.  He suffers from coronary disease, status post CABG in 2012. He is medically managed for hypertension with an ARB. He is on a statin for hypercholesterolemia, however he does not tolerate high-dose therapy. He is a non-smoker. He has a history of prostate cancer  PAST MEDICAL HISTORY:   Past Medical History:  Diagnosis Date   Acquired trigger finger of both middle fingers    Acquired trigger finger of both ring fingers    Arthritis    CAD in native artery 08/2010   Cardiac CATH for Abn EKG- GXT portio of Myoview : Diffuse market Inferior ST depressions & STE in V1 and V2; NO scintigraphic evidence of ischemia  => CATH (MV CAD): 70% d LM, 90% ost LAD & 60-70% pLAD; Minimal LCx & p-d RCA but 99% ostrPDA ( BALANCED ISCHEMIA) => CABGx4 (LIMA-LAD, SVG-DiaG, SVG-OM, SVG-rPDA; Myoivew 11/2020 - EF 65-70%, No Ischemia/Infarction.   Cancer (HCC)    skin cancers-squamous cell / facial    Dyslipidemia - low HDL    Lipids monitored by Dr. Clarice.   GERD (gastroesophageal reflux disease)    History of multiple allergies    History of vertigo    Hypertension    Labile   Low testosterone     Recently stopped replacement therapy.   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   Urinary incontinence, urge    Wears glasses       FAMILY HISTORY:   Family History  Problem Relation Age of Onset   Cancer Father        RENAL CA -DIED AGE 7O   Heart failure Brother        ? heart dz.   Heart disease Brother    Prostate cancer Brother    Heart disease Maternal Grandfather    Colon cancer Maternal Aunt    Colon cancer Maternal Aunt    Breast cancer Neg Hx    Pancreatic cancer Neg Hx     SOCIAL HISTORY:   Social History   Tobacco Use   Smoking status: Never   Smokeless tobacco: Former    Types: Chew    Quit date: 01/08/2005  Substance Use Topics   Alcohol use: No     ALLERGIES:   Allergies  Allergen Reactions   Cephalosporins Palpitations   Triamterene-Hctz Palpitations   Statins Other (See Comments)     elevated muscle enzymes Lipitor   Vioxx [Rofecoxib] Swelling    Facial swelling   Zetia  [Ezetimibe ]     Increased muscle enzymes; GI upset   Lisinopril Cough   Sulfa Antibiotics Rash     CURRENT MEDICATIONS:   Current Outpatient Medications  Medication Sig Dispense Refill   acetaminophen  (TYLENOL ) 325 MG tablet Take 650 mg by mouth daily as needed for  moderate pain or headache. Take 2  Daily as needed      aspirin  EC 81 MG tablet Take 81 mg by mouth daily. Swallow whole.     co-enzyme Q-10 30 MG capsule Take 30 mg by mouth 3 (three) times daily.     diphenhydrAMINE  (BENADRYL ) 25 MG tablet Take 25 mg by mouth daily as needed for allergies.      doxycycline (VIBRAMYCIN) 100 MG capsule Take 100 mg by mouth daily as needed (rosacea).      losartan  (COZAAR ) 50 MG tablet Take 50 mg by mouth daily.     losartan -hydrochlorothiazide  (HYZAAR) 100-12.5 MG tablet Take 1 tablet by mouth daily.     metoprolol  tartrate (LOPRESSOR ) 25 MG tablet TAKE 1 TABLET TWICE A DAY 180 tablet 1   pantoprazole  (PROTONIX ) 40 MG tablet Take 40 mg by mouth daily.       rosuvastatin  (CRESTOR ) 5 MG tablet Take 5 mg by mouth at bedtime. Pt takes 5 mg daily at bedtime     sildenafil (REVATIO) 20 MG tablet Take 20 mg  by mouth daily as needed.      No current facility-administered medications for this visit.    REVIEW OF SYSTEMS:   [X]  denotes positive finding, [ ]  denotes negative finding Cardiac  Comments:  Chest pain or chest pressure:    Shortness of breath upon exertion:    Short of breath when lying flat:    Irregular heart rhythm:        Vascular    Pain in calf, thigh, or hip brought on by ambulation:    Pain in feet at night that wakes you up from your sleep:     Blood clot in your veins:    Leg swelling:         Pulmonary    Oxygen at home:    Productive cough:     Wheezing:         Neurologic    Sudden weakness in arms or legs:     Sudden numbness in arms or legs:     Sudden onset of difficulty speaking or slurred speech:    Temporary loss of vision in one eye:     Problems with dizziness:         Gastrointestinal    Blood in stool:     Vomited blood:         Genitourinary    Burning when urinating:     Blood in urine:        Psychiatric    Major depression:         Hematologic    Bleeding problems:    Problems with blood clotting too easily:        Skin    Rashes or ulcers:        Constitutional    Fever or chills:      PHYSICAL EXAM:   Vitals:   11/11/23 1441 11/11/23 1444  BP: (!) 160/94 (!) 170/98  Pulse: 74   Temp: 98.7 F (37.1 C)   SpO2: 97%   Weight: 176 lb (79.8 kg)   Height: 5' 10 (1.778 m)     GENERAL: The patient is a well-nourished male, in no acute distress. The vital signs are documented above. CARDIAC: There is a regular rate and rhythm. PULMONARY: Non-labored respirations MUSCULOSKELETAL: There are no major deformities or cyanosis. NEUROLOGIC: No focal weakness or paresthesias are detected. SKIN: There are no ulcers or rashes noted. PSYCHIATRIC: The patient has  a normal affect.  STUDIES:   I have reviewed his CT scan with the following findings: 1. Positive High-grade ICA origin/bulb stenoses in the neck with  BILATERAL Radiographic-STRING-Signs. Both ICAs patent but with asymmetric diminished LICA distal caliber and enhancement. And evidence of left > right ICA siphon atherosclerosis also. 2. Advanced atherosclerosis also of both Vertebral Arteries which remain patent. Severe stenoses of the dominant left vertebral artery origin, and the nondominant distal right vertebral V4. Moderate additional distal left artery V4 segment.  MEDICAL ISSUES:   Asymptomatic bilateral carotid stenosis, left greater than right: I discussed the treatment options with the patient and his wife including medical management, stenting, and endarterectomy.  I went over the details and pros and cons of each option.  Ultimately he would like to proceed with carotid endarterectomy as he does not want the plaque to remain.  I talked about the risk of stroke, nerve injury, and healing as well as the recovery process.  He wants to proceed as soon as possible.  Once he recovers from the left side we will need to do his right given the string sign on both sides.  He does see cardiology and so I will try to have them expedite his appointment however I think treating his carotid disease is pressing.  I have scheduled him for Wednesday, November 12    Malvina Serene CLORE, MD, [facility] [phone] Pager [phone]

## 2023-11-12 NOTE — Telephone Encounter (Signed)
 S/w the pt and his wife and they agreed to in office appt 11/15/23 Reche Finder, NP at [facility] @ 8:50 for preop clearance.

## 2023-11-12 NOTE — Telephone Encounter (Signed)
   Name:  [name]  DOB:  Dec 23, 1945  MRN:  [id number]   Primary Cardiologist: Alm Clay, MD  Chart reviewed as part of pre-operative protocol coverage. Patient was contacted 11/12/2023 in reference to pre-operative risk assessment for pending surgery as outlined below.  [name] was last seen on 12/25/2022 by Josefa Beauvais, NP.    Due to high risk surgery and time since last visit, [name] will require a follow-up visit for further pre-operative risk assessment.  Pre-op covering staff: - Please schedule appointment and call patient to inform them. If patient already had an upcoming appointment within acceptable timeframe, please add pre-op clearance to the appointment notes so provider is aware. - Please contact requesting surgeon's office via preferred method (i.e, phone, fax) to inform them of need for appointment prior to surgery.  Rosaline EMERSON Bane, NP-C 11/12/2023, 7:52 AM [address] [place], [place] [address] Office [phone] Fax [phone]

## 2023-11-13 ENCOUNTER — Encounter (HOSPITAL_BASED_OUTPATIENT_CLINIC_OR_DEPARTMENT_OTHER): Payer: Self-pay

## 2023-11-14 NOTE — Progress Notes (Signed)
 Surgical Instructions   Your procedure is scheduled on November 20, 2023. Report to [facility] Main Entrance A at 9:00 A.M., then check in with the Admitting office. Any questions or running late day of surgery: call [phone]  Questions prior to your surgery date: call [phone], Monday-Friday, 8am-4pm. If you experience any cold or flu symptoms such as cough, fever, chills, shortness of breath, etc. between now and your scheduled surgery, please notify us  at the above number.     Remember:  Do not eat or drink after midnight the night before your surgery       Take these medicines the morning of surgery with A SIP OF WATER  aspirin   famotidine (PEPCID)  metoprolol  tartrate (LOPRESSOR )  pantoprazole  (PROTONIX )    May take these medicines IF NEEDED: ACETAMINOPHEN   diazepam (VALIUM)  ipratropium (ATROVENT)  nasal spray  traMADol  (ULTRAM )   One week prior to surgery, STOP taking any Aspirin  (unless otherwise instructed by your surgeon) Aleve, Naproxen, Ibuprofen, Motrin, Advil, Goody's, BC's, all herbal medications, fish oil, and non-prescription vitamins.                     Do NOT Smoke (Tobacco/Vaping) for 24 hours prior to your procedure.  If you use a CPAP at night, you may bring your mask/headgear for your overnight stay.   You will be asked to remove any contacts, glasses, piercing's, hearing aid's, dentures/partials prior to surgery. Please bring cases for these items if needed.    Patients discharged the day of surgery will not be allowed to drive home, and someone needs to stay with them for 24 hours.  SURGICAL WAITING ROOM VISITATION Patients may have no more than 2 support people in the waiting area - these visitors may rotate.   Pre-op nurse will coordinate an appropriate time for 1 ADULT support person, who may not rotate, to accompany patient in pre-op.  Children under the age of 38 must have an adult with them who is not the patient and must remain  in the main waiting area with an adult.  If the patient needs to stay at the hospital during part of their recovery, the visitor guidelines for inpatient rooms apply.  Please refer to the [facility] website for the visitor guidelines for any additional information.   If you received a COVID test during your pre-op visit  it is requested that you wear a mask when out in public, stay away from anyone that may not be feeling well and notify your surgeon if you develop symptoms. If you have been in contact with anyone that has tested positive in the last 10 days please notify you surgeon.      Pre-operative CHG Bathing Instructions   You can play a key role in reducing the risk of infection after surgery. Your skin needs to be as free of germs as possible. You can reduce the number of germs on your skin by washing with CHG (chlorhexidine  gluconate) soap before surgery. CHG is an antiseptic soap that kills germs and continues to kill germs even after washing.   DO NOT use if you have an allergy to chlorhexidine /CHG or antibacterial soaps. If your skin becomes reddened or irritated, stop using the CHG and notify one of our RNs at [phone].              TAKE A SHOWER THE NIGHT BEFORE SURGERY   Please keep in mind the following:  DO NOT shave, including legs and  underarms, 48 hours prior to surgery.   You may shave your face before/day of surgery.  Place clean sheets on your bed the night before surgery Use a clean washcloth (not used since being washed) for shower. DO NOT sleep with pet's night before surgery.  CHG Shower Instructions:  Wash your face and private area with normal soap. If you choose to wash your hair, wash first with your normal shampoo.  After you use shampoo/soap, rinse your hair and body thoroughly to remove shampoo/soap residue.  Turn the water OFF and apply half the bottle of CHG soap to a CLEAN washcloth.  Apply CHG soap ONLY FROM YOUR NECK DOWN TO YOUR TOES  (washing for 3-5 minutes)  DO NOT use CHG soap on face, private areas, open wounds, or sores.  Pay special attention to the area where your surgery is being performed.  If you are having back surgery, having someone wash your back for you may be helpful. Wait 2 minutes after CHG soap is applied, then you may rinse off the CHG soap.  Pat dry with a clean towel  Put on clean pajamas    Additional instructions for the day of surgery: If you choose, you may shower the morning of surgery with an antibacterial soap.  DO NOT APPLY any lotions, deodorants, cologne, or perfumes.   Do not wear jewelry or makeup Do not wear nail polish, gel polish, artificial nails, or any other type of covering on natural nails (fingers and toes) Do not bring valuables to the hospital. [facility] is not responsible for valuables/personal belongings. Put on clean/comfortable clothes.  Please brush your teeth.  Ask your nurse before applying any prescription medications to the skin.

## 2023-11-15 ENCOUNTER — Encounter (HOSPITAL_BASED_OUTPATIENT_CLINIC_OR_DEPARTMENT_OTHER): Payer: Self-pay | Admitting: Family

## 2023-11-15 ENCOUNTER — Encounter (HOSPITAL_COMMUNITY)
Admission: RE | Admit: 2023-11-15 | Discharge: 2023-11-15 | Disposition: A | Discharge status: Home / Self Care | Source: Ambulatory Visit | Attending: Surgery | Admitting: Surgery

## 2023-11-15 ENCOUNTER — Other Ambulatory Visit: Payer: Self-pay

## 2023-11-15 ENCOUNTER — Ambulatory Visit (INDEPENDENT_AMBULATORY_CARE_PROVIDER_SITE_OTHER): Admitting: Family

## 2023-11-15 ENCOUNTER — Encounter (HOSPITAL_COMMUNITY): Payer: Self-pay

## 2023-11-15 VITALS — BP 161/72 | HR 75 | Temp 97.8°F | Resp 18 | Ht 70.0 in | Wt 176.8 lb

## 2023-11-15 VITALS — BP 130/70 | HR 74 | Ht 70.0 in | Wt 176.3 lb

## 2023-11-15 DIAGNOSIS — E785 Hyperlipidemia, unspecified: Secondary | ICD-10-CM | POA: Insufficient documentation

## 2023-11-15 DIAGNOSIS — K219 Gastro-esophageal reflux disease without esophagitis: Secondary | ICD-10-CM | POA: Diagnosis not present

## 2023-11-15 DIAGNOSIS — I6523 Occlusion and stenosis of bilateral carotid arteries: Secondary | ICD-10-CM | POA: Insufficient documentation

## 2023-11-15 DIAGNOSIS — I25118 Atherosclerotic heart disease of native coronary artery with other forms of angina pectoris: Secondary | ICD-10-CM | POA: Diagnosis not present

## 2023-11-15 DIAGNOSIS — Z01810 Encounter for preprocedural cardiovascular examination: Secondary | ICD-10-CM

## 2023-11-15 DIAGNOSIS — Z01812 Encounter for preprocedural laboratory examination: Secondary | ICD-10-CM | POA: Insufficient documentation

## 2023-11-15 DIAGNOSIS — I10 Essential (primary) hypertension: Secondary | ICD-10-CM | POA: Diagnosis not present

## 2023-11-15 DIAGNOSIS — I2510 Atherosclerotic heart disease of native coronary artery without angina pectoris: Secondary | ICD-10-CM | POA: Diagnosis not present

## 2023-11-15 DIAGNOSIS — Z87891 Personal history of nicotine dependence: Secondary | ICD-10-CM | POA: Insufficient documentation

## 2023-11-15 DIAGNOSIS — I4510 Unspecified right bundle-branch block: Secondary | ICD-10-CM | POA: Insufficient documentation

## 2023-11-15 DIAGNOSIS — Z01818 Encounter for other preprocedural examination: Secondary | ICD-10-CM

## 2023-11-15 DIAGNOSIS — Z951 Presence of aortocoronary bypass graft: Secondary | ICD-10-CM | POA: Diagnosis not present

## 2023-11-15 HISTORY — DX: COVID-19: U07.1

## 2023-11-15 LAB — URINALYSIS, ROUTINE W REFLEX MICROSCOPIC
Bacteria, UA: NONE SEEN
Bilirubin Urine: NEGATIVE
Glucose, UA: NEGATIVE mg/dL
Ketones, ur: NEGATIVE mg/dL
Leukocytes,Ua: NEGATIVE
Nitrite: NEGATIVE
Protein, ur: NEGATIVE mg/dL
Specific Gravity, Urine: 1.01 (ref 1.005–1.030)
pH: 6 (ref 5.0–8.0)

## 2023-11-15 LAB — COMPREHENSIVE METABOLIC PANEL WITH GFR
ALT: 23 U/L (ref 0–44)
AST: 25 U/L (ref 15–41)
Albumin: 3.9 g/dL (ref 3.5–5.0)
Alkaline Phosphatase: 71 U/L (ref 38–126)
Anion gap: 13 (ref 5–15)
BUN: 15 mg/dL (ref 8–23)
CO2: 28 mmol/L (ref 22–32)
Calcium: 9 mg/dL (ref 8.9–10.3)
Chloride: 97 mmol/L — ABNORMAL LOW (ref 98–111)
Creatinine, Ser: 1.35 mg/dL — ABNORMAL HIGH (ref 0.61–1.24)
GFR, Estimated: 54 mL/min — ABNORMAL LOW (ref >60)
Glucose, Bld: 106 mg/dL — ABNORMAL HIGH (ref 70–99)
Potassium: 4.4 mmol/L (ref 3.5–5.1)
Sodium: 138 mmol/L (ref 135–145)
Total Bilirubin: 0.6 mg/dL (ref 0.0–1.2)
Total Protein: 6.3 g/dL — ABNORMAL LOW (ref 6.5–8.1)

## 2023-11-15 LAB — SURGICAL PCR SCREEN
MRSA, PCR: NEGATIVE
Staphylococcus aureus: NEGATIVE

## 2023-11-15 LAB — TYPE AND SCREEN
ABO/RH(D): O POS
Antibody Screen: NEGATIVE

## 2023-11-15 LAB — PROTIME-INR
INR: 1 (ref 0.8–1.2)
Prothrombin Time: 13.9 s (ref 11.4–15.2)

## 2023-11-15 LAB — CBC
HCT: 46.3 % (ref 39.0–52.0)
Hemoglobin: 15.5 g/dL (ref 13.0–17.0)
MCH: 29.3 pg (ref 26.0–34.0)
MCHC: 33.5 g/dL (ref 30.0–36.0)
MCV: 87.5 fL (ref 80.0–100.0)
Platelets: 224 K/uL (ref 150–400)
RBC: 5.29 MIL/uL (ref 4.22–5.81)
RDW: 13.1 % (ref 11.5–15.5)
WBC: 6.3 K/uL (ref 4.0–10.5)
nRBC: 0 % (ref 0.0–0.2)

## 2023-11-15 LAB — APTT: aPTT: 28 s (ref 24–36)

## 2023-11-15 NOTE — Progress Notes (Signed)
 Surgical Instructions   Your procedure is scheduled on November 20, 2023. Report to [facility] Main Entrance A at 9:00 A.M., then check in with the Admitting office. Any questions or running late day of surgery: call [phone]  Questions prior to your surgery date: call [phone], Monday-Friday, 8am-4pm. If you experience any cold or flu symptoms such as cough, fever, chills, shortness of breath, etc. between now and your scheduled surgery, please notify us  at the above number.     Remember:  Do not eat or drink after midnight the night before your surgery       Take these medicines the morning of surgery with A SIP OF WATER  aspirin   famotidine (PEPCID)  metoprolol  tartrate (LOPRESSOR )  pantoprazole  (PROTONIX )    May take these medicines IF NEEDED: ACETAMINOPHEN   diazepam (VALIUM)  ipratropium (ATROVENT)  nasal spray  traMADol  (ULTRAM )   One week prior to surgery, STOP taking any Aleve, Naproxen, Ibuprofen, Motrin, Advil, Goody's, BC's, all herbal medications, fish oil, and non-prescription vitamins.                     Do NOT Smoke (Tobacco/Vaping) for 24 hours prior to your procedure.  If you use a CPAP at night, you may bring your mask/headgear for your overnight stay.   You will be asked to remove any contacts, glasses, piercing's, hearing aid's, dentures/partials prior to surgery. Please bring cases for these items if needed.    Patients discharged the day of surgery will not be allowed to drive home, and someone needs to stay with them for 24 hours.  SURGICAL WAITING ROOM VISITATION Patients may have no more than 2 support people in the waiting area - these visitors may rotate.   Pre-op nurse will coordinate an appropriate time for 1 ADULT support person, who may not rotate, to accompany patient in pre-op.  Children under the age of 26 must have an adult with them who is not the patient and must remain in the main waiting area with an adult.  If the  patient needs to stay at the hospital during part of their recovery, the visitor guidelines for inpatient rooms apply.  Please refer to the [facility] website for the visitor guidelines for any additional information.   If you received a COVID test during your pre-op visit  it is requested that you wear a mask when out in public, stay away from anyone that may not be feeling well and notify your surgeon if you develop symptoms. If you have been in contact with anyone that has tested positive in the last 10 days please notify you surgeon.      Pre-operative CHG Bathing Instructions   You can play a key role in reducing the risk of infection after surgery. Your skin needs to be as free of germs as possible. You can reduce the number of germs on your skin by washing with CHG (chlorhexidine  gluconate) soap before surgery. CHG is an antiseptic soap that kills germs and continues to kill germs even after washing.   DO NOT use if you have an allergy to chlorhexidine /CHG or antibacterial soaps. If your skin becomes reddened or irritated, stop using the CHG and notify one of our RNs at [phone].              TAKE A SHOWER THE NIGHT BEFORE SURGERY   Please keep in mind the following:  DO NOT shave, including legs and underarms, 48 hours prior to surgery.  You may shave your face before/day of surgery.  Place clean sheets on your bed the night before surgery Use a clean washcloth (not used since being washed) for shower. DO NOT sleep with pet's night before surgery.  CHG Shower Instructions:  Wash your face and private area with normal soap. If you choose to wash your hair, wash first with your normal shampoo.  After you use shampoo/soap, rinse your hair and body thoroughly to remove shampoo/soap residue.  Turn the water OFF and apply half the bottle of CHG soap to a CLEAN washcloth.  Apply CHG soap ONLY FROM YOUR NECK DOWN TO YOUR TOES (washing for 3-5 minutes)  DO NOT use CHG soap on face,  private areas, open wounds, or sores.  Pay special attention to the area where your surgery is being performed.  If you are having back surgery, having someone wash your back for you may be helpful. Wait 2 minutes after CHG soap is applied, then you may rinse off the CHG soap.  Pat dry with a clean towel  Put on clean pajamas    Additional instructions for the day of surgery: If you choose, you may shower the morning of surgery with an antibacterial soap.  DO NOT APPLY any lotions, deodorants, cologne, or perfumes.   Do not wear jewelry or makeup Do not wear nail polish, gel polish, artificial nails, or any other type of covering on natural nails (fingers and toes) Do not bring valuables to the hospital. [facility] is not responsible for valuables/personal belongings. Put on clean/comfortable clothes.  Please brush your teeth.  Ask your nurse before applying any prescription medications to the skin.

## 2023-11-15 NOTE — Progress Notes (Signed)
 PCP -    Clarice Nottingham, MD   Cardiologist - Dr Alm Clay  PPM/ICD - denies   Chest x-ray - N/A EKG - 11/15/23- done at cardiologist office Stress Test - 1/12/5 ECHO - 01/27/20 Cardiac Cath - 08/18/2010  Sleep Study - denies   Fasting Blood Sugar - N/A  Last dose of GLP1 agonist-  N/A   Blood Thinner Instructions: N/A Aspirin  Instructions: continue ASA  ERAS Protcol - NPO   COVID TEST- N/A   Anesthesia review: yes- hx CAD, pt saw cardiology for clearance this morning (11/15/23)  Patient denies shortness of breath, fever, cough and chest pain at PAT appointment   All instructions explained to the patient, with a verbal understanding of the material. Patient agrees to go over the instructions while at home for a better understanding.  The opportunity to ask questions was provided.

## 2023-11-15 NOTE — Patient Instructions (Signed)
 Medication Instructions:  Continue your current medications.  *If you need a refill on your cardiac medications before your next appointment, please call your pharmacy*   Follow-Up: At [facility], you and your health needs are our priority.  As part of our continuing mission to provide you with exceptional heart care, our providers are all part of one team.  This team includes your primary Cardiologist (physician) and Advanced Practice Providers or APPs (Physician Assistants and Nurse Practitioners) who all work together to provide you with the care you need, when you need it.  Your next appointment:   As scheduled with Dr. Anner   We recommend signing up for the patient portal called MyChart.  Sign up information is provided on this After Visit Summary.  MyChart is used to connect with patients for Virtual Visits (Telemedicine).  Patients are able to view lab/test results, encounter notes, upcoming appointments, etc.  Non-urgent messages can be sent to your provider as well.   To learn more about what you can do with MyChart, go to [email or web address].   Other Instructions  Reche GORMAN Finder, NP will let Dr. Serene know that you are good to go for surgery!

## 2023-11-15 NOTE — Progress Notes (Signed)
 Cardiology Office Note   Date:  11/15/2023  ID:  [name], DOB [birth year], MRN [id number] PCP: Clarice Nottingham, MD  [facility] Providers Cardiologist:  Alm Clay, MD     History of Present Illness [name] is a 77 y.o. male with hx of labile HTN, CAD s/p CABG x4 (LIMA-LAD, SVG-diag, SVG-OM, SVG-rPDA) in 2012, HLD, prostate CA, dizziness. Most recent myoview  11/2020 low risk with normal LVEF 67%.   Presents today with his wife for preop clearance for left carotid endarterectomy. He notes there is a plan for right carotid, pending outcome and recovery from this surgery. He recently underwent carpal tunnel surgery of right hand and is wearing bandage. He has had left shoulder surgery this year as well. He is very active with golf, yard work, working in his garage. He denies chest pain, shortness of breath, lightheadedness, dizziness, edema. Previously was lightheaded with orthostatic symptoms on higher dose losartan -hydrochlorothiazide , but this has resolved since medication change to losartan  only by PCP. Notes right pedal edema secondary to vein graft harvest. He has an appointment and repeat fasting labs with PCP early December.    Labs 10/16/23: TC:162 Trigs: 165 HDL: 44 LDL: 89 ALT: 19 AST: 23  Previous lipid lowering agents Lipitor - elevated liver enzymes Zetia  - did not tolerate, GI upset  ROS: Please see the history of present illness.    All other systems reviewed and are negative.   Studies Reviewed EKG Interpretation Date/Time:  Friday November 15 2023 08:51:07 EST Ventricular Rate:  69 PR Interval:  186 QRS Duration:  104 QT Interval:  392 QTC Calculation: 420 R Axis:   57  Text Interpretation: Normal sinus rhythm Incomplete right bundle branch block  Stable lateral TWI Confirmed by Vannie Mora ([id number]) on 11/15/2023 9:20:13 AM    Cardiac Studies & Procedures    ______________________________________________________________________________________________   STRESS TESTS  MYOCARDIAL PERFUSION IMAGING 11/14/2020  Interpretation Summary   The study is normal. The study is low risk.   No ST deviation was noted.   LV perfusion is normal. There is no evidence of ischemia. There is no evidence of infarction.   Left ventricular function is normal. Nuclear stress EF: 67 %. The left ventricular ejection fraction is hyperdynamic (>65%). End diastolic cavity size is normal. Paradoxical septal motion secondary to post-op status.   Prior study available for comparison from 06/28/2015. No changes on perfusion images compared to prior study.            ______________________________________________________________________________________________      Risk Assessment/Calculations           Physical Exam VS:  BP 130/70   Pulse 74   Ht 5' 10 (1.778 m)   Wt 176 lb 4.8 oz (80 kg)   SpO2 97%   BMI 25.30 kg/m        Wt Readings from Last 3 Encounters:  11/15/23 176 lb 4.8 oz (80 kg)  11/11/23 176 lb (79.8 kg)  11/04/23 175 lb (79.4 kg)    GEN: Well nourished, well developed in no acute distress NECK: No JVD; bilateral carotid bruit, R>L CARDIAC: RRR, no murmurs, rubs, gallops RESPIRATORY:  Clear to auscultation without rales, wheezing or rhonchi  ABDOMEN: Soft, non-tender, non-distended EXTREMITIES:  No edema; No deformity   ASSESSMENT AND PLAN  Preop - According to the Revised Cardiac Risk Index (RCRI), his Perioperative Risk of Major Cardiac Event is (%): 11. His Functional Capacity in METs is: 7.34 according to the [facility] Activity  Status Index (DASI). Per AHA/ACC guidelines, he is deemed acceptable risk for the planned procedure without additional cardiovascular testing. Will route to surgical team so they are aware.   CAD s/p CABG - stable and asymptomatic; EKG today: NSR @69 , incomplete RBBB, stable lateral TWI; similar to previous; no  indication for ischemia evaluation  Continue GDMT: aspirin  81mg , metoprolol  tartrate 25mg , rosuvastatin  10mg  Encourage regular physical activity, aiming for 150 minutes weekly of moderate intensity exercise Encourage heart healthy, low sodium diet  HTN, goal <130/80 - stable and controlled on current regimen of losartan  50mg  daily, metoprolol  tartrate 25mg  - 1 tab AM, 0.5 tab HS; labs 10/2023: creatinine 1.22  HLD, LDL goal <70 - recent labs with LDL 89 on rosuvastatin  10mg  daily; noted intolerances to lipitor and zetia  previously; Lpa not previously assessed. Has not previously tried higher doses Rosuvastatin .  Plans for repeat fasting labs with PCP early December  Advised to discuss Lpa testing with upcoming labs Literature provided on Nexletol, PCSK9i, and Inclisarin. Encouraged to discuss with PCP and Dr. Anner at upcoming visits if LDL remains above goal <70  Bilateral carotid artery stenosis - asymptomatic 80-99% bilateral stenosis by duplex and CT imaging; follows with [facility] with planned surgery, as above    Dispo: as scheduled with Dr. Anner 12/20/23  Signed, Reche GORMAN Finder, NP

## 2023-11-18 NOTE — Progress Notes (Signed)
 Anesthesia Chart Review:  Case: [id number] Date/Time: 11/20/23 1045   Procedure: ENDARTERECTOMY, CAROTID (Left)   Anesthesia type: General   Diagnosis: Carotid stenosis, bilateral [I65.23]   Pre-op diagnosis: bilateral carotid stenosis   Location: [facility] ROOM 16 / [facility]   Surgeons: Serene Gaile ORN, MD       DISCUSSION: Patient is a 78 year old male scheduled for the above procedure. He has asymptomatic 80-99% BICA stenosis by 10/24/2023 US .  History includes never smoker (former smokeless tobacco, quit 2007), HTN, CAD (s/p CABG: LIMA-LAD, SVG-DIAG, SVG-OM1, SVG-rPRDA 08/29/2010), HLD, carotid artery stenosis, prostate cancer (s/p robotic assisted laparoscopic radical prostatectomy, pelvic lymphadenopathy 10/13/2018), GERD, cholecystectomy (07/29/2015), skin cancer (SCC, s/h Mohs).   He had cardiology visit with preoperative evaluation on 11/72025 by Emiliano Mora, NP. Felt stable from CAD standpoint. EKG showed NSR, RBBB, stable lateral TWI, similar to prior. No ischemic testing recommended. Normal stress test in 2022.  In regards to surgery, According to the Revised Cardiac Risk Index (RCRI), his Perioperative Risk of Major Cardiac Event is (%): 11. His Functional Capacity in METs is: 7.34 according to the Duke Activity Status Index (DASI). Per AHA/ACC guidelines, he is deemed acceptable risk for the planned procedure without additional cardiovascular testing. Will route to surgical team so they are aware. Next visit with Dr. Anner scheduled for 12/20/2023.   Anesthesia team to evaluate on the day of surgery.    VS: BP (!) 161/72   Pulse 75   Temp 36.6 C   Resp 18   Ht 5' 10 (1.778 m)   Wt 80.2 kg   SpO2 99%   BMI 25.37 kg/m   PROVIDERS: Clarice Nottingham, MD is PCP  Anner Lenis, MD is cardiologist  Serene Gaile Dixons, MD is vascular surgeon  LABS: Labs reviewed: Acceptable for surgery. (all labs ordered are listed, but only abnormal results are displayed)  Labs Reviewed   COMPREHENSIVE METABOLIC PANEL WITH GFR - Abnormal; Notable for the following components:      Result Value   Chloride 97 (*)    Glucose, Bld 106 (*)    Creatinine, Ser 1.35 (*)    Total Protein 6.3 (*)    GFR, Estimated 54 (*)    All other components within normal limits  URINALYSIS, ROUTINE W REFLEX MICROSCOPIC - Abnormal; Notable for the following components:   Hgb urine dipstick SMALL (*)    All other components within normal limits  SURGICAL PCR SCREEN  CBC  PROTIME-INR  APTT  TYPE AND SCREEN    IMAGES: CTA Neck 11/04/2023: IMPRESSION: 1. Positive High-grade ICA origin/bulb stenoses in the neck with BILATERAL Radiographic-STRING-Signs. Both ICAs patent but with asymmetric diminished LICA distal caliber and enhancement. And evidence of left > right ICA siphon atherosclerosis also. 2. Advanced atherosclerosis also of both Vertebral Arteries which remain patent. Severe stenoses of the dominant left vertebral artery origin, and the nondominant distal right vertebral V4. Moderate additional distal left artery V4 segment.    EKG: 11/15/2023:  Normal sinus rhythm Incomplete right bundle branch block Stable lateral TWI Confirmed by Vannie Mora ([id number]) on 11/15/2023 9:20:13 AM   CV: US  Carotid 10/24/2023: Summary:  - Right Carotid: Velocities in the right ICA are consistent with a 80-99% stenosis.  - Left Carotid: Velocities in the left ICA are consistent with a 80-99% stenosis.  - Vertebrals: Bilateral vertebral arteries demonstrate antegrade flow.  - Subclavians: Normal flow hemodynamics were seen in bilateral subclavian arteries.   Nuclear stress test 11/14/2020:   The study  is normal. The study is low risk.   No ST deviation was noted.   LV perfusion is normal. There is no evidence of ischemia. There is no evidence of infarction.   Left ventricular function is normal. Nuclear stress EF: 67 %. The left ventricular ejection fraction is hyperdynamic (>65%). End  diastolic cavity size is normal. Paradoxical septal motion secondary to post-op status.   Prior study available for comparison from 06/28/2015. No changes on perfusion images compared to prior study.   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   Cancer (HCC)    skin cancers-squamous cell / facial    COVID    x2   Dyslipidemia - low HDL    Lipids monitored by Dr. Clarice.   GERD (gastroesophageal reflux disease)    History of multiple allergies    History of vertigo    Hypertension    Labile   Low testosterone     Recently stopped replacement therapy.   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   Urinary incontinence, urge    Wears glasses     Past Surgical History:  Procedure Laterality Date   arthroscopy of knees bilat      CARDIAC CATHETERIZATION  08/18/2010   Terminal left main 70%, ostial LAD 90%, sequential 60-70% lesion in the proximal LAD, mild circumflex lesions, but large OM1 and OM2. RCA had moderate irregularities and a subtotal proximal PDA   CARDIOVASCULAR STRESS TEST  02/06/2011   EKG negative for ischemia. No significant wall motion abnormalities noted.   CATARACT EXTRACTION, BILATERAL     CHOLECYSTECTOMY N/A 07/29/2015   Procedure: LAPAROSCOPIC CHOLECYSTECTOMY WITH INTRAOPERATIVE CHOLANGIOGRAM;  Surgeon: Krystal Russell, MD;  Location: [facility]S;  Service: General;  Laterality:  N/A;   COLONOSCOPY     CORONARY ARTERY BYPASS GRAFT  08/29/2010   x4. LIMA to LAD, SVG to diag, SVG to OM, SVG to PDA. Rt thigh vein harvest   HAND SURGERY Right 10/2023   carpal tunnel and surgery for finger numbness   LYMPHADENECTOMY Bilateral 10/13/2018   Procedure: LYMPHADENECTOMY, PELVIC;  Surgeon: Renda Glance, MD;  Location: [facility];  Service: Urology;  Laterality: Bilateral;   MOHS SURGERY     Face and Neck Left eyelid   NM MYOVIEW  LTD  11/2020   No Ischemia or Infarction. EF 65-70%. LOW RISK. NORMAL   PRE CABG DOPPLER  08/25/2010   No significant extracranial carotid artery stenosis. ECA stenosis noted bilaterally - ICA/CCA ratio id 0.9-rt and 0.74-lft   ROBOT ASSISTED LAPAROSCOPIC RADICAL PROSTATECTOMY N/A 10/13/2018   Procedure: XI ROBOTIC ASSISTED LAPAROSCOPIC RADICAL PROSTATECTOMY LEVEL 2;  Surgeon: Renda Glance, MD;  Location: [facility];  Service: Urology;  Laterality: N/A;   SHOULDER ARTHROSCOPY Left 08/09/2023   rotator cuff repair- D Beane   SURGERY  SCROTAL / TESTICULAR     Benign nodule   TONSILLECTOMY     TRANSTHORACIC ECHOCARDIOGRAM  07/27/2010   EF >55%, mild concentric LVH, stage 1 diastolic dysfunction    MEDICATIONS:  ACETAMINOPHEN  PO   aspirin  EC 81 MG tablet   Calcium  Carb-Cholecalciferol (CALCIUM -VITAMIN D PO)   calcium  carbonate (OSCAL) 1500 (600 Ca) MG TABS tablet   Coenzyme Q10 100 MG capsule   diazepam (VALIUM) 2 MG tablet   diphenhydrAMINE  (BENADRYL ) 25 MG tablet   docusate sodium  (COLACE) 100 MG capsule   doxycycline (VIBRAMYCIN) 100 MG capsule   famotidine (PEPCID) 20 MG tablet   ipratropium (ATROVENT) 0.06 % nasal spray   losartan  (COZAAR ) 50 MG tablet   metoprolol  tartrate (LOPRESSOR ) 25 MG tablet   Nutritional Supplements (COLD AND FLU PO)   pantoprazole  (PROTONIX ) 40 MG tablet   Pediatric Multiple Vitamins (MULTIVITAMIN CHILDRENS) CHEW   Polyethyl Glycol-Propyl Glycol (LUBRICATING EYE DROPS OP)   pseudoephedrine (SUDAFED) 30 MG tablet    rosuvastatin  (CRESTOR ) 10 MG tablet   sildenafil (REVATIO) 20 MG tablet   traMADol  (ULTRAM ) 50 MG tablet   No current facility-administered medications for this encounter.   He is not currently taking Oscal or doxycycline.  Isaiah Ruder, PA-C Surgical Short Stay/Anesthesiology [facility] Phone [phone] [facility] Phone [phone] 11/18/2023 7:47 PM

## 2023-11-18 NOTE — Anesthesia Preprocedure Evaluation (Signed)
 Anesthesia Evaluation  Patient identified by MRN, date of birth, ID band Patient awake    Reviewed: Allergy & Precautions, NPO status , Patient's Chart, lab work & pertinent test results  History of Anesthesia Complications (+) PONV and history of anesthetic complications  Airway Mallampati: I  TM Distance: >3 FB Neck ROM: Full    Dental  (+) Edentulous Upper, Edentulous Lower, Dental Advisory Given   Pulmonary asthma    Pulmonary exam normal breath sounds clear to auscultation       Cardiovascular hypertension, Pt. on medications Normal cardiovascular exam+ pacemaker  Rhythm:Regular Rate:Normal  Stress Test 2025 Stress EKG is nondiagnostic given the pharmacological stress, did not  achieve 85% of APMHR.    LV perfusion is normal. There is no evidence of ischemia. There is no  evidence of infarction.    Left ventricular function is normal. Nuclear stress EF: 65%. The left  ventricular ejection fraction is normal (55-65%). End diastolic cavity  size is normal.    Coronary calcium was absent on the attenuation correction CT images.   Accuracy be limited due to the presence of cardiac device leads.    Prior study not available for comparison. ...   TTE 2025  1. Left ventricular ejection fraction, by estimation, is 55 to 60%. Left  ventricular ejection fraction by 3D volume is 50 %. The left ventricle has  normal function. The left ventricle has no regional wall motion  abnormalities. There is mild left  ventricular hypertrophy. Left ventricular diastolic parameters were  normal. The average left ventricular global longitudinal strain is -21.2  %. The global longitudinal strain is normal.   2. Right ventricular systolic function is normal. The right ventricular  size is normal. There is normal pulmonary artery systolic pressure. The  estimated right ventricular systolic pressure is 27.6 mmHg.   3. The mitral valve is  normal in structure. Mild mitral valve  regurgitation. No evidence of mitral stenosis.   4. The aortic valve is tricuspid. Aortic valve regurgitation is not  visualized. No aortic stenosis is present.   5. The inferior vena cava is normal in size with greater than 50%  respiratory variability, suggesting right atrial pressure of 3 mmHg.      Neuro/Psych  PSYCHIATRIC DISORDERS Anxiety     negative neurological ROS     GI/Hepatic Neg liver ROS, hiatal hernia,,,  Endo/Other  Hypothyroidism    Renal/GU Renal InsufficiencyRenal disease  negative genitourinary   Musculoskeletal  (+)  Fibromyalgia -  Abdominal   Peds  Hematology negative hematology ROS (+)   Anesthesia Other Findings 78 y.o. male w/PMHx of  Anxiety, fibromyalgia, HTN, hypothyroidism Mobitz II AVBblock w/PPM   Reproductive/Obstetrics                              Anesthesia Physical Anesthesia Plan  ASA: 3  Anesthesia Plan: General   Post-op Pain Management: Tylenol  PO (pre-op )*   Induction: Intravenous  PONV Risk Score and Plan: 4 or greater and Ondansetron , Dexamethasone , Treatment may vary due to age or medical condition and TIVA  Airway Management Planned: LMA  Additional Equipment:   Intra-op Plan:   Post-operative Plan: Extubation in OR  Informed Consent: I have reviewed the patients History and Physical, chart, labs and discussed the procedure including the risks, benefits and alternatives for the proposed anesthesia with the patient or authorized representative who has indicated his/her understanding and acceptance.     Dental

## 2023-11-20 ENCOUNTER — Other Ambulatory Visit: Payer: Self-pay

## 2023-11-20 ENCOUNTER — Inpatient Hospital Stay (HOSPITAL_COMMUNITY): Payer: Self-pay | Admitting: Anesthesiology

## 2023-11-20 ENCOUNTER — Encounter (HOSPITAL_COMMUNITY)
Admission: RE | Disposition: A | Discharge status: Home / Self Care | Payer: Self-pay | Source: Home / Self Care | Attending: Surgery

## 2023-11-20 ENCOUNTER — Inpatient Hospital Stay (HOSPITAL_COMMUNITY)
Admission: RE | Admit: 2023-11-20 | Discharge: 2023-11-22 | DRG: 039 | Disposition: A | Discharge status: Home / Self Care | Attending: Surgery | Admitting: Surgery

## 2023-11-20 ENCOUNTER — Encounter (HOSPITAL_COMMUNITY): Payer: Self-pay | Admitting: Surgery

## 2023-11-20 DIAGNOSIS — R531 Weakness: Secondary | ICD-10-CM | POA: Diagnosis present

## 2023-11-20 DIAGNOSIS — I6523 Occlusion and stenosis of bilateral carotid arteries: Secondary | ICD-10-CM

## 2023-11-20 DIAGNOSIS — I10 Essential (primary) hypertension: Secondary | ICD-10-CM | POA: Diagnosis present

## 2023-11-20 DIAGNOSIS — Z87891 Personal history of nicotine dependence: Secondary | ICD-10-CM | POA: Diagnosis not present

## 2023-11-20 DIAGNOSIS — I959 Hypotension, unspecified: Secondary | ICD-10-CM | POA: Diagnosis present

## 2023-11-20 DIAGNOSIS — I2510 Atherosclerotic heart disease of native coronary artery without angina pectoris: Secondary | ICD-10-CM | POA: Diagnosis present

## 2023-11-20 DIAGNOSIS — Z8042 Family history of malignant neoplasm of prostate: Secondary | ICD-10-CM

## 2023-11-20 DIAGNOSIS — K219 Gastro-esophageal reflux disease without esophagitis: Secondary | ICD-10-CM | POA: Diagnosis present

## 2023-11-20 DIAGNOSIS — Z8249 Family history of ischemic heart disease and other diseases of the circulatory system: Secondary | ICD-10-CM

## 2023-11-20 DIAGNOSIS — R000 Tachycardia, unspecified: Secondary | ICD-10-CM | POA: Diagnosis present

## 2023-11-20 DIAGNOSIS — Z951 Presence of aortocoronary bypass graft: Secondary | ICD-10-CM | POA: Diagnosis not present

## 2023-11-20 DIAGNOSIS — Z8546 Personal history of malignant neoplasm of prostate: Secondary | ICD-10-CM | POA: Diagnosis not present

## 2023-11-20 DIAGNOSIS — I6522 Occlusion and stenosis of left carotid artery: Secondary | ICD-10-CM

## 2023-11-20 DIAGNOSIS — Z800 Family history of malignant neoplasm of digestive organs: Secondary | ICD-10-CM | POA: Diagnosis not present

## 2023-11-20 DIAGNOSIS — Z79899 Other long term (current) drug therapy: Secondary | ICD-10-CM | POA: Diagnosis not present

## 2023-11-20 DIAGNOSIS — Z9889 Other specified postprocedural states: Principal | ICD-10-CM

## 2023-11-20 DIAGNOSIS — E7800 Pure hypercholesterolemia, unspecified: Secondary | ICD-10-CM | POA: Diagnosis present

## 2023-11-20 DIAGNOSIS — I493 Ventricular premature depolarization: Secondary | ICD-10-CM | POA: Diagnosis present

## 2023-11-20 DIAGNOSIS — Z85828 Personal history of other malignant neoplasm of skin: Secondary | ICD-10-CM

## 2023-11-20 DIAGNOSIS — Z8051 Family history of malignant neoplasm of kidney: Secondary | ICD-10-CM

## 2023-11-20 HISTORY — PX: ENDARTERECTOMY: SHX5162

## 2023-11-20 LAB — CBC
HCT: 46.6 % (ref 39.0–52.0)
Hemoglobin: 15.9 g/dL (ref 13.0–17.0)
MCH: 29.7 pg (ref 26.0–34.0)
MCHC: 34.1 g/dL (ref 30.0–36.0)
MCV: 86.9 fL (ref 80.0–100.0)
Platelets: 208 K/uL (ref 150–400)
RBC: 5.36 MIL/uL (ref 4.22–5.81)
RDW: 13 % (ref 11.5–15.5)
WBC: 9.4 K/uL (ref 4.0–10.5)
nRBC: 0 % (ref 0.0–0.2)

## 2023-11-20 LAB — POCT ACTIVATED CLOTTING TIME
Activated Clotting Time: 273 s
Activated Clotting Time: 233 s

## 2023-11-20 LAB — CREATININE, SERUM
Creatinine, Ser: 1.28 mg/dL — ABNORMAL HIGH (ref 0.61–1.24)
GFR, Estimated: 57 mL/min — ABNORMAL LOW (ref >60)

## 2023-11-20 SURGERY — ENDARTERECTOMY, CAROTID
Anesthesia: General | Site: Neck | Laterality: Left

## 2023-11-20 MED ORDER — HEPARIN 6000 UNIT IRRIGATION SOLUTION
Status: DC | PRN
  Administered 2023-11-20: 1

## 2023-11-20 MED ORDER — PHENYLEPHRINE HCL-NACL 20-0.9 MG/250ML-% IV SOLN
INTRAVENOUS | Status: DC | PRN
Start: 2023-11-20 — End: 2023-11-20
  Administered 2023-11-20: 35 ug/min via INTRAVENOUS

## 2023-11-20 MED ORDER — PHENYLEPHRINE 80 MCG/ML (10ML) SYRINGE FOR IV PUSH (FOR BLOOD PRESSURE SUPPORT)
PREFILLED_SYRINGE | INTRAVENOUS | Status: AC
  Filled 2023-11-20: qty 10

## 2023-11-20 MED ORDER — LIDOCAINE HCL (PF) 1 % IJ SOLN
INTRAMUSCULAR | Status: AC
Start: 2023-11-20 — End: 2023-11-20
  Filled 2023-11-20: qty 5

## 2023-11-20 MED ORDER — PHENOL 1.4 % MT LIQD: 1.0000 | OROMUCOSAL | Status: DC | PRN

## 2023-11-20 MED ORDER — HEPARIN SODIUM (PORCINE) 1000 UNIT/ML IJ SOLN
INTRAMUSCULAR | Status: DC | PRN
  Administered 2023-11-20: 8000 [IU] via INTRAVENOUS

## 2023-11-20 MED ORDER — LACTATED RINGERS IV SOLN: INTRAVENOUS | Status: DC | PRN

## 2023-11-20 MED ORDER — CLEVIDIPINE BUTYRATE 0.5 MG/ML IV EMUL
INTRAVENOUS | Status: AC
  Filled 2023-11-20: qty 50

## 2023-11-20 MED ORDER — SODIUM CHLORIDE 0.9% FLUSH
3.0000 mL | Freq: Two times a day (BID) | INTRAVENOUS | Status: DC
  Administered 2023-11-20 – 2023-11-22 (×3): 3 mL via INTRAVENOUS

## 2023-11-20 MED ORDER — DEXAMETHASONE SOD PHOSPHATE PF 10 MG/ML IJ SOLN
INTRAMUSCULAR | Status: DC | PRN
  Administered 2023-11-20: 10 mg via INTRAVENOUS

## 2023-11-20 MED ORDER — PROPOFOL 10 MG/ML IV BOLUS
INTRAVENOUS | Status: AC
Start: 2023-11-20 — End: 2023-11-20
  Filled 2023-11-20: qty 20

## 2023-11-20 MED ORDER — LABETALOL HCL 5 MG/ML IV SOLN
INTRAVENOUS | Status: AC
  Filled 2023-11-20: qty 4

## 2023-11-20 MED ORDER — CHLORHEXIDINE GLUCONATE CLOTH 2 % EX PADS: 6.0000 | MEDICATED_PAD | Freq: Once | CUTANEOUS | Status: DC

## 2023-11-20 MED ORDER — MORPHINE SULFATE (PF) 2 MG/ML IV SOLN
2.0000 mg | INTRAVENOUS | Status: DC | PRN
  Administered 2023-11-20: 2 mg via INTRAVENOUS
  Filled 2023-11-20: qty 1

## 2023-11-20 MED ORDER — HYDRALAZINE HCL 20 MG/ML IJ SOLN
5.0000 mg | INTRAMUSCULAR | Status: AC | PRN
  Administered 2023-11-20 – 2023-11-21 (×2): 5 mg via INTRAVENOUS
  Filled 2023-11-20 (×2): qty 1

## 2023-11-20 MED ORDER — SODIUM CHLORIDE 0.9% FLUSH: 3.0000 mL | INTRAVENOUS | Status: DC | PRN

## 2023-11-20 MED ORDER — SUGAMMADEX SODIUM 200 MG/2ML IV SOLN
INTRAVENOUS | Status: DC | PRN
  Administered 2023-11-20: 200 mg via INTRAVENOUS

## 2023-11-20 MED ORDER — 0.9 % SODIUM CHLORIDE (POUR BTL) OPTIME
TOPICAL | Status: DC | PRN
  Administered 2023-11-20: 2000 mL

## 2023-11-20 MED ORDER — PROPOFOL 10 MG/ML IV BOLUS
INTRAVENOUS | Status: DC | PRN
Start: 2023-11-20 — End: 2023-11-20
  Administered 2023-11-20: 100 mg via INTRAVENOUS
  Administered 2023-11-20 (×2): 20 mg via INTRAVENOUS
  Administered 2023-11-20: 30 mg via INTRAVENOUS
  Administered 2023-11-20: 50 mg via INTRAVENOUS

## 2023-11-20 MED ORDER — AMISULPRIDE (ANTIEMETIC) 5 MG/2ML IV SOLN: 10.0000 mg | Freq: Once | INTRAVENOUS | Status: DC | PRN

## 2023-11-20 MED ORDER — EPHEDRINE SULFATE-NACL 50-0.9 MG/10ML-% IV SOSY
PREFILLED_SYRINGE | INTRAVENOUS | Status: DC | PRN
Start: 2023-11-20 — End: 2023-11-20
  Administered 2023-11-20: 2.5 mg via INTRAVENOUS

## 2023-11-20 MED ORDER — ONDANSETRON HCL 4 MG/2ML IJ SOLN
INTRAMUSCULAR | Status: AC
Start: 2023-11-20 — End: 2023-11-20
  Filled 2023-11-20: qty 2

## 2023-11-20 MED ORDER — METOPROLOL TARTRATE 5 MG/5ML IV SOLN: 2.5000 mg | INTRAVENOUS | Status: DC | PRN

## 2023-11-20 MED ORDER — DIAZEPAM 2 MG PO TABS
1.0000 mg | ORAL_TABLET | Freq: Every day | ORAL | Status: DC | PRN
  Administered 2023-11-21: 1 mg via ORAL
  Filled 2023-11-20: qty 1

## 2023-11-20 MED ORDER — FENTANYL CITRATE (PF) 100 MCG/2ML IJ SOLN
25.0000 ug | INTRAMUSCULAR | Status: DC | PRN
  Administered 2023-11-20: 25 ug via INTRAVENOUS

## 2023-11-20 MED ORDER — BISACODYL 5 MG PO TBEC
5.0000 mg | DELAYED_RELEASE_TABLET | Freq: Every day | ORAL | Status: DC | PRN
Start: 2023-11-20 — End: 2023-11-22

## 2023-11-20 MED ORDER — ACETAMINOPHEN 650 MG RE SUPP: 325.0000 mg | RECTAL | Status: DC | PRN

## 2023-11-20 MED ORDER — TRAMADOL HCL 50 MG PO TABS
50.0000 mg | ORAL_TABLET | Freq: Four times a day (QID) | ORAL | Status: DC | PRN
  Filled 2023-11-20: qty 1

## 2023-11-20 MED ORDER — PHENYLEPHRINE 80 MCG/ML (10ML) SYRINGE FOR IV PUSH (FOR BLOOD PRESSURE SUPPORT)
PREFILLED_SYRINGE | INTRAVENOUS | Status: DC | PRN
  Administered 2023-11-20: 40 ug via INTRAVENOUS

## 2023-11-20 MED ORDER — OXYCODONE HCL 5 MG/5ML PO SOLN: 5.0000 mg | Freq: Once | ORAL | Status: DC | PRN

## 2023-11-20 MED ORDER — FENTANYL CITRATE (PF) 250 MCG/5ML IJ SOLN
INTRAMUSCULAR | Status: AC
  Filled 2023-11-20: qty 5

## 2023-11-20 MED ORDER — FLEET ENEMA RE ENEM: 1.0000 | ENEMA | Freq: Once | RECTAL | Status: DC | PRN

## 2023-11-20 MED ORDER — ROSUVASTATIN CALCIUM 5 MG PO TABS
10.0000 mg | ORAL_TABLET | Freq: Every day | ORAL | Status: DC
  Administered 2023-11-20 – 2023-11-21 (×2): 10 mg via ORAL
  Filled 2023-11-20 (×2): qty 2

## 2023-11-20 MED ORDER — LIDOCAINE HCL (PF) 1 % IJ SOLN
INTRAMUSCULAR | Status: AC
  Filled 2023-11-20: qty 30

## 2023-11-20 MED ORDER — ONDANSETRON HCL 4 MG/2ML IJ SOLN: 4.0000 mg | Freq: Four times a day (QID) | INTRAMUSCULAR | Status: DC | PRN

## 2023-11-20 MED ORDER — SENNOSIDES-DOCUSATE SODIUM 8.6-50 MG PO TABS: 1.0000 | ORAL_TABLET | Freq: Every evening | ORAL | Status: DC | PRN

## 2023-11-20 MED ORDER — PROTAMINE SULFATE 10 MG/ML IV SOLN
INTRAVENOUS | Status: DC | PRN
  Administered 2023-11-20: 50 mg via INTRAVENOUS

## 2023-11-20 MED ORDER — SODIUM CHLORIDE 0.9 % IV SOLN: 250.0000 mL | INTRAVENOUS | Status: AC | PRN

## 2023-11-20 MED ORDER — HEMOSTATIC AGENTS (NO CHARGE) OPTIME
TOPICAL | Status: DC | PRN
  Administered 2023-11-20: 1 via TOPICAL

## 2023-11-20 MED ORDER — FENTANYL CITRATE (PF) 250 MCG/5ML IJ SOLN
INTRAMUSCULAR | Status: DC | PRN
Start: 2023-11-20 — End: 2023-11-20
  Administered 2023-11-20 (×3): 50 ug via INTRAVENOUS

## 2023-11-20 MED ORDER — LIDOCAINE 2% (20 MG/ML) 5 ML SYRINGE
INTRAMUSCULAR | Status: DC | PRN
  Administered 2023-11-20: 100 mg via INTRAVENOUS

## 2023-11-20 MED ORDER — POTASSIUM CHLORIDE CRYS ER 20 MEQ PO TBCR: 40.0000 meq | EXTENDED_RELEASE_TABLET | Freq: Every day | ORAL | Status: DC | PRN

## 2023-11-20 MED ORDER — LABETALOL HCL 5 MG/ML IV SOLN
INTRAVENOUS | Status: AC
Start: 2023-11-20 — End: 2023-11-20
  Filled 2023-11-20: qty 4

## 2023-11-20 MED ORDER — LOSARTAN POTASSIUM 50 MG PO TABS
50.0000 mg | ORAL_TABLET | Freq: Every day | ORAL | Status: DC
  Administered 2023-11-20 – 2023-11-21 (×2): 50 mg via ORAL
  Filled 2023-11-20 (×3): qty 1

## 2023-11-20 MED ORDER — PANTOPRAZOLE SODIUM 40 MG PO TBEC
40.0000 mg | DELAYED_RELEASE_TABLET | ORAL | Status: DC
  Administered 2023-11-22: 40 mg via ORAL
  Filled 2023-11-20: qty 1

## 2023-11-20 MED ORDER — ONDANSETRON HCL 4 MG/2ML IJ SOLN: 4.0000 mg | Freq: Once | INTRAMUSCULAR | Status: DC | PRN

## 2023-11-20 MED ORDER — CLEVIDIPINE BUTYRATE 0.5 MG/ML IV EMUL
INTRAVENOUS | Status: DC | PRN
Start: 2023-11-20 — End: 2023-11-20
  Administered 2023-11-20: 1 mg/h via INTRAVENOUS

## 2023-11-20 MED ORDER — ACETAMINOPHEN 325 MG PO TABS
325.0000 mg | ORAL_TABLET | ORAL | Status: DC | PRN
  Administered 2023-11-20 – 2023-11-21 (×3): 650 mg via ORAL
  Filled 2023-11-20 (×3): qty 2

## 2023-11-20 MED ORDER — LABETALOL HCL 5 MG/ML IV SOLN
10.0000 mg | INTRAVENOUS | Status: AC | PRN
  Administered 2023-11-20 – 2023-11-21 (×4): 10 mg via INTRAVENOUS
  Filled 2023-11-20 (×2): qty 4

## 2023-11-20 MED ORDER — FENTANYL CITRATE (PF) 100 MCG/2ML IJ SOLN
INTRAMUSCULAR | Status: AC
  Filled 2023-11-20: qty 2

## 2023-11-20 MED ORDER — VANCOMYCIN HCL IN DEXTROSE 1-5 GM/200ML-% IV SOLN
1000.0000 mg | Freq: Two times a day (BID) | INTRAVENOUS | Status: AC
  Administered 2023-11-20 – 2023-11-21 (×2): 1000 mg via INTRAVENOUS
  Filled 2023-11-20 (×2): qty 200

## 2023-11-20 MED ORDER — DOCUSATE SODIUM 100 MG PO CAPS
100.0000 mg | ORAL_CAPSULE | Freq: Every day | ORAL | Status: DC
  Administered 2023-11-21: 100 mg via ORAL
  Filled 2023-11-20 (×2): qty 1

## 2023-11-20 MED ORDER — LABETALOL HCL 5 MG/ML IV SOLN
5.0000 mg | Freq: Once | INTRAVENOUS | Status: AC
  Administered 2023-11-20: 5 mg via INTRAVENOUS

## 2023-11-20 MED ORDER — FAMOTIDINE 20 MG PO TABS
20.0000 mg | ORAL_TABLET | ORAL | Status: DC
  Administered 2023-11-21: 20 mg via ORAL
  Filled 2023-11-20: qty 1

## 2023-11-20 MED ORDER — SODIUM CHLORIDE 0.9 % IV SOLN: 500.0000 mL | Freq: Once | INTRAVENOUS | Status: DC | PRN

## 2023-11-20 MED ORDER — HEPARIN SODIUM (PORCINE) 5000 UNIT/ML IJ SOLN
5000.0000 [IU] | Freq: Three times a day (TID) | INTRAMUSCULAR | Status: DC
  Administered 2023-11-21 – 2023-11-22 (×4): 5000 [IU] via SUBCUTANEOUS
  Filled 2023-11-20 (×4): qty 1

## 2023-11-20 MED ORDER — SODIUM CHLORIDE 0.9 % IV SOLN: INTRAVENOUS | Status: DC

## 2023-11-20 MED ORDER — VANCOMYCIN HCL IN DEXTROSE 1-5 GM/200ML-% IV SOLN
1000.0000 mg | INTRAVENOUS | Status: AC
  Administered 2023-11-20: 1000 mg via INTRAVENOUS
  Filled 2023-11-20: qty 200

## 2023-11-20 MED ORDER — LACTATED RINGERS IV SOLN: INTRAVENOUS | Status: DC

## 2023-11-20 MED ORDER — ACETAMINOPHEN 10 MG/ML IV SOLN: 1000.0000 mg | Freq: Once | INTRAVENOUS | Status: DC | PRN

## 2023-11-20 MED ORDER — CHLORHEXIDINE GLUCONATE 0.12 % MT SOLN
15.0000 mL | Freq: Once | OROMUCOSAL | Status: AC
  Administered 2023-11-20: 15 mL via OROMUCOSAL
  Filled 2023-11-20: qty 15

## 2023-11-20 MED ORDER — HEPARIN 6000 UNIT IRRIGATION SOLUTION
Status: AC
Start: 2023-11-20 — End: 2023-11-20
  Filled 2023-11-20: qty 500

## 2023-11-20 MED ORDER — ROCURONIUM BROMIDE 10 MG/ML (PF) SYRINGE
PREFILLED_SYRINGE | INTRAVENOUS | Status: DC | PRN
  Administered 2023-11-20: 20 mg via INTRAVENOUS
  Administered 2023-11-20: 70 mg via INTRAVENOUS

## 2023-11-20 MED ORDER — ASPIRIN 81 MG PO TBEC
81.0000 mg | DELAYED_RELEASE_TABLET | Freq: Every day | ORAL | Status: DC
  Administered 2023-11-21 – 2023-11-22 (×2): 81 mg via ORAL
  Filled 2023-11-20 (×2): qty 1

## 2023-11-20 MED ORDER — ORAL CARE MOUTH RINSE: 15.0000 mL | Freq: Once | OROMUCOSAL | Status: AC

## 2023-11-20 MED ORDER — HEPARIN 6000 UNIT IRRIGATION SOLUTION
Status: AC
  Filled 2023-11-20: qty 500

## 2023-11-20 MED ORDER — OXYCODONE HCL 5 MG PO TABS: 5.0000 mg | ORAL_TABLET | Freq: Once | ORAL | Status: DC | PRN

## 2023-11-20 SURGICAL SUPPLY — 46 items
BAG COUNTER SPONGE SURGICOUNT (BAG) ×1 IMPLANT
CANISTER SUCTION 3000ML PPV (SUCTIONS) ×1 IMPLANT
CATH ROBINSON RED A/P 18FR (CATHETERS) ×1 IMPLANT
CATH SUCT 10FR WHISTLE TIP (CATHETERS) ×1 IMPLANT
CLIP TI MEDIUM 6 (CLIP) ×1 IMPLANT
CLIP TI WIDE RED SMALL 6 (CLIP) ×1 IMPLANT
COVER PROBE W GEL 5X96 (DRAPES) IMPLANT
DERMABOND ADVANCED .7 DNX12 (GAUZE/BANDAGES/DRESSINGS) ×1 IMPLANT
ELECTRODE REM PT RTRN 9FT ADLT (ELECTROSURGICAL) ×1 IMPLANT
EVACUATOR SILICONE 100CC (DRAIN) IMPLANT
GAUZE SPONGE 4X4 12PLY STRL (GAUZE/BANDAGES/DRESSINGS) IMPLANT
GLOVE BIOGEL PI IND STRL 8 (GLOVE) ×1 IMPLANT
GLOVE SURG SS PI 7.5 STRL IVOR (GLOVE) ×1 IMPLANT
GOWN STRL REUS W/ TWL LRG LVL3 (GOWN DISPOSABLE) ×2 IMPLANT
GOWN STRL REUS W/ TWL XL LVL3 (GOWN DISPOSABLE) ×1 IMPLANT
GRAFT VASC PATCH XENOSURE 1X14 (Vascular Products) IMPLANT
HEMOSTAT SNOW SURGICEL 2X4 (HEMOSTASIS) IMPLANT
INSERT FOGARTY SM (MISCELLANEOUS) IMPLANT
KIT BASIN OR (CUSTOM PROCEDURE TRAY) ×1 IMPLANT
KIT SHUNT ARGYLE CAROTID ART 6 (VASCULAR PRODUCTS) IMPLANT
KIT SUCTION CATH 14FR (SUCTIONS) IMPLANT
KIT SUCTION CATH SOFT MINI 10F (CATHETERS) IMPLANT
KIT TURNOVER KIT B (KITS) ×1 IMPLANT
KIT WRENCH TORQUE (KITS) IMPLANT
NDL HYPO 25GX1X1/2 BEV (NEEDLE) IMPLANT
NEEDLE HYPO 25GX1X1/2 BEV (NEEDLE) IMPLANT
PACK CAROTID (CUSTOM PROCEDURE TRAY) ×1 IMPLANT
PAD ARMBOARD POSITIONER FOAM (MISCELLANEOUS) ×2 IMPLANT
POSITIONER HEAD DONUT 9IN (MISCELLANEOUS) ×1 IMPLANT
SET WALTER ACTIVATION W/DRAPE (SET/KITS/TRAYS/PACK) IMPLANT
SHUNT CAROTID BYPASS 10 (VASCULAR PRODUCTS) IMPLANT
SHUNT CAROTID BYPASS 12FRX15.5 (VASCULAR PRODUCTS) IMPLANT
SOLN 0.9% NACL POUR BTL 1000ML (IV SOLUTION) ×3 IMPLANT
SOLN STERILE WATER BTL 1000 ML (IV SOLUTION) ×1 IMPLANT
SURGIFLO W/THROMBIN 8M KIT (HEMOSTASIS) IMPLANT
SUT ETHILON 3 0 PS 1 (SUTURE) IMPLANT
SUT PROLENE 5 0 C 1 36 (SUTURE) IMPLANT
SUT PROLENE 6 0 BV (SUTURE) ×2 IMPLANT
SUT PROLENE 7 0 BV1 MDA (SUTURE) IMPLANT
SUT SILK 3-0 18XBRD TIE 12 (SUTURE) IMPLANT
SUT VIC AB 3-0 SH 27X BRD (SUTURE) ×2 IMPLANT
SUT VIC AB 3-0 X1 27 (SUTURE) ×1 IMPLANT
SUT VIC AB 4-0 PS2 18 (SUTURE) IMPLANT
SYR CONTROL 10ML LL (SYRINGE) IMPLANT
TAPE UMBILICAL 1/8X30 (MISCELLANEOUS) IMPLANT
TOWEL GREEN STERILE (TOWEL DISPOSABLE) ×1 IMPLANT

## 2023-11-20 NOTE — Interval H&P Note (Signed)
 History and Physical Interval Note:  11/20/2023 12:21 PM  [name]  has presented today for surgery, with the diagnosis of bilateral carotid stenosis.  The various methods of treatment have been discussed with the patient and family. After consideration of risks, benefits and other options for treatment, the patient has consented to  Procedure(s): ENDARTERECTOMY, CAROTID (Left) as a surgical intervention.  The patient's history has been reviewed, patient examined, no change in status, stable for surgery.  I have reviewed the patient's chart and labs.  Questions were answered to the patient's satisfaction.     Malvina New

## 2023-11-20 NOTE — Plan of Care (Signed)
  Problem: Education: Goal: Knowledge of General Education information will improve Description: Including pain rating scale, medication(s)/side effects and non-pharmacologic comfort measures Outcome: Progressing   Problem: Education: Goal: Knowledge of discharge needs will improve Outcome: Progressing   Problem: Clinical Measurements: Goal: Postoperative complications will be avoided or minimized Outcome: Progressing   Problem: Respiratory: Goal: Will achieve and/or maintain a regular respiratory rate, without signs or symptoms of dyspnea Outcome: Progressing   Problem: Skin Integrity: Goal: Demonstration of wound healing without infection will improve Outcome: Progressing

## 2023-11-20 NOTE — Anesthesia Procedure Notes (Signed)
 Arterial Line Insertion Start/End11/12/2023 9:22 AM, 11/20/2023 9:27 AM Performed by: Harrold Macintosh, CRNA, CRNA  Patient location: Pre-op. Preanesthetic checklist: patient identified, IV checked, site marked, risks and benefits discussed, surgical consent, monitors and equipment checked, pre-op evaluation, timeout performed and anesthesia consent Lidocaine  1% used for infiltration Left, radial was placed Catheter size: 20 G Hand hygiene performed  and maximum sterile barriers used  Allen's test indicative of satisfactory collateral circulation Attempts: 1 Procedure performed using ultrasound to evaluate access site. Ultrasound Notes:relevant anatomy identified, ultrasound used to visualize needle entry, vessel patent under ultrasound and image(s) printed for medical record. Following insertion, Biopatch and dressing applied. Post procedure assessment: normal and unchanged  Patient tolerated the procedure well with no immediate complications.

## 2023-11-20 NOTE — Transfer of Care (Signed)
 Immediate Anesthesia Transfer of Care Note  Patient: [name]  Procedure(s) Performed: ENDARTERECTOMY, CAROTID with BIOPATCH (Left: Neck)  Patient Location: PACU  Anesthesia Type:General  Level of Consciousness: awake and alert   Airway & Oxygen Therapy: Patient Spontanous Breathing and Patient connected to nasal cannula oxygen  Post-op Assessment: Report given to RN and Post -op Vital signs reviewed and stable  Post vital signs: Reviewed and stable  Last Vitals:  Vitals Value Taken Time  BP 135/64 11/20/23 15:30  Temp    Pulse 81 11/20/23 15:32  Resp 19 11/20/23 15:32  SpO2 93 % 11/20/23 15:32  Vitals shown include unfiled device data.  Last Pain:  Vitals:   11/20/23 0912  TempSrc:   PainSc: 0-No pain         Complications: No notable events documented.

## 2023-11-20 NOTE — Op Note (Signed)
 Patient name: [name] MRN: [id number] DOB: 24-Mar-1945 Sex: male  11/20/2023 Pre-operative Diagnosis: Asymptomatic   left carotid stenosis Post-operative diagnosis:  Same Surgeon:  Malvina New Assistants:  JAYSON Damme, PA Procedure:    left carotid Endarterectomy with bovine pericardial patch angioplasty Anesthesia:  General Blood Loss: 100 cc Specimens: None  Findings: 90 %stenosis; Thrombus: None  Indications: This is a 78 year old gentleman who was found to have bilateral high-grade carotid stenoses.  He is asymptomatic.  We discussed the treatment options and he preferred proceeding with carotid endarterectomy.  We discussed proceeding with the left side as this was the more severe stenosis.  Procedure:  The patient was identified in the holding area and taken to [facility] OR ROOM 16  The patient was then placed supine on the table.   General endotrachial anesthesia was administered.  The patient was prepped and draped in the usual sterile fashion.  A time out was called and antibiotics were administered.  A PA was necessary to expedite the procedure and assist with technical details.  She helped with exposure by providing suction and retraction.  She helped with the anastomosis by following the suture.  The incision was made along the anterior border of the left sternocleidomastoid muscle.  Cautery was used to dissect through the subcutaneous tissue.  The platysma muscle was divided with cautery.  The internal jugular vein was exposed along its anterior medial border.  The common facial vein was exposed and then divided between 2-0 silk ties and metal clips.  The common carotid artery was then circumferentially exposed and encircled with an umbilical tape.  The vagus nerve was identified and protected.  Next sharp dissection was used to expose the external carotid artery and the superior thyroid artery.  The were encircled with a blue vessel loop and a 2-0 silk tie respectively.  Finally, the  internal carotid was carefully dissected free.  An umbilical tape was placed around the internal carotid artery distal to the diseased segment.  The hypoglossal nerve was visualized throughout and protected.  The patient was given systemic heparinization.  A bovine carotid patch was selected and prepared on the back table.  A 10 french shunt was also prepared.  After blood pressure readings were appropriate and the heparin  had been given time to circulate, the internal carotid artery was occluded with a baby Gregory clamp.  The external and common carotid arteries were then occluded with vascular clamps and the 2-0 tie tightened on the superior thyroid artery.  A #11 blade was used to make an arteriotomy in the common carotid artery.  This was extended with Potts scissors along the anterior and lateral border of the common and internal carotid artery.  Approximately 90% stenosis was identified.  There was no thrombus identified.  The 10 french shunt was not placed as there was adequate backbleeding from the internal carotid artery.  A kleiner kuntz elevator was used to perform endarterectomy.  An eversion endarterectomy was performed in the external carotid artery.  A good distal endpoint was obtained in the internal carotid artery.  The specimen was removed and sent to pathology.  Heparinized saline was used to irrigate the endarterectomized field.  All potential embolic debris was removed.  Bovine pericardial patch angioplasty was then performed using a running 6-0 Prolene.  The common internal and external carotid arteries were all appropriately flushed. The artery was again irrigated with heparin  saline.  The anastomosis was then secured. The clamp was first released on  the external carotid artery followed by the common carotid artery approximately 30 seconds later, bloodflow was reestablish through the internal carotid artery.  Next, a hand-held  Doppler was used to evaluate the signals in the common,  external, and internal  carotid arteries, all of which had appropriate signals. I then administered  50 mg protamine. The wound was then irrigated.  After hemostasis was achieved, the carotid sheath was reapproximated with 3-0 Vicryl. The  platysma muscle was reapproximated with running 3-0 Vicryl. The skin  was closed with 4-0 Vicryl. Dermabond was placed on the skin. The  patient was then successfully extubated. His neurologic exam was  similar to his preprocedural exam. The patient was then taken to recovery room  in stable condition. There were no complications.     Disposition:  To PACU in stable condition.  Relevant Operative Details: Normal anatomy.  Bifurcation was in the mid neck.  90% stenosis.  No shunt was utilized as there was adequate backbleeding  V. Malvina New, M.D., [facility] [place] Office: [phone] Pager:  [phone]

## 2023-11-20 NOTE — Discharge Instructions (Signed)

## 2023-11-20 NOTE — Anesthesia Procedure Notes (Signed)
 Procedure Name: Intubation Date/Time: 11/20/2023 1:34 PM  Performed by: Julien Manus, CRNAPre-anesthesia Checklist: Patient identified, Emergency Drugs available, Suction available and Patient being monitored Patient Re-evaluated:Patient Re-evaluated prior to induction Oxygen Delivery Method: Circle System Utilized Preoxygenation: Pre-oxygenation with 100% oxygen Induction Type: IV induction Ventilation: Mask ventilation without difficulty Tube type: Oral Tube size: 7.5 mm Number of attempts: 1 Airway Equipment and Method: Stylet and Oral airway Placement Confirmation: ETT inserted through vocal cords under direct vision, positive ETCO2 and breath sounds checked- equal and bilateral Secured at: 23 cm Tube secured with: Tape Dental Injury: Teeth and Oropharynx as per pre-operative assessment

## 2023-11-20 NOTE — Progress Notes (Signed)
 Patient arrived to the unit A/O x4; CCMD called verified; vitals performed; CHG; patient acclimated to the room; call light & phone within reach; bed in lowest position, family at bedside;

## 2023-11-21 ENCOUNTER — Encounter (HOSPITAL_COMMUNITY): Payer: Self-pay | Admitting: Surgery

## 2023-11-21 DIAGNOSIS — Z48812 Encounter for surgical aftercare following surgery on the circulatory system: Secondary | ICD-10-CM

## 2023-11-21 LAB — CBC
HCT: 40.4 % (ref 39.0–52.0)
Hemoglobin: 14.2 g/dL (ref 13.0–17.0)
MCH: 29.7 pg (ref 26.0–34.0)
MCHC: 35.1 g/dL (ref 30.0–36.0)
MCV: 84.5 fL (ref 80.0–100.0)
Platelets: 205 K/uL (ref 150–400)
RBC: 4.78 MIL/uL (ref 4.22–5.81)
RDW: 13.2 % (ref 11.5–15.5)
WBC: 9.6 K/uL (ref 4.0–10.5)
nRBC: 0 % (ref 0.0–0.2)

## 2023-11-21 LAB — BASIC METABOLIC PANEL WITH GFR
Anion gap: 11 (ref 5–15)
BUN: 15 mg/dL (ref 8–23)
CO2: 19 mmol/L — ABNORMAL LOW (ref 22–32)
Calcium: 8.8 mg/dL — ABNORMAL LOW (ref 8.9–10.3)
Chloride: 104 mmol/L (ref 98–111)
Creatinine, Ser: 1.16 mg/dL (ref 0.61–1.24)
GFR, Estimated: >60 mL/min (ref >60)
Glucose, Bld: 141 mg/dL — ABNORMAL HIGH (ref 70–99)
Potassium: 3.9 mmol/L (ref 3.5–5.1)
Sodium: 134 mmol/L — ABNORMAL LOW (ref 135–145)

## 2023-11-21 LAB — LIPID PANEL
Cholesterol: 135 mg/dL (ref 0–200)
HDL: 48 mg/dL (ref >40)
LDL Cholesterol: 73 mg/dL (ref 0–99)
Total CHOL/HDL Ratio: 2.8 ratio
Triglycerides: 68 mg/dL (ref <150)
VLDL: 14 mg/dL (ref 0–40)

## 2023-11-21 MED ORDER — DIPHENHYDRAMINE HCL 25 MG PO CAPS
25.0000 mg | ORAL_CAPSULE | Freq: Four times a day (QID) | ORAL | Status: DC | PRN
Start: 2023-11-21 — End: 2023-11-22
  Administered 2023-11-21 (×2): 25 mg via ORAL
  Filled 2023-11-21 (×2): qty 1

## 2023-11-21 MED ORDER — LABETALOL HCL 5 MG/ML IV SOLN: 10.0000 mg | INTRAVENOUS | Status: DC | PRN

## 2023-11-21 MED ORDER — METOPROLOL TARTRATE 12.5 MG HALF TABLET
12.5000 mg | ORAL_TABLET | Freq: Every day | ORAL | Status: DC
  Administered 2023-11-21: 12.5 mg via ORAL
  Filled 2023-11-21: qty 1

## 2023-11-21 MED ORDER — HYDRALAZINE HCL 20 MG/ML IJ SOLN
5.0000 mg | INTRAMUSCULAR | Status: AC | PRN
  Administered 2023-11-21 – 2023-11-22 (×2): 5 mg via INTRAVENOUS
  Filled 2023-11-21 (×2): qty 1

## 2023-11-21 MED ORDER — DIAZEPAM 2 MG PO TABS
1.0000 mg | ORAL_TABLET | Freq: Two times a day (BID) | ORAL | Status: DC | PRN
  Administered 2023-11-21: 1 mg via ORAL
  Filled 2023-11-21: qty 1

## 2023-11-21 MED ORDER — METOPROLOL TARTRATE 25 MG PO TABS
25.0000 mg | ORAL_TABLET | Freq: Every day | ORAL | Status: DC
  Administered 2023-11-21 – 2023-11-22 (×2): 25 mg via ORAL
  Filled 2023-11-21 (×2): qty 1

## 2023-11-21 NOTE — Progress Notes (Addendum)
   11/21/23 2030  Vitals  BP (!) 181/97  MAP (mmHg) 121  BP Location Right Arm  BP Method Automatic  Patient Position (if appropriate) Lying  ECG Heart Rate 85  Resp 16  MEWS Score  MEWS Temp 0  MEWS Systolic 0  MEWS Pulse 0  MEWS RR 0  MEWS LOC 0  MEWS Score 0  MEWS Score Color Green   Spoke To [employer] PA, pt blood pressure still 180's systolic after Labetalol with no prn doses left. Also alerted that pt is anxious about burning in stomach and chest that the patient attributes to labetalol. New orders received and told to call back if BP is greater than 200 with prn meds.

## 2023-11-21 NOTE — Progress Notes (Signed)
   11/21/23 2310  Vitals  Temp 98.4 F (36.9 C)  Temp Source Oral  BP (!) 193/99  MAP (mmHg) 126  BP Location Right Arm  BP Method Automatic  Patient Position (if appropriate) Lying  Pulse Rate 95  Pulse Rate Source Monitor  ECG Heart Rate 99  Resp 20   5 of hydralazine given per MD order

## 2023-11-21 NOTE — Progress Notes (Signed)
    Subjective  - POD # 1, status post left carotid enterectomy  Feels weak and gets dizzy when he stands up Has walked in the hallways Has tolerated clears   Physical Exam:  Left carotid incision without hematoma Neurologically intact.  No focal deficits       Assessment/Plan:  POD #1  Status post left carotid endarterectomy for asymptomatic disease.  He appears to be doing well this morning without focal deficits however he is complaining of weakness and dizziness especially when he stands up.  I suspect this is a blood pressure issue or secondary to anesthesia.  Our plan is for him to eat breakfast and walk today to see if this feeling goes away.  If he continues to have issues he may need to have further imaging.  He will follow-up with me in the office in 2 to 3 weeks to discuss right sided intervention  [name] 11/21/2023 7:05 AM --  Vitals:   11/21/23 0300 11/21/23 0340  BP: (!) 125/57   Pulse: 72 68  Resp: 15 19  Temp:  97.7 F (36.5 C)  SpO2: 95% 96%    Intake/Output Summary (Last 24 hours) at 11/21/2023 0705 Last data filed at 11/21/2023 0655 Gross per 24 hour  Intake 1102.27 ml  Output 1250 ml  Net -147.73 ml     Laboratory CBC    Component Value Date/Time   WBC 9.6 11/21/2023 0355   HGB 14.2 11/21/2023 0355   HCT 40.4 11/21/2023 0355   PLT 205 11/21/2023 0355    BMET    Component Value Date/Time   NA 134 (L) 11/21/2023 0355   K 3.9 11/21/2023 0355   CL 104 11/21/2023 0355   CO2 19 (L) 11/21/2023 0355   GLUCOSE 141 (H) 11/21/2023 0355   BUN 15 11/21/2023 0355   CREATININE 1.16 11/21/2023 0355   CREATININE 1.27 (H) 03/05/2016 0904   CALCIUM  8.8 (L) 11/21/2023 0355   GFRNONAA >60 11/21/2023 0355   GFRAA >60 10/09/2018 1452    COAG Lab Results  Component Value Date   INR 1.0 11/15/2023   INR 1.09 07/19/2015   INR 1.53 (H) 08/29/2010   No results found for: PTT  Antibiotics Anti-infectives (From admission, onward)     Start     Dose/Rate Route Frequency Ordered Stop   11/20/23 1815  vancomycin  (VANCOCIN ) IVPB 1000 mg/200 mL premix        1,000 mg 200 mL/hr over 60 Minutes Intravenous Every 12 hours 11/20/23 1723 11/21/23 1814   11/20/23 0840  vancomycin  (VANCOCIN ) IVPB 1000 mg/200 mL premix        1,000 mg 200 mL/hr over 60 Minutes Intravenous 60 min pre-op 11/20/23 0840 11/20/23 1017        V. Malvina Serene CLORE, M.D., [facility] [place] Office: [phone] Pager:  [phone]

## 2023-11-21 NOTE — Plan of Care (Signed)
  Problem: Education: Goal: Knowledge of General Education information will improve Description: Including pain rating scale, medication(s)/side effects and non-pharmacologic comfort measures Outcome: Progressing   Problem: Health Behavior/Discharge Planning: Goal: Ability to manage health-related needs will improve Outcome: Progressing   Problem: Clinical Measurements: Goal: Ability to maintain clinical measurements within normal limits will improve Outcome: Progressing Goal: Will remain free from infection Outcome: Progressing Goal: Diagnostic test results will improve Outcome: Progressing Goal: Respiratory complications will improve Outcome: Progressing Goal: Cardiovascular complication will be avoided Outcome: Progressing   Problem: Activity: Goal: Risk for activity intolerance will decrease Outcome: Progressing   Problem: Nutrition: Goal: Adequate nutrition will be maintained Outcome: Progressing   Problem: Coping: Goal: Level of anxiety will decrease Outcome: Progressing   Problem: Elimination: Goal: Will not experience complications related to bowel motility Outcome: Progressing Goal: Will not experience complications related to urinary retention Outcome: Progressing   Problem: Pain Managment: Goal: General experience of comfort will improve and/or be controlled Outcome: Progressing   Problem: Safety: Goal: Ability to remain free from injury will improve Outcome: Progressing   Problem: Skin Integrity: Goal: Risk for impaired skin integrity will decrease Outcome: Progressing   Problem: Education: Goal: Knowledge of discharge needs will improve Outcome: Progressing   Problem: Clinical Measurements: Goal: Postoperative complications will be avoided or minimized Outcome: Progressing   Problem: Respiratory: Goal: Will achieve and/or maintain a regular respiratory rate, without signs or symptoms of dyspnea Outcome: Progressing   Problem: Skin  Integrity: Goal: Demonstration of wound healing without infection will improve Outcome: Progressing   Problem: Education: Goal: Knowledge of disease or condition will improve Outcome: Progressing Goal: Knowledge of secondary prevention will improve (MUST DOCUMENT ALL) Outcome: Progressing Goal: Knowledge of patient specific risk factors will improve (DELETE if not current risk factor) Outcome: Progressing   Problem: Ischemic Stroke/TIA Tissue Perfusion: Goal: Complications of ischemic stroke/TIA will be minimized Outcome: Progressing   Problem: Coping: Goal: Will verbalize positive feelings about self Outcome: Progressing Goal: Will identify appropriate support needs Outcome: Progressing   Problem: Health Behavior/Discharge Planning: Goal: Ability to manage health-related needs will improve Outcome: Progressing Goal: Goals will be collaboratively established with patient/family Outcome: Progressing   Problem: Self-Care: Goal: Ability to participate in self-care as condition permits will improve Outcome: Progressing Goal: Verbalization of feelings and concerns over difficulty with self-care will improve Outcome: Progressing Goal: Ability to communicate needs accurately will improve Outcome: Progressing   Problem: Nutrition: Goal: Risk of aspiration will decrease Outcome: Progressing Goal: Dietary intake will improve Outcome: Progressing

## 2023-11-21 NOTE — Progress Notes (Signed)
 PHARMACIST LIPID MONITORING   [name] is a 78 y.o. male admitted on 11/20/2023 with carotid stenosis .  Pharmacy has been consulted to optimize lipid-lowering therapy with the indication of secondary prevention for clinical ASCVD.  Recent Labs:  Lipid Panel (last 6 months):   Lab Results  Component Value Date   CHOL 135 11/21/2023   TRIG 68 11/21/2023   HDL 48 11/21/2023   CHOLHDL 2.8 11/21/2023   VLDL 14 11/21/2023   LDLCALC 73 11/21/2023    Hepatic function panel (last 6 months):   Lab Results  Component Value Date   AST 25 11/15/2023   ALT 23 11/15/2023   ALKPHOS 71 11/15/2023   BILITOT 0.6 11/15/2023    SCr (since admission):   Serum creatinine: 1.16 mg/dL 88/86/74 9644 Estimated creatinine clearance: 54.2 mL/min  Current therapy and lipid therapy tolerance Current lipid-lowering therapy: crestor  10mg /d Previous lipid-lowering therapies (if applicable): Zetia  Documented or reported allergies or intolerances to lipid-lowering therapies (if applicable): 'elevated muscle enzymes'  I spoke with him and he would prefer further lipid management by his primary care provider or cardiology.   Plan:   -Per patient request, I defer changes in therapy to his outpatient providers -Consider referral to lipid clinic      Prentice Poisson, PharmD Clinical Pharmacist **Pharmacist phone directory can now be found on [email or web address] ([email or web address]).  Listed under [facility].

## 2023-11-21 NOTE — Anesthesia Postprocedure Evaluation (Signed)
 Anesthesia Post Note  Patient: [name]  Procedure(s) Performed: ENDARTERECTOMY, CAROTID with BIOPATCH (Left: Neck)     Patient location during evaluation: PACU Anesthesia Type: General Level of consciousness: awake Pain management: pain level controlled Vital Signs Assessment: post-procedure vital signs reviewed and stable Respiratory status: spontaneous breathing and nonlabored ventilation Cardiovascular status: blood pressure returned to baseline Postop Assessment: no apparent nausea or vomiting Anesthetic complications: no   No notable events documented.                Lauraine KATHEE [place]

## 2023-11-22 ENCOUNTER — Other Ambulatory Visit (HOSPITAL_COMMUNITY): Payer: Self-pay

## 2023-11-22 LAB — BASIC METABOLIC PANEL WITH GFR
Anion gap: 12 (ref 5–15)
BUN: 15 mg/dL (ref 8–23)
CO2: 23 mmol/L (ref 22–32)
Calcium: 8.7 mg/dL — ABNORMAL LOW (ref 8.9–10.3)
Chloride: 104 mmol/L (ref 98–111)
Creatinine, Ser: 1.21 mg/dL (ref 0.61–1.24)
GFR, Estimated: >60 mL/min (ref >60)
Glucose, Bld: 104 mg/dL — ABNORMAL HIGH (ref 70–99)
Potassium: 3.7 mmol/L (ref 3.5–5.1)
Sodium: 139 mmol/L (ref 135–145)

## 2023-11-22 MED ORDER — LOSARTAN POTASSIUM 50 MG PO TABS
75.0000 mg | ORAL_TABLET | Freq: Every day | ORAL | Status: DC
  Administered 2023-11-22: 75 mg via ORAL
  Filled 2023-11-22: qty 1

## 2023-11-22 MED ORDER — OXYCODONE-ACETAMINOPHEN 5-325 MG PO TABS
1.0000 | ORAL_TABLET | Freq: Four times a day (QID) | ORAL | 0 refills | Status: DC | PRN
  Filled 2023-11-22: qty 12, 3d supply, fill #0

## 2023-11-22 MED ORDER — LOSARTAN POTASSIUM 50 MG PO TABS
75.0000 mg | ORAL_TABLET | Freq: Every day | ORAL | 1 refills | Status: DC
  Filled 2023-11-22: qty 45, 30d supply, fill #0

## 2023-11-22 NOTE — Discharge Summary (Signed)
 Carotid Discharge Summary     [name] 1945/03/05 78 y.o. male  [id number]  Admission Date: 11/20/2023  Discharge Date: 11/22/2023  Physician: Gaile Malvina New, MD  Admission Diagnosis: Carotid stenosis, bilateral [I65.23] Status post carotid endarterectomy [Z98.890] Carotid stenosis, asymptomatic, left [I65.22]  Discharge Day services:   Mobility specialist [facility] RN  Hospital Course:  The patient was admitted to the hospital and taken to the operating room on 11/20/2023 and underwent left carotid endarterectomy.  The pt tolerated the procedure well and was transported to the PACU in good condition.  By POD 1, the pt neuro status remained intact. Had some weakness and dizziness when standing. Tolerated walking in hallway otherwise. Tolerating diet. Left neck incision well appearing without hematoma. His blood pressures remained very elevated with systolic's in the 180's. Patient reported being very anxious. Was given several doses of IV labetalol and hydralazine. To control his hypertension. His home blood pressure medications were also resumed.   POD 2, continued hypertension. Blood pressure overall labile. Some intermittently normal pressures followed by very elevated. Patient overall feeling better. Some continued generalized weakness but his dizziness resolved. Remained neurologically intact. Left neck incision well appearing. Some frustration regarding his blood pressure. His home Losartan  dose was increased to 75 mg. Was previously on 100 mg and was more recently reduced to 50. The remainder of the hospital course consisted of increasing mobilization and increasing intake of solids without difficulty.  He remained stable for discharge home later on POD#2. Systolic pressures remained <140. Asymptomatic. He will resume home medications as prescribed including Aspirin  and statin. PDMP was reviewed and post operative pain medication was sent to his pharmacy. He will follow  up in our office in 2-3 weeks for incision check.    Recent Labs    11/21/23 0355 11/22/23 0243  NA 134* 139  K 3.9 3.7  CL 104 104  CO2 19* 23  GLUCOSE 141* 104*  BUN 15 15  CALCIUM  8.8* 8.7*   Recent Labs    11/20/23 1747 11/21/23 0355  WBC 9.4 9.6  HGB 15.9 14.2  HCT 46.6 40.4  PLT 208 205   No results for input(s): INR in the last 72 hours.   Discharge Instructions     CAROTID Sugery: Call MD for difficulty swallowing or speaking; weakness in arms or legs that is a new symtom; severe headache.  If you have increased swelling in the neck and/or  are having difficulty breathing, CALL 911   Complete by: As directed    Call MD for:  redness, tenderness, or signs of infection (pain, swelling, bleeding, redness, odor or green/yellow discharge around incision site)   Complete by: As directed    Call MD for:  severe or increased pain, loss or decreased feeling  in affected limb(s)   Complete by: As directed    Call MD for:  temperature >100.5   Complete by: As directed    Discharge wound care:   Complete by: As directed    Keep incision dry for 24 hours. You can then wash with mild soap and water, pat dry. Do not soak in bathtub, pool, etc   Driving Restrictions   Complete by: As directed    No driving for 2 weeks or while taking narcotic pain medication   Increase activity slowly   Complete by: As directed    Walk with assistance use walker or cane as needed   Lifting restrictions   Complete by: As directed  No lifting for 4 weeks   Resume previous diet   Complete by: As directed        Discharge Diagnosis:  Carotid stenosis, bilateral [I65.23] Status post carotid endarterectomy [Z98.890] Carotid stenosis, asymptomatic, left [I65.22]  Secondary Diagnosis: Patient Active Problem List   Diagnosis Date Noted   Status post carotid endarterectomy 11/20/2023   Carotid stenosis, asymptomatic, left 11/20/2023   Episode of dizziness 01/11/2022   Prostate  cancer (HCC) 10/13/2018   Malignant neoplasm of prostate (HCC) 09/16/2018   Preoperative cardiovascular examination 09/11/2018   Coronary artery disease involving native coronary artery of native heart without angina pectoris 03/16/2014   Myalgia 03/16/2014   Dyslipidemia (high LDL; low HDL)    Hypertension - labile    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   CAD in native artery; Left Main and LAD and occluded RPDA; referred for CABG 07/22/2010   Past Medical History:  Diagnosis Date   Acquired trigger finger of both middle fingers    Acquired trigger finger of both ring fingers    Arthritis    CAD in native artery 08/2010   Cardiac CATH for Abn EKG- GXT portio of Myoview : Diffuse market Inferior ST depressions & STE in V1 and V2; NO scintigraphic evidence of ischemia  => CATH (MV CAD): 70% d LM, 90% ost LAD & 60-70% pLAD; Minimal LCx & p-d RCA but 99% ostrPDA ( BALANCED ISCHEMIA) => CABGx4 (LIMA-LAD, SVG-DiaG, SVG-OM, SVG-rPDA; Myoivew 11/2020 - EF 65-70%, No Ischemia/Infarction.   Cancer (HCC)    skin cancers-squamous cell / facial    COVID    x2   Dyslipidemia - low HDL    Lipids monitored by Dr. Clarice.   GERD (gastroesophageal reflux disease)    History of multiple allergies    History of vertigo    Hypertension    Labile   Low testosterone     Recently stopped replacement therapy.   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   Urinary incontinence, urge    Wears glasses     Allergies as of 11/22/2023       Reactions   Cephalosporins Palpitations   Hydrochlorothiazide -triamterene Palpitations   Statins Other (See Comments)    elevated muscle enzymes Lipitor   Vioxx [rofecoxib] Swelling   Facial swelling   Zetia  [ezetimibe ]    Increased muscle enzymes; GI upset   Lisinopril Cough   Sulfa Antibiotics Rash        Medication List     STOP taking these medications    traMADol   50 MG tablet Commonly known as: ULTRAM        TAKE these medications    ACETAMINOPHEN  PO Take 650 mg by mouth daily as needed for moderate pain (pain score 4-6) or headache. Take 2  Daily as needed   aspirin  EC 81 MG tablet Take 81 mg by mouth daily. Swallow whole.   CALCIUM -VITAMIN D PO Take 1 tablet by mouth every Monday, Wednesday, and Friday.   Coenzyme Q10 100 MG capsule Take 100 mg by mouth daily at 12 noon.   COLD AND FLU PO Take 15 mLs by mouth at bedtime.   diazepam 2 MG tablet Commonly known as: VALIUM Take 1 mg by mouth daily as needed for anxiety.   diphenhydrAMINE  25 MG tablet Commonly known as: BENADRYL  Take 25 mg by mouth daily as  needed for allergies.   docusate sodium  100 MG capsule Commonly known as: COLACE Take 100 mg by mouth daily.   doxycycline 100 MG capsule Commonly known as: VIBRAMYCIN Take 100 mg by mouth daily as needed (rosacea).   famotidine 20 MG tablet Commonly known as: PEPCID Take 20 mg by mouth every other day.   ipratropium 0.06 % nasal spray Commonly known as: ATROVENT Place 2 sprays into both nostrils 3 (three) times daily as needed.   losartan  50 MG tablet Commonly known as: COZAAR  Take 1.5 tablets (75 mg total) by mouth daily. What changed: how much to take   LUBRICATING EYE DROPS OP Place 1 drop into both eyes daily as needed (dry eyes).   metoprolol  tartrate 25 MG tablet Commonly known as: LOPRESSOR  TAKE 1 TABLET TWICE A DAY What changed:  when to take this additional instructions   Multivitamin Childrens Chew Chew 1 tablet by mouth daily at 12 noon.   oxyCODONE -acetaminophen  5-325 MG tablet Commonly known as: Percocet Take 1 tablet by mouth every 6 (six) hours as needed for severe pain (pain score 7-10).   pantoprazole  40 MG tablet Commonly known as: PROTONIX  Take 40 mg by mouth every other day.   pseudoephedrine 30 MG tablet Commonly known as: SUDAFED Take 30 mg by mouth every 4 (four) hours as needed  for congestion.   rosuvastatin  10 MG tablet Commonly known as: CRESTOR  Take 10 mg by mouth at bedtime.   sildenafil 20 MG tablet Commonly known as: REVATIO Take 20 mg by mouth daily as needed.               Discharge Care Instructions  (From admission, onward)           Start     Ordered   11/22/23 0000  Discharge wound care:       Comments: Keep incision dry for 24 hours. You can then wash with mild soap and water, pat dry. Do not soak in bathtub, pool, etc   11/22/23 1414             Discharge Instructions:   [facility] Discharge Instructions Carotid Endarterectomy (CEA)  Please refer to the following instructions for your post-procedure care. Your surgeon or physician assistant will discuss any changes with you.  Activity  You are encouraged to walk as much as you can. You can slowly return to normal activities but must avoid strenuous activity and heavy lifting until your doctor tell you it's OK. Avoid activities such as vacuuming or swinging a golf club. You can drive after one week if you are comfortable and you are no longer taking prescription pain medications. It is normal to feel tired for serval weeks after your surgery. It is also normal to have difficulty with sleep habits, eating, and bowel movements after surgery. These will go away with time.  Bathing/Showering  You may shower after you come home. Do not soak in a bathtub, hot tub, or swim until the incision heals completely.  Incision Care  Shower every day. Clean your incision with mild soap and water. Pat the area dry with a clean towel. You do not need a bandage unless otherwise instructed. Do not apply any ointments or creams to your incision. You may have skin glue on your incision. Do not peel it off. It will come off on its own in about one week. Your incision may feel thickened and raised for several weeks after your surgery. This is normal  and the skin  will soften over time. For Men Only: It's OK to shave around the incision but do not shave the incision itself for 2 weeks. It is common to have numbness under your chin that could last for several months.  Diet  Resume your normal diet. There are no special food restrictions following this procedure. A low fat/low cholesterol diet is recommended for all patients with vascular disease. In order to heal from your surgery, it is CRITICAL to get adequate nutrition. Your body requires vitamins, minerals, and protein. Vegetables are the best source of vitamins and minerals. Vegetables also provide the perfect balance of protein. Processed food has little nutritional value, so try to avoid this.  Medications  Resume taking all of your medications unless your doctor or physician assistant tells you not to.  If your incision is causing pain, you may take over-the- counter pain relievers such as acetaminophen  (Tylenol ). If you were prescribed a stronger pain medication, please be aware these medications can cause nausea and constipation.  Prevent nausea by taking the medication with a snack or meal. Avoid constipation by drinking plenty of fluids and eating foods with a high amount of fiber, such as fruits, vegetables, and grains. Do not take Tylenol  if you are taking prescription pain medications.  Follow Up  Our office will schedule a follow up appointment 2-3 weeks following discharge.  Please call us  immediately for any of the following conditions  Increased pain, redness, drainage (pus) from your incision site. Fever of 101 degrees or higher. If you should develop stroke (slurred speech, difficulty swallowing, weakness on one side of your body, loss of vision) you should call 911 and go to the nearest emergency room.  Reduce your risk of vascular disease:  Stop smoking. If you would like help call [facility] at 1-800-QUIT-NOW (4254961145) or [place] at [phone]. Manage your  cholesterol Maintain a desired weight Control your diabetes Keep your blood pressure down  If you have any questions, please call the office at [phone].  Prescriptions given: Losartan  75 mg #45 1 refill Percocet 5-325 mg #12 no refills  Disposition: home  Patient's condition: is Good  Follow up: 1. Dr. Serene in 2-3 weeks.   Haylea Schlichting PA-C [facility] [phone]   --- For [facility] Registry use ---   Modified Rankin score at D/C (0-6): 0  IV medication needed for:  1. Hypertension: Yes 2. Hypotension: No  Post-op Complications: No  1. Post-op CVA or TIA: No  If yes: Event classification (right eye, left eye, right cortical, left cortical, verterobasilar, other): n/a  If yes: Timing of event (intra-op, <6 hrs post-op, >=6 hrs post-op, unknown): n/a  2. CN injury: No  If yes: CN not injuried   3. Myocardial infarction: No  If yes: Dx by (EKG or clinical, Troponin): n/a  4.  CHF: No  5.  Dysrhythmia (new): No  6. Wound infection: No  7. Reperfusion symptoms: No  8. Return to OR: No  If yes: return to OR for (bleeding, neurologic, other CEA incision, other): n/a  Discharge medications: Statin use:  Yes ASA use:  Yes   Beta blocker use:  Yes ACE-Inhibitor use:  No  ARB use:  No CCB use: No P2Y12 Antagonist use: No, [ ]  Plavix, [ ]  Plasugrel, [ ]  Ticlopinine, [ ]  Ticagrelor, [ ]  Other, [ ]  No for medical reason, [ ]  Non-compliant, [ ]  Not-indicated Anti-coagulant use:  No, [ ]  Warfarin, [ ]  Rivaroxaban, [ ]   Dabigatran,

## 2023-11-22 NOTE — TOC Transition Note (Signed)
 Transition of Care [facility]) - Discharge Note   Patient Details  Name: [name] MRN: [id number] Date of Birth: 1945-06-29  Transition of [facility]) CM/SW Contact:  Waddell Barnie Rama, RN Phone Number: 11/22/2023, 2:57 PM   Clinical Narrative:    For dc , wife at bedside to transport home. No needs.         Patient Goals and CMS Choice            Discharge Placement                       Discharge Plan and Services Additional resources added to the After Visit Summary for                                       Social Drivers of Health (SDOH) Interventions SDOH Screenings   Food Insecurity: No Food Insecurity (11/20/2023)  Housing: Low Risk  (11/20/2023)  Transportation Needs: No Transportation Needs (11/20/2023)  Utilities: Not At Risk (11/20/2023)  Social Connections: Socially Integrated (11/20/2023)  Tobacco Use: Medium Risk (11/20/2023)     Readmission Risk Interventions     No data to display

## 2023-11-22 NOTE — Progress Notes (Signed)
 Mobility Specialist Progress Note:    11/22/23 0933  Mobility  Activity Ambulated independently  Level of Assistance Independent  Assistive Device None  Distance Ambulated (ft) 330 ft  Activity Response Tolerated well  Mobility Referral Yes  Mobility visit 1 Mobility  Mobility Specialist Start Time (ACUTE ONLY) 0933  Mobility Specialist Stop Time (ACUTE ONLY) 0940  Mobility Specialist Time Calculation (min) (ACUTE ONLY) 7 min   Pt received dangling EOB, agreeable to ambulate in hallway. Tolerated well, denies pain or dizziness. Returned to room, sitting up in chair by sink for bath. All needs met. See BP below.  Pre Mobility: 143/75 (92) Post Mobility: 191/89 (116)  [name] Mobility Specialist Please contact via Special Educational Needs Teacher or  Rehab office at [phone]

## 2023-11-22 NOTE — TOC CM/SW Note (Signed)
 Transition of Care [facility]) - Inpatient Brief Assessment   Patient Details  Name: [name] MRN: [id number] Date of Birth: May 19, 1945  [facility] CM/SW Contact:    Waddell Barnie Rama, RN Phone Number: 11/22/2023, 2:56 PM   Clinical Narrative: From home with spouse, has PCP and insurance on file, states has no HH services in place at this time or DME at home.  States family member will transport them home at [employer] and family is support system,   Pta self ambulatory.   There are no ICM needs identified  at this time.  Please place consult for ICM needs. Patient states that he already has a follow up apt with his PCP in December.    Transition of Care Asessment: Insurance and Status: Insurance coverage has been reviewed Patient has primary care physician: Yes Home environment has been reviewed: home with wife Prior level of function:: indep Prior/Current Home Services: No current home services Social Drivers of Health Review: SDOH reviewed no interventions necessary Readmission risk has been reviewed: Yes Transition of care needs: no transition of care needs at this time

## 2023-11-22 NOTE — Progress Notes (Addendum)
  Progress Note    11/22/2023 8:03 AM 2 Days Post-Op  Subjective:  sitting up on side of bed eating breakfast. Frustrated about his blood pressure and staying in hospital. Has not yet ambulated this morning   Vitals:   11/22/23 0333 11/22/23 0744  BP: (!) 126/56 134/76  Pulse:  (!) 105  Resp: 15 14  Temp:  98.1 F (36.7 C)  SpO2:  97%   Physical Exam: Cardiac:  tachy Lungs:  non labored Incisions:  left neck incision c/d/I. Very well appearing Extremities:  moving all extremities without deficits Abdomen:  soft Neurologic: alert and oriented. Speech coherent  CBC    Component Value Date/Time   WBC 9.6 11/21/2023 0355   RBC 4.78 11/21/2023 0355   HGB 14.2 11/21/2023 0355   HCT 40.4 11/21/2023 0355   PLT 205 11/21/2023 0355   MCV 84.5 11/21/2023 0355   MCH 29.7 11/21/2023 0355   MCHC 35.1 11/21/2023 0355   RDW 13.2 11/21/2023 0355   LYMPHSABS 1.6 07/19/2015 1415   MONOABS 0.7 07/19/2015 1415   EOSABS 0.1 07/19/2015 1415   BASOSABS 0.0 07/19/2015 1415    BMET    Component Value Date/Time   NA 139 11/22/2023 0243   K 3.7 11/22/2023 0243   CL 104 11/22/2023 0243   CO2 23 11/22/2023 0243   GLUCOSE 104 (H) 11/22/2023 0243   BUN 15 11/22/2023 0243   CREATININE 1.21 11/22/2023 0243   CREATININE 1.27 (H) 03/05/2016 0904   CALCIUM  8.7 (L) 11/22/2023 0243   GFRNONAA >60 11/22/2023 0243   GFRAA >60 10/09/2018 1452    INR    Component Value Date/Time   INR 1.0 11/15/2023 1400     Intake/Output Summary (Last 24 hours) at 11/22/2023 0803 Last data filed at 11/21/2023 1915 Gross per 24 hour  Intake 360 ml  Output 700 ml  Net -340 ml     Assessment/Plan:  78 y.o. male is s/p Left CEA 2 Days Post-Op   Neurologically intact BP remains labile Tachycardic. Some intermittent PVCs Possible discharge home later today  Want him to ambulate in hall to see if he has continued weakness or dizziness May need Cardiology to see if continued issues Will arrange  outpatient follow up in 2-3 weeks for incision check   Teretha Damme, PA-C [facility] [phone] 11/22/2023 8:03 AM  I agree with the above. POD#2.  Still having issues with hypertension.  No focal neuro deficits. His dizziness has resolved,  Will increase Losartan  to 75 mg, as he used to be on 100 mg, but had episodes of hypotension.  Anticipate discharge home today  Malvina New

## 2023-11-22 NOTE — Progress Notes (Signed)
   11/22/23 0246  Vitals  Temp 98.2 F (36.8 C)  Temp Source Oral  BP (!) 171/79  MAP (mmHg) 103  BP Location Right Arm  BP Method Automatic  Patient Position (if appropriate) Lying  Pulse Rate 100  Pulse Rate Source Monitor  ECG Heart Rate 100  Resp 17   5 mg hydralazine given per MD order

## 2023-12-16 ENCOUNTER — Other Ambulatory Visit: Payer: Self-pay

## 2023-12-16 ENCOUNTER — Ambulatory Visit: Attending: Surgery | Admitting: Surgery

## 2023-12-16 ENCOUNTER — Encounter: Payer: Self-pay | Admitting: Surgery

## 2023-12-16 ENCOUNTER — Ambulatory Visit: Admitting: Surgery

## 2023-12-16 VITALS — BP 163/88 | HR 68 | Temp 98.0°F | Ht 70.0 in | Wt 178.0 lb

## 2023-12-16 DIAGNOSIS — I6523 Occlusion and stenosis of bilateral carotid arteries: Secondary | ICD-10-CM

## 2023-12-16 NOTE — H&P (View-Only) (Signed)
 Patient name: [name] MRN: [id number] DOB: 06-20-1945 Sex: male  REASON FOR VISIT:     Follow up  HISTORY OF PRESENT ILLNESS:   [name] is a 77 y.o. male with bilateral asymptomatic carotid disease.  He underwent left carotid endarterectomy on 11/20/2023 and was found to have a 90% stenosis.  His recovery was complicated by hypertension.  He also had some dizziness which spontaneously resolved.  He is feeling better today and increasing his activity.  He is feeling much better.  He has had no neurologic changes.  He is still working on his blood pressure and is scheduled to see his PCP and cardiologist this week   He suffers from coronary disease, status post CABG in 2012. He is medically managed for hypertension with an ARB. He is on a statin for hypercholesterolemia, however he does not tolerate high-dose therapy. He is a non-smoker. He has a history of prostate cancer   CURRENT MEDICATIONS:    Current Outpatient Medications  Medication Sig Dispense Refill   ACETAMINOPHEN  PO Take 650 mg by mouth daily as needed for moderate pain (pain score 4-6) or headache. Take 2  Daily as needed     aspirin  EC 81 MG tablet Take 81 mg by mouth daily. Swallow whole.     Calcium  Carb-Cholecalciferol (CALCIUM -VITAMIN D PO) Take 1 tablet by mouth every Monday, Wednesday, and Friday.     Coenzyme Q10 100 MG capsule Take 100 mg by mouth daily at 12 noon.     diazepam  (VALIUM ) 2 MG tablet Take 1 mg by mouth daily as needed for anxiety.     diphenhydrAMINE  (BENADRYL ) 25 MG tablet Take 25 mg by mouth daily as needed for allergies.      docusate sodium  (COLACE) 100 MG capsule Take 100 mg by mouth daily.     doxycycline (VIBRAMYCIN) 100 MG capsule Take 100 mg by mouth daily as needed (rosacea).      famotidine  (PEPCID ) 20 MG tablet Take 20 mg by mouth every other day.     ipratropium (ATROVENT) 0.06 % nasal spray Place 2 sprays into both nostrils 3 (three) times  daily as needed.     losartan  (COZAAR ) 50 MG tablet Take 1.5 tablets (75 mg total) by mouth daily. 45 tablet 1   metoprolol  tartrate (LOPRESSOR ) 25 MG tablet TAKE 1 TABLET TWICE A DAY (Patient taking differently: Take 25 mg by mouth See admin instructions. 1 tablet in the morning and 0.5 tablet at night around 10pm) 180 tablet 1   Nutritional Supplements (COLD AND FLU PO) Take 15 mLs by mouth at bedtime.     oxyCODONE -acetaminophen  (PERCOCET) 5-325 MG tablet Take 1 tablet by mouth every 6 (six) hours as needed for severe pain (pain score 7-10). 12 tablet 0   pantoprazole  (PROTONIX ) 40 MG tablet Take 40 mg by mouth every other day.     Pediatric Multiple Vitamins (MULTIVITAMIN CHILDRENS) CHEW Chew 1 tablet by mouth daily at 12 noon.     Polyethyl Glycol-Propyl Glycol (LUBRICATING EYE DROPS OP) Place 1 drop into both eyes daily as needed (dry eyes).     pseudoephedrine (SUDAFED) 30 MG tablet Take 30 mg by mouth every 4 (four) hours as needed for congestion.     rosuvastatin  (CRESTOR ) 10 MG tablet Take 10 mg by mouth at bedtime.     sildenafil (REVATIO) 20 MG tablet Take 20 mg by mouth daily as needed.      No current facility-administered medications for this visit.  REVIEW OF SYSTEMS:   [X]  denotes positive finding, [ ]  denotes negative finding Cardiac  Comments:  Chest pain or chest pressure:    Shortness of breath upon exertion:    Short of breath when lying flat:    Irregular heart rhythm:    Constitutional    Fever or chills:      PHYSICAL EXAM:   There were no vitals filed for this visit.  GENERAL: The patient is a well-nourished male, in no acute distress. The vital signs are documented above. CARDIOVASCULAR: There is a regular rate and rhythm. PULMONARY: Non-labored respirations Incision healing nicely  STUDIES:   CTA: 1. Positive High-grade ICA origin/bulb stenoses in the neck with BILATERAL Radiographic-STRING-Signs. Both ICAs patent but with asymmetric diminished  LICA distal caliber and enhancement. And evidence of left > right ICA siphon atherosclerosis also. 2. Advanced atherosclerosis also of both Vertebral Arteries which remain patent. Severe stenoses of the dominant left vertebral artery origin, and the nondominant distal right vertebral V4. Moderate additional distal left artery V4 segment.   MEDICAL ISSUES:   Carotid stenosis: The patient has successfully undergone left carotid endarterectomy.  He still has a high-grade stenosis on the right which is also asymptomatic.  Prior to carotid arterectomy I will send him to ENT for vocal cord evaluation.  Assuming this is unremarkable, I have him scheduled for right carotid endarterectomy on Wednesday, January 7.  He did have a reaction to vancomycin  and also has a cephalosporin allergy, and so we will need to discuss perioperative antibiotics  Malvina New, IV, MD, FACS [facility] [phone] Pager [phone]

## 2023-12-16 NOTE — Progress Notes (Signed)
 Patient name: [name] MRN: [id number] DOB: 06-20-1945 Sex: male  REASON FOR VISIT:     Follow up  HISTORY OF PRESENT ILLNESS:   [name] is a 77 y.o. male with bilateral asymptomatic carotid disease.  He underwent left carotid endarterectomy on 11/20/2023 and was found to have a 90% stenosis.  His recovery was complicated by hypertension.  He also had some dizziness which spontaneously resolved.  He is feeling better today and increasing his activity.  He is feeling much better.  He has had no neurologic changes.  He is still working on his blood pressure and is scheduled to see his PCP and cardiologist this week   He suffers from coronary disease, status post CABG in 2012. He is medically managed for hypertension with an ARB. He is on a statin for hypercholesterolemia, however he does not tolerate high-dose therapy. He is a non-smoker. He has a history of prostate cancer   CURRENT MEDICATIONS:    Current Outpatient Medications  Medication Sig Dispense Refill   ACETAMINOPHEN  PO Take 650 mg by mouth daily as needed for moderate pain (pain score 4-6) or headache. Take 2  Daily as needed     aspirin  EC 81 MG tablet Take 81 mg by mouth daily. Swallow whole.     Calcium  Carb-Cholecalciferol (CALCIUM -VITAMIN D PO) Take 1 tablet by mouth every Monday, Wednesday, and Friday.     Coenzyme Q10 100 MG capsule Take 100 mg by mouth daily at 12 noon.     diazepam  (VALIUM ) 2 MG tablet Take 1 mg by mouth daily as needed for anxiety.     diphenhydrAMINE  (BENADRYL ) 25 MG tablet Take 25 mg by mouth daily as needed for allergies.      docusate sodium  (COLACE) 100 MG capsule Take 100 mg by mouth daily.     doxycycline (VIBRAMYCIN) 100 MG capsule Take 100 mg by mouth daily as needed (rosacea).      famotidine  (PEPCID ) 20 MG tablet Take 20 mg by mouth every other day.     ipratropium (ATROVENT) 0.06 % nasal spray Place 2 sprays into both nostrils 3 (three) times  daily as needed.     losartan  (COZAAR ) 50 MG tablet Take 1.5 tablets (75 mg total) by mouth daily. 45 tablet 1   metoprolol  tartrate (LOPRESSOR ) 25 MG tablet TAKE 1 TABLET TWICE A DAY (Patient taking differently: Take 25 mg by mouth See admin instructions. 1 tablet in the morning and 0.5 tablet at night around 10pm) 180 tablet 1   Nutritional Supplements (COLD AND FLU PO) Take 15 mLs by mouth at bedtime.     oxyCODONE -acetaminophen  (PERCOCET) 5-325 MG tablet Take 1 tablet by mouth every 6 (six) hours as needed for severe pain (pain score 7-10). 12 tablet 0   pantoprazole  (PROTONIX ) 40 MG tablet Take 40 mg by mouth every other day.     Pediatric Multiple Vitamins (MULTIVITAMIN CHILDRENS) CHEW Chew 1 tablet by mouth daily at 12 noon.     Polyethyl Glycol-Propyl Glycol (LUBRICATING EYE DROPS OP) Place 1 drop into both eyes daily as needed (dry eyes).     pseudoephedrine (SUDAFED) 30 MG tablet Take 30 mg by mouth every 4 (four) hours as needed for congestion.     rosuvastatin  (CRESTOR ) 10 MG tablet Take 10 mg by mouth at bedtime.     sildenafil (REVATIO) 20 MG tablet Take 20 mg by mouth daily as needed.      No current facility-administered medications for this visit.  REVIEW OF SYSTEMS:   [X]  denotes positive finding, [ ]  denotes negative finding Cardiac  Comments:  Chest pain or chest pressure:    Shortness of breath upon exertion:    Short of breath when lying flat:    Irregular heart rhythm:    Constitutional    Fever or chills:      PHYSICAL EXAM:   There were no vitals filed for this visit.  GENERAL: The patient is a well-nourished male, in no acute distress. The vital signs are documented above. CARDIOVASCULAR: There is a regular rate and rhythm. PULMONARY: Non-labored respirations Incision healing nicely  STUDIES:   CTA: 1. Positive High-grade ICA origin/bulb stenoses in the neck with BILATERAL Radiographic-STRING-Signs. Both ICAs patent but with asymmetric diminished  LICA distal caliber and enhancement. And evidence of left > right ICA siphon atherosclerosis also. 2. Advanced atherosclerosis also of both Vertebral Arteries which remain patent. Severe stenoses of the dominant left vertebral artery origin, and the nondominant distal right vertebral V4. Moderate additional distal left artery V4 segment.   MEDICAL ISSUES:   Carotid stenosis: The patient has successfully undergone left carotid endarterectomy.  He still has a high-grade stenosis on the right which is also asymptomatic.  Prior to carotid arterectomy I will send him to ENT for vocal cord evaluation.  Assuming this is unremarkable, I have him scheduled for right carotid endarterectomy on Wednesday, January 7.  He did have a reaction to vancomycin  and also has a cephalosporin allergy, and so we will need to discuss perioperative antibiotics  Malvina New, IV, MD, FACS [facility] [phone] Pager [phone]

## 2023-12-17 DIAGNOSIS — M8580 Other specified disorders of bone density and structure, unspecified site: Secondary | ICD-10-CM | POA: Diagnosis not present

## 2023-12-20 ENCOUNTER — Ambulatory Visit: Attending: Cardiology | Admitting: Cardiology

## 2023-12-20 ENCOUNTER — Encounter: Payer: Self-pay | Admitting: Cardiology

## 2023-12-20 VITALS — BP 136/74 | HR 74 | Ht 70.0 in | Wt 178.0 lb

## 2023-12-20 DIAGNOSIS — Z951 Presence of aortocoronary bypass graft: Secondary | ICD-10-CM | POA: Diagnosis present

## 2023-12-20 DIAGNOSIS — I2510 Atherosclerotic heart disease of native coronary artery without angina pectoris: Secondary | ICD-10-CM | POA: Insufficient documentation

## 2023-12-20 DIAGNOSIS — Z01810 Encounter for preprocedural cardiovascular examination: Secondary | ICD-10-CM | POA: Diagnosis present

## 2023-12-20 DIAGNOSIS — I6522 Occlusion and stenosis of left carotid artery: Secondary | ICD-10-CM | POA: Diagnosis present

## 2023-12-20 DIAGNOSIS — I10 Essential (primary) hypertension: Secondary | ICD-10-CM | POA: Diagnosis present

## 2023-12-20 DIAGNOSIS — E785 Hyperlipidemia, unspecified: Secondary | ICD-10-CM | POA: Insufficient documentation

## 2023-12-20 MED ORDER — LOSARTAN POTASSIUM 50 MG PO TABS: 50.0000 mg | ORAL_TABLET | Freq: Two times a day (BID) | ORAL | 3 refills | Status: DC

## 2023-12-20 NOTE — Patient Instructions (Signed)
 Medication Instructions:    CHANGE TO  LOSARTAN   50 MG   TWICE A DAY - TAKE WITH THE METOPROLOL  25 MG TWICE A DAY   *If you need a refill on your cardiac medications before your next appointment, please call your pharmacy*   Lab Work: NOT NEEDED    Testing/Procedures: NOT NEEDED   Follow-Up: At [facility], you and your health needs are our priority.  As part of our continuing mission to provide you with exceptional heart care, we have created designated Provider Care Teams.  These Care Teams include your primary Cardiologist (physician) and Advanced Practice Providers (APPs -  Physician Assistants and Nurse Practitioners) who all work together to provide you with the care you need, when you need it.     Your next appointment:   5 TO 6  month(s)  The format for your next appointment:   In Person  Provider:   Alm Clay, MD   Other Instructions

## 2023-12-20 NOTE — Progress Notes (Signed)
 Cardiology Office Note:  .   Date:  12/26/2023  ID:  [name], DOB 1945/12/26, MRN [id number] PCP: Clarice Nottingham, MD  [facility] Providers Cardiologist:  Alm Clay, MD     Chief Complaint  Patient presents with   Follow-up    Routine follow-up for blood pressure and lipids   Hospitalization Follow-up    Post carotid endarterectomy   Coronary Artery Disease    No angina    Patient Profile: .     [name] is a robust, healthy-appearing 78 y.o. male with a PMH notable for CAD-CABG, HTN and HLD who presents here for 1 month and post Left CEA follow-up at the request of Clarice Nottingham, MD.     [name] was just seen by Reche Finder, NP on 11/15/2023 for blood pressure management and preop assessment: Felt to be at least 7.3 METS by DASI score.  Over estimation of RCSI.[name]  They discussed Nexletol versus PCSK9 inhibitor and inclisiran.  11/20/2023-left carotid endarterectomy with Dr. Malvina New.  Subjective  Discussed the use of AI scribe software for clinical note transcription with the patient, who gave verbal consent to proceed.  History of Present Illness [name] is a 78 year old male with coronary artery disease who presents for follow-up regarding his cardiovascular health and recent surgical history.  He has undergone three surgeries since August 1st, including shoulder surgery, carpal tunnel surgery, and a left carotid endarterectomy. He is scheduled for a right carotid endarterectomy on January 7th. Recovery has been slow, impacting his ability to perform daily activities such as driving and working in his garage.  He reports having a stress test in November 2022. No chest pain, pressure, tightness, heart racing, or shortness of breath since his bypass surgery. He was sore after the bypass surgery but is no longer experiencing soreness.  He describes a reaction to an antibiotic administered post-surgery, which caused shaking and redness,  identified as a 'red skin' reaction. He is allergic to cephalosporins and experienced a reaction to vancomycin .  He has been experiencing numbness in his thumb and forefinger following carpal tunnel surgery. He wears a brace on his left hand at night to manage symptoms.  His blood pressure medication was adjusted due to low readings and symptoms of 'a dark image around my eyes at night'. He has been taking losartan  and metoprolol  for blood pressure control. He monitors his blood pressure regularly, noting fluctuations throughout the day.  He has a history of coronary artery disease and underwent bypass surgery previously. He has not had any heart-related issues since the surgery and continues to engage in activities like golf and yard work, although these have been limited since his recent surgeries.   Cardiovascular ROS: no chest pain or dyspnea on exertion negative for - edema, irregular heartbeat, murmur, orthopnea, palpitations, paroxysmal nocturnal dyspnea, rapid heart rate, shortness of breath, or syncope/near syncope or TIA/amaurosis fugax, claudication.  ROS:  Review of Systems - Negative except symptoms noted above.    Objective   Medications: Aspirin  81 mg daily Losartan  75 mg daily, Lopressor  25 mg twice daily Rosuvastatin  10 mg daily; CoQ 1000 mg daily Sildenafil 20 mg daily Pantoprazole  40 mg every other day. Colace 100 mg daily PRNs: Tylenol , cold and flu, Benadryl , Percocet, Sudafed, Atrovent nasal Valium ; doxycycline for rosacea  Previous lipid lowering agents Lipitor - elevated liver enzymes Zetia  - did not tolerate, GI upset  Studies Reviewed: .  Labs from 10/16/2023: TC 162, TG 165, HDL 44, LDL 89.  ALT/AST 19/23. 11/13-14/25: TC 135, TG 68, HDL 48, LDL 73.  BUN 15, Cr 1.21, GLU 104, Hgb 14.2, K+ 3.7 Results Radiology Carotid ultrasound: Bilateral carotid artery stenosis, greater on the left than the right. Neck CT: Bilateral carotid artery stenosis,  greater on the left than the right; findings utilized for operative planning.  Diagnostic Lexiscan  Myoview  (11/2020): Low Risk, No Ischemia, No Infarction Normal LVEF 67%.; septal bounce-paradoxical septal motion due to CABG; ST depressions during exercise +> converted to Lexiscan   Risk Assessment/Calculations:               [name]'s perioperative risk of a major cardiac event is 0.9% according to the Revised Cardiac Risk Index (RCRI).  Therefore, he is at low risk for perioperative complications.   His functional capacity is excellent at 8.97 METs according to the Duke Activity Status Index (DASI). -> He does have coronary disease but he has been revascularized with a nonischemic Myoview  and not having symptoms.  Therefore he does not get points for CAD. Recommendations: According to ACC/AHA guidelines, no further cardiovascular testing needed.  The patient may proceed to surgery at acceptable risk.   Antiplatelet and/or Anticoagulation Recommendations: Aspirin  can be held for 5-7 days prior to his surgery.  Please resume Aspirin  post operatively when it is felt to be safe from a bleeding standpoint.     Physical Exam:   VS:  BP 136/74   Pulse 74   Ht 5' 10 (1.778 m)   Wt 178 lb (80.7 kg)   SpO2 97%   BMI 25.54 kg/m    Wt Readings from Last 3 Encounters:  12/20/23 178 lb (80.7 kg)  12/16/23 178 lb (80.7 kg)  11/20/23 173 lb (78.5 kg)      GEN: Well nourished, well groomed; in no acute distress; healthy-appearing NECK: No JVD; No carotid bruits CARDIAC: Normal S1, S2; RRR, no murmurs, rubs, gallops RESPIRATORY:  Clear to auscultation without rales, wheezing or rhonchi ; nonlabored, good air movement. ABDOMEN: Soft, non-tender, non-distended EXTREMITIES:  No edema; No deformity      ASSESSMENT AND PLAN: .    Problem List Items Addressed This Visit       Cardiology Problems   CAD in native artery; Left Main and LAD and occluded RPDA; referred for CABG - Primary  (Chronic)   Coronary artery disease with CABG. No recent cardiac symptoms. Previous stress test in November 2022 showed no ischemia or infarction. Heart function stable post-CABG. - Continue current cardiac medications including aspirin  and rosuvastatin  Aspirin  81 mg daily Losartan  75 mg daily, Lopressor  25 mg twice daily Rosuvastatin  10 mg daily; CoQ 1000 mg daily - Schedule follow-up stress test in 2027.      Relevant Medications   losartan  (COZAAR ) 50 MG tablet   Carotid stenosis, asymptomatic, left   Bilateral carotid artery stenosis, post left endarterectomy Status post left carotid endarterectomy with planned right carotid endarterectomy on January 7th. Previous imaging confirmed significant stenosis. No stent placement due to severity. Concerns about potential vocal cord nerve involvement. - Proceed with right carotid endarterectomy on January 7th. - Consult ENT on December 31st to evaluate vocal cords.      Relevant Medications   losartan  (COZAAR ) 50 MG tablet   Hypertension - labile (Chronic)   Managed with losartan  and metoprolol . Recent adjustment to losartan  dosage due to hypotensive episodes. Blood pressure generally within acceptable range, with occasional elevations. - Adjusted losartan   to 50 mg twice daily with metoprolol  25 mg twice daily doses. - Monitor blood pressure, especially pre-surgery.       Relevant Medications   losartan  (COZAAR ) 50 MG tablet     Other   Dyslipidemia (high LDL; low HDL) (Chronic)   Notable improvement in lipids with LDL dropping from 89-73 with current meds.  He is really leery of taking additional medications.  I think he is doing okay pretty close to goal.  We could consider adding bempedoic acid since he was intolerant of Zetia .  I do not think he would tolerate a higher dose of statin.   For now continue current dose of 10 mg rosuvastatin .  If LDL goes back up again would potentially consider bempedoic acid otherwise stable with  current Rx.      Preop cardiovascular exam   Preop for Carotid Endarterectomy  [name]'s perioperative risk of a major cardiac event is 0.9% according to the Revised Cardiac Risk Index (RCRI).  Therefore, he is at low risk for perioperative complications.   His functional capacity is excellent at 8.97 METs according to the Duke Activity Status Index (DASI). -> He does have coronary disease but he has been revascularized with a nonischemic Myoview  and not having symptoms.  Therefore he does not get points for CAD. Recommendations: According to ACC/AHA guidelines, no further cardiovascular testing needed.  The patient may proceed to surgery at acceptable risk.   Antiplatelet and/or Anticoagulation Recommendations: Aspirin  can be held for 5-7 days prior to his surgery.  Please resume Aspirin  post operatively when it is felt to be safe from a bleeding standpoint.       S/P CABG x 4 (Chronic)   Never really had anginal symptoms.  Had a treadmill stress test that was abnormal leading to cardiac catheterization showing multivessel disease.  He is very active and not having any anginal symptoms.  Most recent stress test was November 2022.  Would probably check more in 2027.               Follow-Up: Return in about 6 months (around 06/19/2024) for 6 month follow-up with me, [employer].  I spent 57 minutes in the care of [name] today including reviewing labs (1 minute), reviewing outside labs from PCP via [facility] (1 minute), reviewing studies (reviewed his most recent echo and Myoview  results-5 minutes), face to face time discussing treatment options (28 minutes), reviewing records from notes from Katlyn West, NP.  My previous note and hospitalization for CEA (9 minutes), 13 minutes, and documenting in the encounter.      Signed, Alm MICAEL Clay, MD, MS Alm Clay, M.D., M.S. Interventional Cardiologist  [facility] Pager # [phone]

## 2023-12-21 ENCOUNTER — Other Ambulatory Visit (HOSPITAL_BASED_OUTPATIENT_CLINIC_OR_DEPARTMENT_OTHER): Payer: Self-pay | Admitting: Internal Medicine

## 2023-12-21 DIAGNOSIS — M810 Age-related osteoporosis without current pathological fracture: Secondary | ICD-10-CM

## 2023-12-26 ENCOUNTER — Ambulatory Visit: Admitting: General Practice

## 2023-12-26 ENCOUNTER — Encounter: Payer: Self-pay | Admitting: Cardiology

## 2023-12-26 NOTE — Assessment & Plan Note (Signed)
 Bilateral carotid artery stenosis, post left endarterectomy Status post left carotid endarterectomy with planned right carotid endarterectomy on January 7th. Previous imaging confirmed significant stenosis. No stent placement due to severity. Concerns about potential vocal cord nerve involvement. - Proceed with right carotid endarterectomy on January 7th. - Consult ENT on December 31st to evaluate vocal cords.

## 2023-12-26 NOTE — Assessment & Plan Note (Signed)
 Managed with losartan  and metoprolol . Recent adjustment to losartan  dosage due to hypotensive episodes. Blood pressure generally within acceptable range, with occasional elevations. - Adjusted losartan  to 50 mg twice daily with metoprolol  25 mg twice daily doses. - Monitor blood pressure, especially pre-surgery.

## 2023-12-26 NOTE — Assessment & Plan Note (Signed)
 Coronary artery disease with CABG. No recent cardiac symptoms. Previous stress test in November 2022 showed no ischemia or infarction. Heart function stable post-CABG. - Continue current cardiac medications including aspirin  and rosuvastatin  Aspirin  81 mg daily Losartan  75 mg daily, Lopressor  25 mg twice daily Rosuvastatin  10 mg daily; CoQ 1000 mg daily - Schedule follow-up stress test in 2027.

## 2023-12-26 NOTE — Assessment & Plan Note (Signed)
 Never really had anginal symptoms.  Had a treadmill stress test that was abnormal leading to cardiac catheterization showing multivessel disease.  He is very active and not having any anginal symptoms.  Most recent stress test was November 2022.  Would probably check more in 2027.

## 2023-12-26 NOTE — Assessment & Plan Note (Signed)
 Preop for Carotid Endarterectomy  [name]'s perioperative risk of a major cardiac event is 0.9% according to the Revised Cardiac Risk Index (RCRI).  Therefore, he is at low risk for perioperative complications.   His functional capacity is excellent at 8.97 METs according to the Duke Activity Status Index (DASI). -> He does have coronary disease but he has been revascularized with a nonischemic Myoview  and not having symptoms.  Therefore he does not get points for CAD. Recommendations: According to ACC/AHA guidelines, no further cardiovascular testing needed.  The patient may proceed to surgery at acceptable risk.   Antiplatelet and/or Anticoagulation Recommendations: Aspirin  can be held for 5-7 days prior to his surgery.  Please resume Aspirin  post operatively when it is felt to be safe from a bleeding standpoint.

## 2023-12-26 NOTE — Assessment & Plan Note (Signed)
 Notable improvement in lipids with LDL dropping from 89-73 with current meds.  He is really leery of taking additional medications.  I think he is doing okay pretty close to goal.  We could consider adding bempedoic acid since he was intolerant of Zetia .  I do not think he would tolerate a higher dose of statin.   For now continue current dose of 10 mg rosuvastatin .  If LDL goes back up again would potentially consider bempedoic acid otherwise stable with current Rx.

## 2023-12-28 ENCOUNTER — Other Ambulatory Visit (HOSPITAL_COMMUNITY): Payer: Self-pay

## 2024-01-07 ENCOUNTER — Other Ambulatory Visit (HOSPITAL_COMMUNITY)

## 2024-01-07 NOTE — Pre-Procedure Instructions (Signed)
 Surgical Instructions   Your procedure is scheduled on January 15, 2024. Report to [facility] Main Entrance A at 10:20 A.M., then check in with the Admitting office. Any questions or running late day of surgery: call [phone]  Questions prior to your surgery date: call [phone], Monday-Friday, 8am-4pm. If you experience any cold or flu symptoms such as cough, fever, chills, shortness of breath, etc. between now and your scheduled surgery, please notify us  at the above number.     Remember:  Do not eat or drink after midnight the night before your surgery    Take these medicines the morning of surgery with A SIP OF WATER: aspirin  EC  metoprolol  tartrate (LOPRESSOR )  pantoprazole  (PROTONIX )    May take these medicines IF NEEDED: ACETAMINOPHEN   diphenhydrAMINE  (BENADRYL )  docusate sodium  (COLACE)  famotidine  (PEPCID )  ipratropium (ATROVENT) nasal spray  Polyethyl Glycol-Propyl Glycol (LUBRICATING EYE DROPS OP) eye drops traMADol  (ULTRAM )    One week prior to surgery, STOP taking any Aleve, Naproxen, Ibuprofen, Motrin, Advil, Goody's, BC's, all herbal medications, fish oil, and non-prescription vitamins.                     Do NOT Smoke (Tobacco/Vaping) for 24 hours prior to your procedure.  If you use a CPAP at night, you may bring your mask/headgear for your overnight stay.   You will be asked to remove any contacts, glasses, piercing's, hearing aid's, dentures/partials prior to surgery. Please bring cases for these items if needed.    Patients discharged the day of surgery will not be allowed to drive home, and someone needs to stay with them for 24 hours.  SURGICAL WAITING ROOM VISITATION Patients may have no more than 2 support people in the waiting area - these visitors may rotate.   Pre-op nurse will coordinate an appropriate time for 1 ADULT support person, who may not rotate, to accompany patient in pre-op.  Children under the age of 30 must have an adult  with them who is not the patient and must remain in the main waiting area with an adult.  If the patient needs to stay at the hospital during part of their recovery, the visitor guidelines for inpatient rooms apply.  Please refer to the [facility] website for the visitor guidelines for any additional information.   If you received a COVID test during your pre-op visit  it is requested that you wear a mask when out in public, stay away from anyone that may not be feeling well and notify your surgeon if you develop symptoms. If you have been in contact with anyone that has tested positive in the last 10 days please notify you surgeon.      Pre-operative CHG Bathing Instructions   You can play a key role in reducing the risk of infection after surgery. Your skin needs to be as free of germs as possible. You can reduce the number of germs on your skin by washing with CHG (chlorhexidine  gluconate) soap before surgery. CHG is an antiseptic soap that kills germs and continues to kill germs even after washing.   DO NOT use if you have an allergy to chlorhexidine /CHG or antibacterial soaps. If your skin becomes reddened or irritated, stop using the CHG and notify one of our RNs at [phone].              TAKE A SHOWER THE NIGHT BEFORE SURGERY   Please keep in mind the following:  DO NOT shave,  including legs and underarms, 48 hours prior to surgery.   You may shave your face before/day of surgery.  Place clean sheets on your bed the night before surgery Use a clean washcloth (not used since being washed) for shower. DO NOT sleep with pet's night before surgery.  CHG Shower Instructions:  Wash your face and private area with normal soap. If you choose to wash your hair, wash first with your normal shampoo.  After you use shampoo/soap, rinse your hair and body thoroughly to remove shampoo/soap residue.  Turn the water OFF and apply half the bottle of CHG soap to a CLEAN washcloth.  Apply CHG  soap ONLY FROM YOUR NECK DOWN TO YOUR TOES (washing for 3-5 minutes)  DO NOT use CHG soap on face, private areas, open wounds, or sores.  Pay special attention to the area where your surgery is being performed.  If you are having back surgery, having someone wash your back for you may be helpful. Wait 2 minutes after CHG soap is applied, then you may rinse off the CHG soap.  Pat dry with a clean towel  Put on clean pajamas    Additional instructions for the day of surgery: If you choose, you may shower the morning of surgery with an antibacterial soap.  DO NOT APPLY any lotions, deodorants, cologne, or perfumes.   Do not wear jewelry or makeup Do not wear nail polish, gel polish, artificial nails, or any other type of covering on natural nails (fingers and toes) Do not bring valuables to the hospital. [facility] is not responsible for valuables/personal belongings. Put on clean/comfortable clothes.  Please brush your teeth.  Ask your nurse before applying any prescription medications to the skin.

## 2024-01-08 ENCOUNTER — Encounter (HOSPITAL_COMMUNITY)
Admission: RE | Admit: 2024-01-08 | Discharge: 2024-01-08 | Disposition: A | Discharge status: Home / Self Care | Source: Ambulatory Visit | Attending: Surgery | Admitting: Surgery

## 2024-01-08 ENCOUNTER — Other Ambulatory Visit: Payer: Self-pay

## 2024-01-08 ENCOUNTER — Ambulatory Visit (INDEPENDENT_AMBULATORY_CARE_PROVIDER_SITE_OTHER): Admitting: Otolaryngology

## 2024-01-08 ENCOUNTER — Encounter (HOSPITAL_COMMUNITY): Payer: Self-pay

## 2024-01-08 ENCOUNTER — Encounter (INDEPENDENT_AMBULATORY_CARE_PROVIDER_SITE_OTHER): Payer: Self-pay | Admitting: Otolaryngology

## 2024-01-08 VITALS — BP 171/86 | HR 67 | Temp 97.9°F | Resp 18 | Ht 70.0 in | Wt 178.1 lb

## 2024-01-08 VITALS — BP 112/69 | HR 63 | Ht 70.0 in | Wt 175.0 lb

## 2024-01-08 DIAGNOSIS — Z87891 Personal history of nicotine dependence: Secondary | ICD-10-CM | POA: Insufficient documentation

## 2024-01-08 DIAGNOSIS — K219 Gastro-esophageal reflux disease without esophagitis: Secondary | ICD-10-CM | POA: Insufficient documentation

## 2024-01-08 DIAGNOSIS — Z01818 Encounter for other preprocedural examination: Secondary | ICD-10-CM

## 2024-01-08 DIAGNOSIS — J383 Other diseases of vocal cords: Secondary | ICD-10-CM | POA: Diagnosis not present

## 2024-01-08 DIAGNOSIS — I2510 Atherosclerotic heart disease of native coronary artery without angina pectoris: Secondary | ICD-10-CM | POA: Insufficient documentation

## 2024-01-08 DIAGNOSIS — Z01812 Encounter for preprocedural laboratory examination: Secondary | ICD-10-CM | POA: Insufficient documentation

## 2024-01-08 DIAGNOSIS — E785 Hyperlipidemia, unspecified: Secondary | ICD-10-CM | POA: Insufficient documentation

## 2024-01-08 DIAGNOSIS — Z951 Presence of aortocoronary bypass graft: Secondary | ICD-10-CM | POA: Diagnosis not present

## 2024-01-08 DIAGNOSIS — I6523 Occlusion and stenosis of bilateral carotid arteries: Secondary | ICD-10-CM | POA: Diagnosis not present

## 2024-01-08 DIAGNOSIS — J309 Allergic rhinitis, unspecified: Secondary | ICD-10-CM

## 2024-01-08 DIAGNOSIS — J3089 Other allergic rhinitis: Secondary | ICD-10-CM

## 2024-01-08 DIAGNOSIS — I10 Essential (primary) hypertension: Secondary | ICD-10-CM | POA: Insufficient documentation

## 2024-01-08 HISTORY — DX: Peripheral vascular disease, unspecified: I73.9

## 2024-01-08 LAB — URINALYSIS, ROUTINE W REFLEX MICROSCOPIC
Bacteria, UA: NONE SEEN
Bilirubin Urine: NEGATIVE
Glucose, UA: NEGATIVE mg/dL
Ketones, ur: NEGATIVE mg/dL
Leukocytes,Ua: NEGATIVE
Nitrite: NEGATIVE
Protein, ur: NEGATIVE mg/dL
Specific Gravity, Urine: 1.006 (ref 1.005–1.030)
pH: 7 (ref 5.0–8.0)

## 2024-01-08 LAB — COMPREHENSIVE METABOLIC PANEL WITH GFR
ALT: 40 U/L (ref 0–44)
AST: 42 U/L — ABNORMAL HIGH (ref 15–41)
Albumin: 4.4 g/dL (ref 3.5–5.0)
Alkaline Phosphatase: 89 U/L (ref 38–126)
Anion gap: 8 (ref 5–15)
BUN: 19 mg/dL (ref 8–23)
CO2: 30 mmol/L (ref 22–32)
Calcium: 9.3 mg/dL (ref 8.9–10.3)
Chloride: 97 mmol/L — ABNORMAL LOW (ref 98–111)
Creatinine, Ser: 1.17 mg/dL (ref 0.61–1.24)
GFR, Estimated: >60 mL/min
Glucose, Bld: 114 mg/dL — ABNORMAL HIGH (ref 70–99)
Potassium: 4.6 mmol/L (ref 3.5–5.1)
Sodium: 136 mmol/L (ref 135–145)
Total Bilirubin: 0.6 mg/dL (ref 0.0–1.2)
Total Protein: 6.7 g/dL (ref 6.5–8.1)

## 2024-01-08 LAB — TYPE AND SCREEN
ABO/RH(D): O POS
Antibody Screen: NEGATIVE

## 2024-01-08 LAB — APTT: aPTT: 29 s (ref 24–36)

## 2024-01-08 LAB — CBC
HCT: 45.6 % (ref 39.0–52.0)
Hemoglobin: 15.3 g/dL (ref 13.0–17.0)
MCH: 29.7 pg (ref 26.0–34.0)
MCHC: 33.6 g/dL (ref 30.0–36.0)
MCV: 88.4 fL (ref 80.0–100.0)
Platelets: 208 K/uL (ref 150–400)
RBC: 5.16 MIL/uL (ref 4.22–5.81)
RDW: 13.2 % (ref 11.5–15.5)
WBC: 5.3 K/uL (ref 4.0–10.5)
nRBC: 0 % (ref 0.0–0.2)

## 2024-01-08 LAB — PROTIME-INR
INR: 1 (ref 0.8–1.2)
Prothrombin Time: 13.8 s (ref 11.4–15.2)

## 2024-01-08 LAB — SURGICAL PCR SCREEN
MRSA, PCR: NEGATIVE
Staphylococcus aureus: NEGATIVE

## 2024-01-08 NOTE — Progress Notes (Signed)
 Reason for Consult: Vocal cord check Referring Physician: Dr. Serene Elspeth DELENA [name] is an 78 y.o. male.  HPI: Here for evaluation of his vocal cord as he had a carotid endarterectomy on the left side previously and now in 2 weeks is supposed to have a right side.  The patient has not had any hoarseness after surgery or now.  No dysphagia or dyne aphasia.  He does have nasal congestion and runny nose.  He has tried Atrovent without relief but he only uses it when he has a runny nose.  He has also tried nasal steroid sprays but again only when he drains.  No purulent drainage.  No facial pressure or pain.  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   Cancer (HCC)    skin cancers-squamous cell / facial    COVID    x2   Dyslipidemia - low HDL    Lipids monitored by Dr. Clarice.   GERD (gastroesophageal reflux disease)    History of multiple allergies    History of vertigo    Hypertension    Labile   Low testosterone     Recently stopped replacement therapy.   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   Urinary incontinence, urge    Wears glasses     Past Surgical History:  Procedure Laterality Date   arthroscopy of knees bilat      CARDIAC CATHETERIZATION  08/18/2010   Terminal left main 70%, ostial LAD 90%, sequential 60-70%  lesion in the proximal LAD, mild circumflex lesions, but large OM1 and OM2. RCA had moderate irregularities and a subtotal proximal PDA   CARDIOVASCULAR STRESS TEST  02/06/2011   EKG negative for ischemia. No significant wall motion abnormalities noted.   CATARACT EXTRACTION, BILATERAL     CHOLECYSTECTOMY N/A 07/29/2015   Procedure: LAPAROSCOPIC CHOLECYSTECTOMY WITH INTRAOPERATIVE CHOLANGIOGRAM;  Surgeon: Krystal Russell, MD;  Location: [facility] ORS;  Service: General;  Laterality: N/A;   COLONOSCOPY     CORONARY ARTERY BYPASS GRAFT  08/29/2010   x4. LIMA to LAD, SVG to diag, SVG to OM, SVG to PDA. Rt thigh vein harvest   ENDARTERECTOMY Left 11/20/2023   Procedure: ENDARTERECTOMY, CAROTID with BIOPATCH;  Surgeon: Serene Gaile ORN, MD;  Location: [facility] OR;  Service: Vascular;  Laterality: Left;   HAND SURGERY Right 10/2023   carpal tunnel and surgery for finger numbness   LYMPHADENECTOMY Bilateral 10/13/2018   Procedure: LYMPHADENECTOMY, PELVIC;  Surgeon: Renda Glance, MD;  Location: [facility];  Service: Urology;  Laterality: Bilateral;   MOHS SURGERY     Face and Neck Left eyelid   NM MYOVIEW  LTD  11/2020   No Ischemia or Infarction. EF 65-70%. LOW RISK. NORMAL  PRE CABG DOPPLER  08/25/2010   No significant extracranial carotid artery stenosis. ECA stenosis noted bilaterally - ICA/CCA ratio id 0.9-rt and 0.74-lft   ROBOT ASSISTED LAPAROSCOPIC RADICAL PROSTATECTOMY N/A 10/13/2018   Procedure: XI ROBOTIC ASSISTED LAPAROSCOPIC RADICAL PROSTATECTOMY LEVEL 2;  Surgeon: Renda Glance, MD;  Location: [facility];  Service: Urology;  Laterality: N/A;   SHOULDER ARTHROSCOPY Left 08/09/2023   rotator cuff repair- D Beane   SURGERY SCROTAL / TESTICULAR     Benign nodule   TONSILLECTOMY     TRANSTHORACIC ECHOCARDIOGRAM  07/27/2010   EF >55%, mild concentric LVH, stage 1 diastolic dysfunction    Family History  Problem Relation Age of Onset   Cancer Father        RENAL CA -DIED AGE 7O   Heart failure  Brother        ? heart dz.   Heart disease Brother    Prostate cancer Brother    Heart disease Maternal Grandfather    Colon cancer Maternal Aunt    Colon cancer Maternal Aunt    Breast cancer Neg Hx    Pancreatic cancer Neg Hx     Social History:  reports that he has never smoked. He quit smokeless tobacco use about 19 years ago.  His smokeless tobacco use included chew. He reports that he does not drink alcohol and does not use drugs.  Allergies: Allergies[1]   Results for orders placed or performed during the hospital encounter of 01/08/24 (from the past 48 hours)  CBC     Status: None   Collection Time: 01/08/24  8:51 AM  Result Value Ref Range   WBC 5.3 4.0 - 10.5 K/uL   RBC 5.16 4.22 - 5.81 MIL/uL   Hemoglobin 15.3 13.0 - 17.0 g/dL   HCT 54.3 60.9 - 47.9 %   MCV 88.4 80.0 - 100.0 fL   MCH 29.7 26.0 - 34.0 pg   MCHC 33.6 30.0 - 36.0 g/dL   RDW 86.7 88.4 - 84.4 %   Platelets 208 150 - 400 K/uL   nRBC 0.0 0.0 - 0.2 %    Comment: Performed at [facility], [address] [place], [place] [address]  Comprehensive metabolic panel     Status: Abnormal   Collection Time: 01/08/24  8:51 AM  Result Value Ref Range   Sodium 136 135 - 145 mmol/L   Potassium 4.6 3.5 - 5.1 mmol/L   Chloride 97 (L) 98 - 111 mmol/L   CO2 30 22 - 32 mmol/L   Glucose, Bld 114 (H) 70 - 99 mg/dL    Comment: Glucose reference range applies only to samples taken after fasting for at least 8 hours.   BUN 19 8 - 23 mg/dL   Creatinine, Ser 8.82 0.61 - 1.24 mg/dL   Calcium  9.3 8.9 - 10.3 mg/dL   Total Protein 6.7 6.5 - 8.1 g/dL   Albumin 4.4 3.5 - 5.0 g/dL   AST 42 (H) 15 - 41 U/L   ALT 40 0 - 44 U/L   Alkaline Phosphatase 89 38 - 126 U/L   Total Bilirubin 0.6 0.0 - 1.2 mg/dL   GFR, Estimated >39 >39 mL/min    Comment: (NOTE) Calculated using the CKD-EPI Creatinine Equation (2021)    Anion gap 8 5 - 15    Comment: Performed at [facility], [address], [place], [place] [address]   Protime-INR     Status: None   Collection Time: 01/08/24  8:51  AM  Result Value Ref Range   Prothrombin Time 13.8 11.4 - 15.2 seconds   INR 1.0 0.8 - 1.2    Comment: (NOTE) INR goal varies based on device and disease states. Performed at [facility], [address], [place], [place] [address]   APTT     Status: None   Collection Time: 01/08/24  8:51 AM  Result Value Ref Range   aPTT 29 24 - 36 seconds    Comment: Performed at [facility], [address], [place], [place] [address]  Type and screen     Status: None   Collection Time: 01/08/24  9:06 AM  Result Value Ref Range   ABO/RH(D) O POS    Antibody Screen NEG    Sample Expiration 01/22/2024,2359    Extend sample reason      NO TRANSFUSIONS OR PREGNANCY IN THE PAST 3 MONTHS Performed at [facility], [address] [place], [place] [address]   Urinalysis, Routine w reflex microscopic -Urine, Clean Catch     Status: Abnormal   Collection Time: 01/08/24  9:13 AM  Result Value Ref Range   Color, Urine YELLOW YELLOW   APPearance CLEAR CLEAR   Specific Gravity, Urine 1.006 1.005 - 1.030   pH 7.0 5.0 - 8.0   Glucose, UA NEGATIVE NEGATIVE mg/dL   Hgb urine dipstick SMALL (A) NEGATIVE   Bilirubin Urine NEGATIVE NEGATIVE   Ketones, ur NEGATIVE NEGATIVE mg/dL   Protein, ur NEGATIVE NEGATIVE mg/dL   Nitrite NEGATIVE NEGATIVE   Leukocytes,Ua NEGATIVE NEGATIVE   RBC / HPF 0-5 0 - 5 RBC/hpf   WBC, UA 0-5 0 - 5 WBC/hpf   Bacteria, UA NONE SEEN NONE SEEN   Squamous Epithelial / HPF 0-5 0 - 5 /HPF    Comment: Performed at [facility], [address] [place], [place] [address]    No results found.  ROS Blood pressure 112/69, pulse 63, height 5' 10 (1.778 m), weight 175 lb (79.4 kg), SpO2 97%. Physical Exam Constitutional:      Appearance: Normal appearance.  HENT:     Head: Normocephalic and atraumatic.     Right Ear: Tympanic membrane is without lesions and middle ear aerated, ear canal and  external ear normal.     Left Ear: Tympanic membrane is without lesions and middle ear aerated, ear canal and external ear normal.     Nose: Nose witht deviation of septum to the left  Turbinates with moderate hypertrophy, No significant swelling or masses.     Oral cavity/oropharynx: Mucous membranes are moist. No lesions or masses    Larynx: normal voice. Mirror attempted without success    Eyes:     Extraocular Movements: Extraocular movements intact.     Conjunctiva/sclera: Conjunctivae normal.     Pupils: Pupils are equal, round, and reactive to light.  Cardiovascular:     Rate and Rhythm: Normal rate.  Pulmonary:     Effort: Pulmonary effort is normal.  Musculoskeletal:     Cervical back: Normal range of motion and neck supple. No rigidity.  Lymphadenopathy:     Cervical: No cervical adenopathy or masses.salivary glands without lesions. .     Salivary glands- no mass or swelling Neurological:     Mental Status: He is alert. CN 2-12 intact. No nystagmus  Flexible fibroptic laryngoscopy  Patient was informed of risks, benefits, and options. All questions answered. Consent obtained.   The scope was passed through the  nose and tracked into the nasopharynx. The nasopharynx without lesions or masses. The scope was positioned over the base of tongue and epiglottis. There was no obvious lesions or significant swelling and any of the laryngeal or pharyngeal structures. The vocal cords move well. T there is a slight bowing of both vocal cords.  He subglottis has minimal visualization but no lesions identified. The scope was removed without difficulty and patient tolerated well.      Assessment/Plan: Rhinitis-he will try Nasacort on a routine basis 2 sprays each side once a day for at least a month and see how his nose congestion improves using it on a regular basis.  He has only been using it intermittently previously.  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  Norleen Notice 01/08/2024, 12:14 PM        [1]  Allergies Allergen Reactions   Cephalosporins Palpitations   Hydrochlorothiazide -Triamterene Palpitations   Cephalexin Dermatitis   Statins Other (See Comments)     elevated muscle enzymes Lipitor   Vancomycin      Red Man Syndrome and shaking   Vioxx [Rofecoxib] Swelling    Facial swelling   Zetia  [Ezetimibe ]     Increased muscle enzymes; GI upset   Lisinopril Cough   Sulfa Antibiotics Rash

## 2024-01-08 NOTE — Progress Notes (Signed)
 PCP - Dr. Ryan Hives Cardiologist - Dr. Alm Clay - last office visit 12/20/2023  PPM/ICD - Denies Device Orders - n/a Rep Notified - n/a  Chest x-ray - n/a EKG - 11/15/2023 Stress Test - 11/14/2020 ECHO - 07/27/2010 Cardiac Cath - 08/18/2010  Sleep Study - Denies CPAP - n/a  No DM  Last dose of GLP1 agonist- n/a GLP1 instructions: n/a  Blood Thinner Instructions: n/a Aspirin  Instructions: Pt instructed to continue taking his ASA through the morning of surgery  NPO after midnight  COVID TEST- n/a   Anesthesia review: Yes. Cardiac clearance. Pt is pending an ENT consult for build-up feeling in neck (consult is today, 12/31). Pt also endorses raw throat, congestions and occasional cough with white phlegm. He states he has chronic sinus issues and had the same congestion prior to Left Carotid Endarterectomy in November 2025. All discussed with Allison Zelenak, PA-C who will follow-up with ENT note and patient regarding cold-like symptoms.   Patient denies shortness of breath, fever, cough and chest pain at PAT appointment   All instructions explained to the patient, with a verbal understanding of the material. Patient agrees to go over the instructions while at home for a better understanding. Patient also instructed to self quarantine after being tested for COVID-19. The opportunity to ask questions was provided.

## 2024-01-13 NOTE — Progress Notes (Signed)
 Anesthesia Chart Review:   Case: [id number] Date/Time: 01/15/24 1208   Procedure: ENDARTERECTOMY, CAROTID (Right)   Anesthesia type: General   Diagnosis: Bilateral carotid artery stenosis [I65.23]   Pre-op diagnosis: BL CAROTID STENOSIS   [facility]: [facility] 12 / [facility]   Surgeons: Serene Gaile ORN, MD       DISCUSSION:  Patient is a 79 year old male scheduled for the above procedure. He has asymptomatic 80-99% BICA stenosis by 10/24/2023 US . S/p left carotid endarterectomy 11/20/2023.    History includes never smoker (former smokeless tobacco, quit 2007), HTN, CAD (s/p CABG: LIMA-LAD, SVG-DIAG, SVG-OM1, SVG-rPRDA 08/29/2010), HLD, carotid artery stenosis (s/p left carotid endarterectomy 11/20/2023), prostate cancer (s/p robotic assisted laparoscopic radical prostatectomy, pelvic lymphadenopathy 10/13/2018), GERD, cholecystectomy (07/29/2015), skin cancer (SCC, s/p Mohs).    He was last evaluated by cardiologist Dr. Anner on 12/20/2023. Previous visit was on 11/7/205 with APP for preoperative evaluation prior to left CEA. Dr. Anner noted, that Mr. Mcmiller had undergone three surgeries since August 1st, including shoulder surgery, carpal tunnel surgery, and a left carotid endarterectomy. He is scheduled for a right carotid endarterectomy on January 7th. Recovery has been slow, impacting his ability to perform daily activities such as driving and working in his garage. He had some hypotension after left CEA with medication adjustments and was monitoring home readings with noted fluctuations. He denied CV symptoms. Continued medical therapy for CAD recommended. He felt Mr. Woolsey could Proceed with right carotid endarterectomy on January 7th. Preoperative ENT evaluation planned to evaluate vocal cords.  He had ENT evaluation with Roark Rush, MD on 01/08/2024. Of note, at 01/08/2024 PAT visit he had reported issues with chronic sinusitis and noted runny nose and occasional cough that morning, along with  a raw throat. He was asked to notify vascular surgeon if any worsening or progressive symptoms beyond his usual spectrum of rhinitis/sinusitis symptoms. At his ENT visit, he denied hoarseness and dysphagia. A flexible fibroptic laryngoscopy was performed that showed the nasopharynx without lesions or masses, no obvious lesions or significant swelling and any of the laryngeal or pharyngeal structures, goo vocal cord movement, slight bowing of the vocal cords, no lesion noted on visualized subglottis. Dr. Roark prescribed Nasacort 2 sprays each side daily for at least a month to see is nasal congestion would improve (had only been using intermittently). He added, he has no evidence of vocal cord paralysis so he can proceed with the surgical procedure.  I called him on 01/13/2024 to follow-up on runny nose from last week. He reported having sinus issues ever since his 2012 CABG. Although it is not uncommon that he has a runny nose, he felt he could have also had a virus as well since his throat hurt for about a day.  His wife had a viral URI the week before (with negative flu and COVID testing). His primary symptom has been a runny nose (watery, clear). He denied fever or SOB. No weakness. He did have an intermittent dry cough, primarily at night from post-nasal drip, but that has nearly resolved. He had been taking Nyquil 1-2 times per day (mainly at night to help with sleep) and using Nasocort once daily. He normally takes Sudafed, but says it is on hold for now as not to increase his BP. He reportedly contacted Dr. Russ office on 01/13/2024 to follow-up with them as well about his runny nose. He said recommendation was to proceed as planned. He said he was also told to take Nasocort BID  until surgery.   He has asymptomatic, but severe bilateral ICA stenosis (80-99%; string sign on CTA). S/p left CEA 11/20/2023, but now needs right CEA to reduce CVA risk. He reported feeling well now but with fairly  persistent runny nose and is taking Nasocort. He said voice may be slightly hoarse, although I could not really detect over the phone. No coughing or conversational dyspnea noted during our conversation. Given string sign on the right, case may be considered time sensitive. He reported that [facility] staff indicated plan was to proceed as scheduled. I did discuss with him that acute worsening or new fever could impact timing of surgery, but with severe ICA stenosis and vascular input anesthesia team would likely reassess on the day of surgery. Discussed with anesthesiologist Erma Simmonds, DO.    Anesthesia team to evaluate on the day of surgery.     VS: BP (!) 171/86   Pulse 67   Temp 36.6 C   Resp 18   Ht 5' 10 (1.778 m)   Wt 80.8 kg   SpO2 99%   BMI 25.55 kg/m  BP Readings from Last 3 Encounters:  01/08/24 (!) 171/86  01/08/24 112/69  12/20/23 136/74     PROVIDERS: Clarice Nottingham, MD is PCP  Anner Lenis, MD is cardiologist  Serene Gaile Dixons, MD is vascular surgeon   LABS: Labs reviewed: Acceptable for surgery. (all labs ordered are listed, but only abnormal results are displayed)  Labs Reviewed  COMPREHENSIVE METABOLIC PANEL WITH GFR - Abnormal; Notable for the following components:      Result Value   Chloride 97 (*)    Glucose, Bld 114 (*)    AST 42 (*)    All other components within normal limits  URINALYSIS, ROUTINE W REFLEX MICROSCOPIC - Abnormal; Notable for the following components:   Hgb urine dipstick SMALL (*)    All other components within normal limits  SURGICAL PCR SCREEN  CBC  PROTIME-INR  APTT  TYPE AND SCREEN     IMAGES: CTA Neck 11/04/2023: IMPRESSION: 1. Positive High-grade ICA origin/bulb stenoses in the neck with BILATERAL Radiographic-STRING-Signs. Both ICAs patent but with asymmetric diminished LICA distal caliber and enhancement. And evidence of left > right ICA siphon atherosclerosis also. 2. Advanced atherosclerosis also of both  Vertebral Arteries which remain patent. Severe stenoses of the dominant left vertebral artery origin, and the nondominant distal right vertebral V4. Moderate additional distal left artery V4 segment. - S/p left CEA 11/20/2023.    EKG: 11/15/2023:  Normal sinus rhythm Incomplete right bundle branch block Stable lateral TWI Confirmed by Vannie Mora ([id number]) on 11/15/2023 9:20:13 AM     CV: US  Carotid 10/24/2023: Summary:  - Right Carotid: Velocities in the right ICA are consistent with a 80-99% stenosis.  - Left Carotid: Velocities in the left ICA are consistent with a 80-99% stenosis.  - Vertebrals: Bilateral vertebral arteries demonstrate antegrade flow.  - Subclavians: Normal flow hemodynamics were seen in bilateral subclavian arteries.  - S/p left CEA 11/20/2023.   Nuclear stress test 11/14/2020:   The study is normal. The study is low risk.   No ST deviation was noted.   LV perfusion is normal. There is no evidence of ischemia. There is no evidence of infarction.   Left ventricular function is normal. Nuclear stress EF: 67 %. The left ventricular ejection fraction is hyperdynamic (>65%). End diastolic cavity size is normal. Paradoxical septal motion secondary to post-op status.   Prior study available for comparison from  06/28/2015. No changes on perfusion images compared to prior study.   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   Cancer (HCC)    skin cancers-squamous cell /  facial    COVID    x2   Dyslipidemia - low HDL    Lipids monitored by Dr. Clarice.   GERD (gastroesophageal reflux disease)    History of multiple allergies    History of vertigo    Hypertension    Labile   Low testosterone     Recently stopped replacement therapy.   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   Urinary incontinence, urge    Wears glasses     Past Surgical History:  Procedure Laterality Date   arthroscopy of knees bilat      CARDIAC CATHETERIZATION  08/18/2010   Terminal left main 70%, ostial LAD 90%, sequential 60-70% lesion in the proximal LAD, mild circumflex lesions, but large OM1 and OM2. RCA had moderate irregularities and a subtotal proximal PDA   CARDIOVASCULAR STRESS TEST  02/06/2011   EKG negative for ischemia. No significant wall motion abnormalities noted.   CATARACT EXTRACTION, BILATERAL     CHOLECYSTECTOMY N/A 07/29/2015   Procedure: LAPAROSCOPIC CHOLECYSTECTOMY WITH INTRAOPERATIVE CHOLANGIOGRAM;  Surgeon: Krystal Russell, MD;  Location: [facility];  Service: General;  Laterality: N/A;   COLONOSCOPY     CORONARY ARTERY BYPASS GRAFT  08/29/2010   x4. LIMA to LAD, SVG to diag, SVG to OM, SVG to PDA. Rt thigh vein harvest   ENDARTERECTOMY Left 11/20/2023   Procedure: ENDARTERECTOMY, CAROTID with BIOPATCH;  Surgeon: Serene Gaile ORN, MD;  Location: [facility];  Service: Vascular;  Laterality: Left;   HAND SURGERY Right 10/2023   carpal tunnel and surgery for finger numbness   LYMPHADENECTOMY Bilateral 10/13/2018   Procedure: LYMPHADENECTOMY, PELVIC;  Surgeon: Renda Glance, MD;  Location: [facility];  Service: Urology;  Laterality: Bilateral;   MOHS SURGERY     Face and Neck Left eyelid   NM MYOVIEW  LTD  11/2020   No Ischemia or Infarction. EF 65-70%. LOW RISK. NORMAL   PRE CABG DOPPLER  08/25/2010    No significant extracranial carotid artery stenosis. ECA stenosis noted bilaterally - ICA/CCA ratio id 0.9-rt and 0.74-lft   ROBOT ASSISTED LAPAROSCOPIC RADICAL PROSTATECTOMY N/A 10/13/2018   Procedure: XI ROBOTIC ASSISTED LAPAROSCOPIC RADICAL PROSTATECTOMY LEVEL 2;  Surgeon: Renda Glance, MD;  Location: [facility];  Service: Urology;  Laterality: N/A;   SHOULDER ARTHROSCOPY Left 08/09/2023   rotator cuff repair- D Beane   SURGERY SCROTAL / TESTICULAR     Benign nodule   TONSILLECTOMY     TRANSTHORACIC ECHOCARDIOGRAM  07/27/2010   EF >55%, mild concentric LVH, stage 1 diastolic dysfunction  MEDICATIONS:  ACETAMINOPHEN  PO   aspirin  EC 81 MG tablet   Calcium  Carb-Cholecalciferol (CALCIUM -VITAMIN D PO)   Coenzyme Q10 100 MG capsule   diazepam  (VALIUM ) 2 MG tablet   diphenhydrAMINE  (BENADRYL ) 25 MG tablet   docusate sodium  (COLACE) 100 MG capsule   doxycycline (VIBRAMYCIN) 100 MG capsule   famotidine  (PEPCID ) 20 MG tablet   ipratropium (ATROVENT) 0.06 % nasal spray   losartan  (COZAAR ) 50 MG tablet   metoprolol  tartrate (LOPRESSOR ) 25 MG tablet   Nutritional Supplements (COLD AND FLU PO)   pantoprazole  (PROTONIX ) 40 MG tablet   Pediatric Multiple Vitamins (MULTIVITAMIN CHILDRENS) CHEW   Polyethyl Glycol-Propyl Glycol (LUBRICATING EYE DROPS OP)   pseudoephedrine (SUDAFED) 30 MG tablet   rosuvastatin  (CRESTOR ) 10 MG tablet   sildenafil (REVATIO) 20 MG tablet   traMADol  (ULTRAM ) 50 MG tablet   No current facility-administered medications for this encounter.   He is not currently taking diazepam , doxycycline, Sudafed.   Isaiah Ruder, PA-C Surgical Short Stay/Anesthesiology [facility] Phone [phone] [facility] Phone [phone] 01/13/2024 6:06 PM

## 2024-01-13 NOTE — Anesthesia Preprocedure Evaluation (Signed)
"                                    Anesthesia Evaluation  Patient identified by MRN, date of birth, ID band Patient awake    Reviewed: Allergy & Precautions, H&P , NPO status , Patient's Chart, lab work & pertinent test results  History of Anesthesia Complications Negative for: history of anesthetic complications  Airway Mallampati: II  TM Distance: >3 FB Neck ROM: Full    Dental no notable dental hx.    Pulmonary neg pulmonary ROS   Pulmonary exam normal breath sounds clear to auscultation       Cardiovascular hypertension, + CAD, + CABG and + Peripheral Vascular Disease  Normal cardiovascular exam Rhythm:Regular Rate:Normal  B/l Carotid stenosis   Neuro/Psych neg Seizures negative neurological ROS  negative psych ROS   GI/Hepatic Neg liver ROS,GERD  ,,  Endo/Other  negative endocrine ROS    Renal/GU negative Renal ROS   Prostate cancer    Musculoskeletal negative musculoskeletal ROS (+)    Abdominal   Peds negative pediatric ROS (+)  Hematology negative hematology ROS (+)   Anesthesia Other Findings   Reproductive/Obstetrics negative OB ROS                              Anesthesia Physical Anesthesia Plan  ASA: 3  Anesthesia Plan: General   Post-op Pain Management: Ofirmev  IV (intra-op)*   Induction: Intravenous  PONV Risk Score and Plan: 2 and Ondansetron , Dexamethasone  and Treatment may vary due to age or medical condition  Airway Management Planned: Oral ETT  Additional Equipment: Arterial line  Intra-op Plan:   Post-operative Plan: Extubation in OR  Informed Consent: I have reviewed the patients History and Physical, chart, labs and discussed the procedure including the risks, benefits and alternatives for the proposed anesthesia with the patient or authorized representative who has indicated his/her understanding and acceptance.     Dental advisory given  Plan Discussed with:  CRNA  Anesthesia Plan Comments: (PAT note written 01/13/2024 by Glayds Insco, PA-C.  )         Anesthesia Quick Evaluation  "

## 2024-01-15 ENCOUNTER — Encounter (HOSPITAL_COMMUNITY)
Admission: RE | Disposition: A | Discharge status: Home / Self Care | Payer: Self-pay | Source: Home / Self Care | Attending: Surgery

## 2024-01-15 ENCOUNTER — Inpatient Hospital Stay (HOSPITAL_COMMUNITY): Payer: Self-pay | Admitting: Vascular Surgery

## 2024-01-15 ENCOUNTER — Encounter (HOSPITAL_COMMUNITY): Payer: Self-pay | Admitting: Surgery

## 2024-01-15 ENCOUNTER — Other Ambulatory Visit: Payer: Self-pay

## 2024-01-15 ENCOUNTER — Inpatient Hospital Stay (HOSPITAL_COMMUNITY)
Admission: RE | Admit: 2024-01-15 | Discharge: 2024-01-17 | DRG: 039 | Disposition: A | Discharge status: Home / Self Care | Attending: Surgery | Admitting: Surgery

## 2024-01-15 ENCOUNTER — Inpatient Hospital Stay (HOSPITAL_COMMUNITY)

## 2024-01-15 DIAGNOSIS — Z8546 Personal history of malignant neoplasm of prostate: Secondary | ICD-10-CM | POA: Diagnosis not present

## 2024-01-15 DIAGNOSIS — E7800 Pure hypercholesterolemia, unspecified: Secondary | ICD-10-CM | POA: Diagnosis present

## 2024-01-15 DIAGNOSIS — I973 Postprocedural hypertension: Secondary | ICD-10-CM | POA: Diagnosis not present

## 2024-01-15 DIAGNOSIS — I6521 Occlusion and stenosis of right carotid artery: Principal | ICD-10-CM | POA: Diagnosis present

## 2024-01-15 DIAGNOSIS — Z7982 Long term (current) use of aspirin: Secondary | ICD-10-CM

## 2024-01-15 DIAGNOSIS — Z881 Allergy status to other antibiotic agents status: Secondary | ICD-10-CM | POA: Diagnosis not present

## 2024-01-15 DIAGNOSIS — Z79899 Other long term (current) drug therapy: Secondary | ICD-10-CM

## 2024-01-15 DIAGNOSIS — I10 Essential (primary) hypertension: Secondary | ICD-10-CM | POA: Diagnosis present

## 2024-01-15 DIAGNOSIS — Z85828 Personal history of other malignant neoplasm of skin: Secondary | ICD-10-CM | POA: Diagnosis not present

## 2024-01-15 DIAGNOSIS — Z951 Presence of aortocoronary bypass graft: Secondary | ICD-10-CM

## 2024-01-15 DIAGNOSIS — I2510 Atherosclerotic heart disease of native coronary artery without angina pectoris: Secondary | ICD-10-CM | POA: Diagnosis present

## 2024-01-15 DIAGNOSIS — K219 Gastro-esophageal reflux disease without esophagitis: Secondary | ICD-10-CM | POA: Diagnosis present

## 2024-01-15 DIAGNOSIS — I739 Peripheral vascular disease, unspecified: Secondary | ICD-10-CM | POA: Diagnosis present

## 2024-01-15 DIAGNOSIS — Z9889 Other specified postprocedural states: Principal | ICD-10-CM

## 2024-01-15 HISTORY — PX: ENDARTERECTOMY: SHX5162

## 2024-01-15 LAB — CBC
HCT: 40.6 % (ref 39.0–52.0)
Hemoglobin: 14.3 g/dL (ref 13.0–17.0)
MCH: 30.2 pg (ref 26.0–34.0)
MCHC: 35.2 g/dL (ref 30.0–36.0)
MCV: 85.7 fL (ref 80.0–100.0)
Platelets: 204 K/uL (ref 150–400)
RBC: 4.74 MIL/uL (ref 4.22–5.81)
RDW: 13.1 % (ref 11.5–15.5)
WBC: 8.6 K/uL (ref 4.0–10.5)
nRBC: 0 % (ref 0.0–0.2)

## 2024-01-15 LAB — CREATININE, SERUM
Creatinine, Ser: 1.19 mg/dL (ref 0.61–1.24)
GFR, Estimated: >60 mL/min

## 2024-01-15 SURGERY — ENDARTERECTOMY, CAROTID
Anesthesia: General | Site: Neck | Laterality: Right

## 2024-01-15 MED ORDER — POTASSIUM CHLORIDE CRYS ER 20 MEQ PO TBCR: 40.0000 meq | EXTENDED_RELEASE_TABLET | Freq: Every day | ORAL | Status: DC | PRN

## 2024-01-15 MED ORDER — PROPOFOL 1000 MG/100ML IV EMUL
INTRAVENOUS | Status: AC
  Filled 2024-01-15: qty 200

## 2024-01-15 MED ORDER — SURGIFLO WITH THROMBIN (HEMOSTATIC MATRIX KIT) OPTIME
TOPICAL | Status: DC | PRN
  Administered 2024-01-15: 1 via TOPICAL

## 2024-01-15 MED ORDER — OXYCODONE HCL 5 MG PO TABS: 5.0000 mg | ORAL_TABLET | Freq: Once | ORAL | Status: DC | PRN

## 2024-01-15 MED ORDER — HEPARIN SODIUM (PORCINE) 5000 UNIT/ML IJ SOLN
5000.0000 [IU] | Freq: Three times a day (TID) | INTRAMUSCULAR | Status: DC
  Administered 2024-01-16 – 2024-01-17 (×4): 5000 [IU] via SUBCUTANEOUS
  Filled 2024-01-15 (×4): qty 1

## 2024-01-15 MED ORDER — HYDRALAZINE HCL 20 MG/ML IJ SOLN
5.0000 mg | INTRAMUSCULAR | Status: DC | PRN
  Administered 2024-01-16: 5 mg via INTRAVENOUS
  Filled 2024-01-15: qty 1

## 2024-01-15 MED ORDER — DEXAMETHASONE SOD PHOSPHATE PF 10 MG/ML IJ SOLN
INTRAMUSCULAR | Status: DC | PRN
  Administered 2024-01-15: 10 mg via INTRAVENOUS

## 2024-01-15 MED ORDER — DIPHENHYDRAMINE HCL 25 MG PO CAPS
ORAL_CAPSULE | ORAL | Status: AC
  Administered 2024-01-15: 25 mg via ORAL
  Filled 2024-01-15: qty 1

## 2024-01-15 MED ORDER — OXYCODONE HCL 5 MG PO TABS: 5.0000 mg | ORAL_TABLET | ORAL | Status: DC | PRN

## 2024-01-15 MED ORDER — 0.9 % SODIUM CHLORIDE (POUR BTL) OPTIME
TOPICAL | Status: DC | PRN
  Administered 2024-01-15: 1000 mL

## 2024-01-15 MED ORDER — FENTANYL CITRATE (PF) 100 MCG/2ML IJ SOLN: 25.0000 ug | INTRAMUSCULAR | Status: DC | PRN

## 2024-01-15 MED ORDER — ACETAMINOPHEN 325 MG PO TABS
325.0000 mg | ORAL_TABLET | ORAL | Status: DC | PRN
  Administered 2024-01-15 – 2024-01-16 (×4): 650 mg via ORAL
  Filled 2024-01-15 (×4): qty 2

## 2024-01-15 MED ORDER — CHLORHEXIDINE GLUCONATE 0.12 % MT SOLN
15.0000 mL | Freq: Once | OROMUCOSAL | Status: AC
  Filled 2024-01-15: qty 15

## 2024-01-15 MED ORDER — CHLORHEXIDINE GLUCONATE 0.12 % MT SOLN
OROMUCOSAL | Status: AC
  Administered 2024-01-15: 15 mL via OROMUCOSAL
  Filled 2024-01-15: qty 15

## 2024-01-15 MED ORDER — ACETAMINOPHEN 10 MG/ML IV SOLN
INTRAVENOUS | Status: DC | PRN
  Administered 2024-01-15: 1000 mg via INTRAVENOUS

## 2024-01-15 MED ORDER — CIPROFLOXACIN IN D5W 400 MG/200ML IV SOLN
400.0000 mg | INTRAVENOUS | Status: AC
  Administered 2024-01-15: 400 mg via INTRAVENOUS
  Filled 2024-01-15: qty 200

## 2024-01-15 MED ORDER — LACTATED RINGERS IV SOLN: INTRAVENOUS | Status: DC

## 2024-01-15 MED ORDER — PROTAMINE SULFATE 10 MG/ML IV SOLN
INTRAVENOUS | Status: DC | PRN
  Administered 2024-01-15: 50 mg via INTRAVENOUS

## 2024-01-15 MED ORDER — ACETAMINOPHEN 10 MG/ML IV SOLN
INTRAVENOUS | Status: AC
  Filled 2024-01-15: qty 100

## 2024-01-15 MED ORDER — LIDOCAINE 2% (20 MG/ML) 5 ML SYRINGE
INTRAMUSCULAR | Status: DC | PRN
  Administered 2024-01-15: 80 mg via INTRAVENOUS

## 2024-01-15 MED ORDER — ORAL CARE MOUTH RINSE: 15.0000 mL | Freq: Once | OROMUCOSAL | Status: AC

## 2024-01-15 MED ORDER — MORPHINE SULFATE (PF) 2 MG/ML IV SOLN: 2.0000 mg | INTRAVENOUS | Status: DC | PRN

## 2024-01-15 MED ORDER — ACETAMINOPHEN 650 MG RE SUPP: 325.0000 mg | RECTAL | Status: DC | PRN

## 2024-01-15 MED ORDER — LOSARTAN POTASSIUM 50 MG PO TABS
50.0000 mg | ORAL_TABLET | Freq: Two times a day (BID) | ORAL | Status: DC
  Administered 2024-01-15 – 2024-01-16 (×2): 50 mg via ORAL
  Filled 2024-01-15 (×2): qty 1

## 2024-01-15 MED ORDER — POLYETHYLENE GLYCOL 3350 17 G PO PACK: 17.0000 g | PACK | Freq: Every day | ORAL | Status: DC | PRN

## 2024-01-15 MED ORDER — HEPARIN 6000 UNIT IRRIGATION SOLUTION
Status: DC | PRN
  Administered 2024-01-15: 1

## 2024-01-15 MED ORDER — PROPOFOL 10 MG/ML IV BOLUS
INTRAVENOUS | Status: DC | PRN
  Administered 2024-01-15: 100 mg via INTRAVENOUS
  Administered 2024-01-15: 30 mg via INTRAVENOUS

## 2024-01-15 MED ORDER — PROPOFOL 10 MG/ML IV BOLUS
INTRAVENOUS | Status: AC
  Filled 2024-01-15: qty 20

## 2024-01-15 MED ORDER — PHENOL 1.4 % MT LIQD: 1.0000 | OROMUCOSAL | Status: DC | PRN

## 2024-01-15 MED ORDER — SODIUM CHLORIDE 0.9 % IV SOLN: INTRAVENOUS | Status: AC

## 2024-01-15 MED ORDER — PHENYLEPHRINE 80 MCG/ML (10ML) SYRINGE FOR IV PUSH (FOR BLOOD PRESSURE SUPPORT)
PREFILLED_SYRINGE | INTRAVENOUS | Status: DC | PRN
  Administered 2024-01-15 (×2): 80 ug via INTRAVENOUS

## 2024-01-15 MED ORDER — PHENYLEPHRINE HCL-NACL 20-0.9 MG/250ML-% IV SOLN
INTRAVENOUS | Status: DC | PRN
  Administered 2024-01-15: 10 ug/min via INTRAVENOUS

## 2024-01-15 MED ORDER — FAMOTIDINE 20 MG PO TABS
20.0000 mg | ORAL_TABLET | Freq: Every day | ORAL | Status: DC | PRN
  Administered 2024-01-16 – 2024-01-17 (×2): 20 mg via ORAL
  Filled 2024-01-15 (×2): qty 1

## 2024-01-15 MED ORDER — LABETALOL HCL 5 MG/ML IV SOLN
10.0000 mg | INTRAVENOUS | Status: AC | PRN
  Administered 2024-01-16 – 2024-01-17 (×4): 10 mg via INTRAVENOUS
  Filled 2024-01-15 (×3): qty 4

## 2024-01-15 MED ORDER — DOCUSATE SODIUM 100 MG PO CAPS
100.0000 mg | ORAL_CAPSULE | Freq: Every day | ORAL | Status: DC
  Filled 2024-01-15: qty 1

## 2024-01-15 MED ORDER — ASPIRIN 81 MG PO TBEC
81.0000 mg | DELAYED_RELEASE_TABLET | Freq: Every day | ORAL | Status: DC
  Administered 2024-01-16 – 2024-01-17 (×2): 81 mg via ORAL
  Filled 2024-01-15 (×2): qty 1

## 2024-01-15 MED ORDER — SODIUM CHLORIDE 0.9 % IV SOLN: INTRAVENOUS | Status: DC

## 2024-01-15 MED ORDER — DIPHENHYDRAMINE HCL 25 MG PO CAPS: 25.0000 mg | ORAL_CAPSULE | Freq: Once | ORAL | Status: AC

## 2024-01-15 MED ORDER — METOPROLOL TARTRATE 5 MG/5ML IV SOLN: 2.5000 mg | INTRAVENOUS | Status: DC | PRN

## 2024-01-15 MED ORDER — DIAZEPAM 2 MG PO TABS
1.0000 mg | ORAL_TABLET | Freq: Every day | ORAL | Status: DC | PRN
  Administered 2024-01-16: 1 mg via ORAL
  Filled 2024-01-15 (×2): qty 1

## 2024-01-15 MED ORDER — ONDANSETRON HCL 4 MG/2ML IJ SOLN
INTRAMUSCULAR | Status: DC | PRN
  Administered 2024-01-15: 4 mg via INTRAVENOUS

## 2024-01-15 MED ORDER — DROPERIDOL 2.5 MG/ML IJ SOLN: 0.6250 mg | Freq: Once | INTRAMUSCULAR | Status: DC | PRN

## 2024-01-15 MED ORDER — FENTANYL CITRATE (PF) 100 MCG/2ML IJ SOLN
INTRAMUSCULAR | Status: AC
  Filled 2024-01-15: qty 2

## 2024-01-15 MED ORDER — OXYCODONE HCL 5 MG/5ML PO SOLN: 5.0000 mg | Freq: Once | ORAL | Status: DC | PRN

## 2024-01-15 MED ORDER — ACETAMINOPHEN 10 MG/ML IV SOLN: 1000.0000 mg | Freq: Once | INTRAVENOUS | Status: DC | PRN

## 2024-01-15 MED ORDER — ROSUVASTATIN CALCIUM 5 MG PO TABS
10.0000 mg | ORAL_TABLET | Freq: Every day | ORAL | Status: DC
  Administered 2024-01-15 – 2024-01-16 (×2): 10 mg via ORAL
  Filled 2024-01-15 (×2): qty 2

## 2024-01-15 MED ORDER — LIDOCAINE HCL (PF) 1 % IJ SOLN
INTRAMUSCULAR | Status: AC
  Filled 2024-01-15: qty 30

## 2024-01-15 MED ORDER — HEPARIN 6000 UNIT IRRIGATION SOLUTION
Status: AC
  Filled 2024-01-15: qty 500

## 2024-01-15 MED ORDER — MIDAZOLAM HCL (PF) 2 MG/2ML IJ SOLN
INTRAMUSCULAR | Status: DC | PRN
  Administered 2024-01-15: 2 mg via INTRAVENOUS

## 2024-01-15 MED ORDER — BISACODYL 5 MG PO TBEC: 5.0000 mg | DELAYED_RELEASE_TABLET | Freq: Every day | ORAL | Status: DC | PRN

## 2024-01-15 MED ORDER — FENTANYL CITRATE (PF) 250 MCG/5ML IJ SOLN
INTRAMUSCULAR | Status: DC | PRN
  Administered 2024-01-15: 100 ug via INTRAVENOUS

## 2024-01-15 MED ORDER — CHLORHEXIDINE GLUCONATE CLOTH 2 % EX PADS
6.0000 | MEDICATED_PAD | Freq: Once | CUTANEOUS | Status: AC
  Administered 2024-01-15: 6 via TOPICAL

## 2024-01-15 MED ORDER — SUGAMMADEX SODIUM 200 MG/2ML IV SOLN
INTRAVENOUS | Status: DC | PRN
  Administered 2024-01-15: 300 mg via INTRAVENOUS

## 2024-01-15 MED ORDER — HEPARIN SODIUM (PORCINE) 1000 UNIT/ML IJ SOLN
INTRAMUSCULAR | Status: DC | PRN
  Administered 2024-01-15: 5000 [IU] via INTRAVENOUS
  Administered 2024-01-15: 8000 [IU] via INTRAVENOUS

## 2024-01-15 MED ORDER — SODIUM CHLORIDE 0.9 % IV SOLN: 500.0000 mL | Freq: Once | INTRAVENOUS | Status: DC | PRN

## 2024-01-15 MED ORDER — ROCURONIUM BROMIDE 10 MG/ML (PF) SYRINGE
PREFILLED_SYRINGE | INTRAVENOUS | Status: DC | PRN
  Administered 2024-01-15: 60 mg via INTRAVENOUS
  Administered 2024-01-15: 10 mg via INTRAVENOUS

## 2024-01-15 MED ORDER — ONDANSETRON HCL 4 MG/2ML IJ SOLN: 4.0000 mg | Freq: Four times a day (QID) | INTRAMUSCULAR | Status: DC | PRN

## 2024-01-15 MED ORDER — MIDAZOLAM HCL 2 MG/2ML IJ SOLN
INTRAMUSCULAR | Status: AC
  Filled 2024-01-15: qty 2

## 2024-01-15 MED ORDER — CHLORHEXIDINE GLUCONATE CLOTH 2 % EX PADS: 6.0000 | MEDICATED_PAD | Freq: Once | CUTANEOUS | Status: DC

## 2024-01-15 SURGICAL SUPPLY — 43 items
BAG COUNTER SPONGE SURGICOUNT (BAG) ×1 IMPLANT
CANISTER SUCTION 3000ML PPV (SUCTIONS) ×1 IMPLANT
CATH ROBINSON RED A/P 18FR (CATHETERS) ×1 IMPLANT
CATH SUCT 10FR WHISTLE TIP (CATHETERS) ×1 IMPLANT
CLIP TI MEDIUM 6 (CLIP) ×1 IMPLANT
CLIP TI WIDE RED SMALL 6 (CLIP) ×1 IMPLANT
COVER PROBE W GEL 5X96 (DRAPES) IMPLANT
DERMABOND ADVANCED .7 DNX12 (GAUZE/BANDAGES/DRESSINGS) ×1 IMPLANT
ELECT CAUTERY BLADE 6.4 (BLADE) IMPLANT
ELECTRODE REM PT RTRN 9FT ADLT (ELECTROSURGICAL) ×1 IMPLANT
EVACUATOR SILICONE 100CC (DRAIN) IMPLANT
GLOVE BIOGEL PI IND STRL 8 (GLOVE) ×1 IMPLANT
GLOVE SURG SS PI 7.5 STRL IVOR (GLOVE) ×1 IMPLANT
GOWN STRL REUS W/ TWL LRG LVL3 (GOWN DISPOSABLE) ×2 IMPLANT
GOWN STRL REUS W/ TWL XL LVL3 (GOWN DISPOSABLE) ×1 IMPLANT
GRAFT VASC PATCH XENOSURE 1X14 (Vascular Products) IMPLANT
HEMOSTAT SNOW SURGICEL 2X4 (HEMOSTASIS) IMPLANT
INSERT FOGARTY SM (MISCELLANEOUS) IMPLANT
KIT BASIN OR (CUSTOM PROCEDURE TRAY) ×1 IMPLANT
KIT SHUNT ARGYLE CAROTID ART 6 (VASCULAR PRODUCTS) IMPLANT
KIT SUCTION CATH SOFT MINI 10F (CATHETERS) IMPLANT
KIT TURNOVER KIT B (KITS) ×1 IMPLANT
LOOP VESSEL MINI RED (MISCELLANEOUS) IMPLANT
NEEDLE HYPO 25GX1X1/2 BEV (NEEDLE) IMPLANT
PACK CAROTID (CUSTOM PROCEDURE TRAY) ×1 IMPLANT
PAD ARMBOARD POSITIONER FOAM (MISCELLANEOUS) ×2 IMPLANT
PENCIL BUTTON HOLSTER BLD 10FT (ELECTRODE) IMPLANT
POSITIONER HEAD DONUT 9IN (MISCELLANEOUS) ×1 IMPLANT
SET WALTER ACTIVATION W/DRAPE (SET/KITS/TRAYS/PACK) IMPLANT
SHUNT CAROTID BYPASS 10 (VASCULAR PRODUCTS) IMPLANT
SHUNT CAROTID BYPASS 12FRX15.5 (VASCULAR PRODUCTS) IMPLANT
SOLN 0.9% NACL POUR BTL 1000ML (IV SOLUTION) ×3 IMPLANT
SOLN STERILE WATER BTL 1000 ML (IV SOLUTION) ×1 IMPLANT
SPONGE INTESTINAL PEANUT (DISPOSABLE) IMPLANT
SURGIFLO W/THROMBIN 8M KIT (HEMOSTASIS) IMPLANT
SUT ETHILON 3 0 PS 1 (SUTURE) IMPLANT
SUT PROLENE 6 0 BV (SUTURE) ×2 IMPLANT
SUT PROLENE 7 0 BV1 MDA (SUTURE) IMPLANT
SUT SILK 3-0 18XBRD TIE 12 (SUTURE) IMPLANT
SUT VIC AB 3-0 SH 27X BRD (SUTURE) ×2 IMPLANT
SUT VIC AB 3-0 X1 27 (SUTURE) ×1 IMPLANT
SYR CONTROL 10ML LL (SYRINGE) IMPLANT
TOWEL GREEN STERILE (TOWEL DISPOSABLE) ×1 IMPLANT

## 2024-01-15 NOTE — Progress Notes (Signed)
 Patient arrived at the unit from [facility] arrival pt is alert and oriented X4,NIH scale 0,rt neck incision level 0,CHG bath given,vitals taken,CCMD notified,pt oriented to the unit

## 2024-01-15 NOTE — Anesthesia Procedure Notes (Signed)
 Arterial Line Insertion Start/End1/07/2024 10:30 AM, 01/15/2024 10:36 AM Performed by: Zelphia Norleen HERO, CRNA, CRNA  Patient location: Pre-op. Preanesthetic checklist: patient identified, IV checked, site marked, risks and benefits discussed, surgical consent, monitors and equipment checked, pre-op evaluation, timeout performed and anesthesia consent Lidocaine  1% used for infiltration Left, radial was placed Catheter size: 20 G Hand hygiene performed  and maximum sterile barriers used   Attempts: 1 Procedure performed using ultrasound to evaluate access site. Ultrasound Notes:relevant anatomy identified, ultrasound used to visualize needle entry, vessel patent under ultrasound and image(s) printed for medical record. Following insertion, dressing applied. Post procedure assessment: normal and unchanged  Patient tolerated the procedure well with no immediate complications.

## 2024-01-15 NOTE — Anesthesia Procedure Notes (Signed)
 Procedure Name: Intubation Date/Time: 01/15/2024 1:06 PM  Performed by: Alen Motto D, CRNAPre-anesthesia Checklist: Patient identified, Emergency Drugs available, Suction available and Patient being monitored Patient Re-evaluated:Patient Re-evaluated prior to induction Oxygen Delivery Method: Circle System Utilized Preoxygenation: Pre-oxygenation with 100% oxygen Induction Type: IV induction Ventilation: Mask ventilation without difficulty Laryngoscope Size: Mac and 4 Grade View: Grade I Tube type: Oral Tube size: 7.5 mm Number of attempts: 1 Airway Equipment and Method: Stylet and Oral airway Placement Confirmation: ETT inserted through vocal cords under direct vision, positive ETCO2 and breath sounds checked- equal and bilateral Secured at: 23 cm Tube secured with: Tape Dental Injury: Teeth and Oropharynx as per pre-operative assessment

## 2024-01-15 NOTE — Transfer of Care (Signed)
 Immediate Anesthesia Transfer of Care Note  Patient: [name]  Procedure(s) Performed: ENDARTERECTOMY with BIIOPATCH, CAROTID (Right: Neck)  Patient Location: PACU  Anesthesia Type:General  Level of Consciousness: awake, alert , and oriented  Airway & Oxygen Therapy: Patient Spontanous Breathing and Patient connected to face mask oxygen  Post-op Assessment: Report given to RN and Post -op Vital signs reviewed and stable  Post vital signs: Reviewed and stable  Last Vitals:  Vitals Value Taken Time  BP 128/70 01/15/24 15:37  Temp    Pulse 64 01/15/24 15:40  Resp 15 01/15/24 15:40  SpO2 98 % 01/15/24 15:40  Vitals shown include unfiled device data.  Last Pain:  Vitals:   01/15/24 1042  TempSrc:   PainSc: 0-No pain         Complications: No notable events documented.

## 2024-01-15 NOTE — Op Note (Signed)
 "   Patient name: [name] MRN: [id number] DOB: [birth year] Sex: male  01/15/2024 Pre-operative Diagnosis: Asymptomatic   right carotid stenosis Post-operative diagnosis:  Same Surgeon:  Malvina New Assistants:  EMERSON Holster, PA Procedure:    right carotid Endarterectomy with bovine pericardial patch angioplasty Anesthesia:  General Blood Loss:  minimal Specimens:  none  Findings:  90 %stenosis; Thrombus:  none  Indications: This is a 79 year old gentleman with bilateral asymptomatic carotid stenosis.  He has recently undergone left carotid endarterectomy and comes in today for the right.  I had him see ENT to make sure his vocal cords were okay.  Procedure:  The patient was identified in the holding area and taken to [facility] OR ROOM 12  The patient was then placed supine on the table.   General endotrachial anesthesia was administered.  The patient was prepped and draped in the usual sterile fashion.  A time out was called and antibiotics were administered.  A PA was necessary to expedite the procedure and assist with technical details.  She helped with exposure by providing suction and retraction.  She helped with the anastomosis by following the suture.  The incision was made along the anterior border of the right sternocleidomastoid muscle.  Cautery was used to dissect through the subcutaneous tissue.  The platysma muscle was divided with cautery.  The internal jugular vein was exposed along its anterior medial border.  The common facial vein was exposed and then divided between 2-0 silk ties and metal clips.  The common carotid artery was then circumferentially exposed and encircled with an umbilical tape.  The vagus nerve was identified and protected.  Next sharp dissection was used to expose the external carotid artery and the superior thyroid artery.  The were encircled with a blue vessel loop and a 2-0 silk tie respectively.  Finally, the internal carotid was carefully dissected free.  An  umbilical tape was placed around the internal carotid artery distal to the diseased segment.  The hypoglossal nerve was visualized throughout and protected.  The patient was given systemic heparinization.  A bovine carotid patch was selected and prepared on the back table.  A 10 french shunt was also prepared.  After blood pressure readings were appropriate and the heparin  had been given time to circulate, the internal carotid artery was occluded with a baby Gregory clamp.  The external and common carotid arteries were then occluded with vascular clamps and the 2-0 tie tightened on the superior thyroid artery.  A #11 blade was used to make an arteriotomy in the common carotid artery.  This was extended with Potts scissors along the anterior and lateral border of the common and internal carotid artery.  Approximately 90% stenosis was identified.  There was no thrombus identified.  The 10 french shunt was not placed, as there was excellent backbleeding.  A kleiner kuntz elevator was used to perform endarterectomy.  An eversion endarterectomy was performed in the external carotid artery.  A good distal endpoint was obtained in the internal carotid artery.  The specimen was removed and sent to pathology.  Heparinized saline was used to irrigate the endarterectomized field.  All potential embolic debris was removed.  Bovine pericardial patch angioplasty was then performed using a running 6-0 Prolene.  The common internal and external carotid arteries were all appropriately flushed. The artery was again irrigated with heparin  saline.  The anastomosis was then secured. The clamp was first released on the external carotid artery followed by the common  carotid artery approximately 30 seconds later, bloodflow was reestablish through the internal carotid artery.  Next, a hand-held  Doppler was used to evaluate the signals in the common, external, and internal  carotid arteries, all of which had appropriate signals. I then  administered  50 mg protamine . The wound was then irrigated.  After hemostasis was achieved, the carotid sheath was reapproximated with 3-0 Vicryl. The  platysma muscle was reapproximated with running 3-0 Vicryl. The skin  was closed with 4-0 Vicryl. Dermabond was placed on the skin. The  patient was then successfully extubated. His neurologic exam was  similar to his preprocedural exam. The patient was then taken to recovery room  in stable condition. There were no complications.     Disposition:  To PACU in stable condition.  Relevant Operative Details: Extensive plaque in the distal common carotid artery extending into the internal carotid for approximately 3 cm.  Tacking sutures were used at the distal endpoint.  A bovine pericardial patch was placed.  No shunt was utilized as there was pulsatile backbleeding  V. Malvina New, M.D., [facility] [place] Office: [phone] Pager:  [phone]  "

## 2024-01-15 NOTE — Interval H&P Note (Signed)
 History and Physical Interval Note:  01/15/2024 12:01 PM  [name]  has presented today for surgery, with the diagnosis of BL CAROTID STENOSIS.  The various methods of treatment have been discussed with the patient and family. After consideration of risks, benefits and other options for treatment, the patient has consented to  Procedures: ENDARTERECTOMY, CAROTID (Right) as a surgical intervention.  The patient's history has been reviewed, patient examined, no change in status, stable for surgery.  I have reviewed the patient's chart and labs.  Questions were answered to the patient's satisfaction.     Malvina New

## 2024-01-16 ENCOUNTER — Encounter (HOSPITAL_COMMUNITY): Payer: Self-pay | Admitting: Surgery

## 2024-01-16 ENCOUNTER — Other Ambulatory Visit (HOSPITAL_COMMUNITY): Payer: Self-pay

## 2024-01-16 DIAGNOSIS — Z48812 Encounter for surgical aftercare following surgery on the circulatory system: Secondary | ICD-10-CM

## 2024-01-16 LAB — CBC
HCT: 43.9 % (ref 39.0–52.0)
Hemoglobin: 15 g/dL (ref 13.0–17.0)
MCH: 29.7 pg (ref 26.0–34.0)
MCHC: 34.2 g/dL (ref 30.0–36.0)
MCV: 86.9 fL (ref 80.0–100.0)
Platelets: 225 K/uL (ref 150–400)
RBC: 5.05 MIL/uL (ref 4.22–5.81)
RDW: 13.3 % (ref 11.5–15.5)
WBC: 10.4 K/uL (ref 4.0–10.5)
nRBC: 0 % (ref 0.0–0.2)

## 2024-01-16 LAB — BASIC METABOLIC PANEL WITH GFR
Anion gap: 12 (ref 5–15)
BUN: 18 mg/dL (ref 8–23)
CO2: 23 mmol/L (ref 22–32)
Calcium: 9.2 mg/dL (ref 8.9–10.3)
Chloride: 102 mmol/L (ref 98–111)
Creatinine, Ser: 1.18 mg/dL (ref 0.61–1.24)
GFR, Estimated: >60 mL/min
Glucose, Bld: 129 mg/dL — ABNORMAL HIGH (ref 70–99)
Potassium: 4.3 mmol/L (ref 3.5–5.1)
Sodium: 137 mmol/L (ref 135–145)

## 2024-01-16 MED ORDER — LOSARTAN POTASSIUM 50 MG PO TABS
75.0000 mg | ORAL_TABLET | Freq: Two times a day (BID) | ORAL | Status: DC
  Administered 2024-01-16 – 2024-01-17 (×2): 75 mg via ORAL
  Filled 2024-01-16 (×2): qty 1

## 2024-01-16 MED ORDER — METOPROLOL TARTRATE 25 MG PO TABS
25.0000 mg | ORAL_TABLET | Freq: Two times a day (BID) | ORAL | Status: DC
  Administered 2024-01-16 – 2024-01-17 (×3): 25 mg via ORAL
  Filled 2024-01-16 (×3): qty 1

## 2024-01-16 MED ORDER — DIPHENHYDRAMINE HCL 50 MG/ML IJ SOLN
25.0000 mg | Freq: Once | INTRAMUSCULAR | Status: AC
  Administered 2024-01-16: 25 mg via INTRAVENOUS
  Filled 2024-01-16 (×2): qty 1

## 2024-01-16 NOTE — Progress Notes (Signed)
 Mobility Specialist Progress Note;   01/16/24 0907  Mobility  Activity Ambulated with assistance  Level of Assistance Contact guard assist, steadying assist  Assistive Device None  Distance Ambulated (ft) 200 ft  Activity Response Tolerated fair  Mobility Referral Yes  Mobility visit 1 Mobility  Mobility Specialist Start Time (ACUTE ONLY) 0907  Mobility Specialist Stop Time (ACUTE ONLY) 0926  Mobility Specialist Time Calculation (min) (ACUTE ONLY) 19 min   Pre-mobility: Sitting BP 167/82 (108) Post-mobility:Sitting BP 215/87 (122) 5-min sitting: BP 191/81 (111)   Pt in chair upon arrival, agreeable to mobility. Required light MinG assistance during ambulation for safety. C/o mild dizziness at [facility], however recovered some w/ mobility. Bps taken before and after ambulation (see above). Pt returned back to chair and left with all needs met. RN notified and present.   Lauraine Erm Mobility Specialist Please contact via SecureChat or [employer] [phone]

## 2024-01-16 NOTE — TOC Initial Note (Signed)
 Transition of Care (TOC) - Initial/Assessment Note  Rayfield Gobble RN, BSN Inpatient Care Management Unit 4E- RN Case Manager See Treatment Team for direct phone #   Patient Details  Name: [name] MRN: [id number] Date of Birth: [birth year]  Transition of Care [facility]) CM/SW Contact:    Gobble Rayfield Hurst, RN Phone Number: 01/16/2024, 4:14 PM  Clinical Narrative:                 Pt s/p CEA, from home w/ wife. CM notified by [facility] liaison that [facility] office made pre-op referral for [facility] needs.   CM in to speak with pt at bedside- discussed referral for [facility] to [facility]. Per pt he states that he will not need HH follow up, wife is a engineer, civil (consulting) and he feels they will be fine with no HH follow up. Declines referral at this time. No DME needs noted. Wife will transport home.   [facility] liaison updated that pt has no HH needs for discharge.   Expected Discharge Plan: Home w Home Health Services Barriers to Discharge: Continued Medical Work up   Patient Goals and CMS Choice Patient states their goals for this hospitalization and ongoing recovery are:: return home   Choice offered to / list presented to : Patient      Expected Discharge Plan and Services   Discharge Planning Services: CM Consult Post Acute Care Choice: Home Health Living arrangements for the past 2 months: Single Family Home                 DME Arranged: N/A DME Agency: NA       HH Arranged: NA HH Agency: [facility] ([facility])        Prior Living Arrangements/Services Living arrangements for the past 2 months: Single Family Home Lives with:: Spouse Patient language and need for interpreter reviewed:: Yes Do you feel safe going back to the place where you live?: Yes      Need for Family Participation in Patient Care: Yes (Comment) Care giver support system in place?: Yes (comment)   Criminal Activity/Legal Involvement Pertinent to Current Situation/Hospitalization: No - Comment as  needed  Activities of Daily Living   ADL Screening (condition at time of admission) Independently performs ADLs?: Yes (appropriate for developmental age) Is the patient deaf or have difficulty hearing?: Yes Does the patient have difficulty seeing, even when wearing glasses/contacts?: No Does the patient have difficulty concentrating, remembering, or making decisions?: No  Permission Sought/Granted                  Emotional Assessment Appearance:: Appears stated age Attitude/Demeanor/Rapport: Engaged Affect (typically observed): Appropriate Orientation: : Oriented to Self, Oriented to Place, Oriented to  Time, Oriented to Situation Alcohol / Substance Use: Not Applicable Psych Involvement: No (comment)  Admission diagnosis:  Bilateral carotid artery stenosis [I65.23] Status post carotid endarterectomy [Z98.890] Carotid artery stenosis, asymptomatic, right [I65.21] Patient Active Problem List   Diagnosis Date Noted   Carotid artery stenosis, asymptomatic, right 01/15/2024   Status post carotid endarterectomy 11/20/2023   Carotid stenosis, asymptomatic, left 11/20/2023   Episode of dizziness 01/11/2022   Prostate cancer (HCC) 10/13/2018   Malignant neoplasm of prostate (HCC) 09/16/2018   Preop cardiovascular exam 09/11/2018   Coronary artery disease involving native coronary artery of native heart without angina pectoris 03/16/2014   Myalgia 03/16/2014   Dyslipidemia (high LDL; low HDL)    Hypertension - labile    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 [place] [address] Phone: [phone] Fax: [phone]  [id number] - [place], [place] - [address] AT [facility] [address]  [place] [address] Phone:  [phone] Fax: [phone]  [facility] [address] [place] [address] Phone: [phone] Fax: [phone]     Social Drivers of Health (SDOH) Social History: SDOH Screenings   Food Insecurity: No Food Insecurity (01/15/2024)  Housing: Low Risk (01/15/2024)  Transportation Needs: No Transportation Needs (01/15/2024)  Utilities: Not At Risk (01/15/2024)  Social Connections: Socially Integrated (01/15/2024)  Tobacco Use: Medium Risk (01/15/2024)   SDOH Interventions:     Readmission Risk Interventions     No data to display

## 2024-01-16 NOTE — Progress Notes (Signed)
"  ° ° °  I have discussed this case with Dr.Brabham. We have been monitoring the patient's blood pressure today on his home medications. Unfortunately the patient continues to have labile Bps with systolic pressures above 180. He has been given several doses of IV labetalol  to keep his systolic pressure below 180.   The patient has dealt with similar issues of post op hypertension after carotid surgery. We will temporarily increase his home losartan  dose from 50mg  BID to 75mg  BID.   If his systolic pressures improve and remain under 180 without any further IV medications, he should be stable for discharge. He may ultimately require another day of monitoring and blood pressure management.   Ahmed Holster, PA-C [facility] 01/16/2024, 1:11PM "

## 2024-01-16 NOTE — Progress Notes (Signed)
" ° ° °  Subjective  - POD # 1, status post right carotid endarterectomy  Overnight, he became hypertensive with blood pressures into the 180s.  He also had left arm swelling and facial redness.  His A-line was removed and he was given blood pressure medication and Benadryl  which helped.  He has voided.   Physical Exam:  Right carotid incision is clean dry and intact without hematoma Neurologically intact   Assessment/Plan:  POD #1  -The patient's blood pressure is better controlled now after getting labetalol  and hydralazine . - Left arm swelling nearly resolved now that A-line and IVs have been removed.  Will continue to keep arm elevated - Facial redness: He did receive Benadryl .  He had red man syndrome from vancomycin  at his last hospitalization.  Hopefully this is transient.  He can continue to get Benadryl  - If he eats and walks this morning, and his blood pressure remains at appropriate levels, I anticipate him being able to be discharged  [name] 01/16/2024 6:57 AM --  Vitals:   01/16/24 0600 01/16/24 0628  BP: (!) 117/54 138/65  Pulse: 77 79  Resp: 14 14  Temp:    SpO2: 96% 96%    Intake/Output Summary (Last 24 hours) at 01/16/2024 0657 Last data filed at 01/16/2024 0548 Gross per 24 hour  Intake 2014.5 ml  Output 1550 ml  Net 464.5 ml     Laboratory CBC    Component Value Date/Time   WBC 10.4 01/16/2024 0357   HGB 15.0 01/16/2024 0357   HCT 43.9 01/16/2024 0357   PLT 225 01/16/2024 0357    BMET    Component Value Date/Time   NA 137 01/16/2024 0357   K 4.3 01/16/2024 0357   CL 102 01/16/2024 0357   CO2 23 01/16/2024 0357   GLUCOSE 129 (H) 01/16/2024 0357   BUN 18 01/16/2024 0357   CREATININE 1.18 01/16/2024 0357   CREATININE 1.27 (H) 03/05/2016 0904   CALCIUM  9.2 01/16/2024 0357   GFRNONAA >60 01/16/2024 0357   GFRAA >60 10/09/2018 1452    COAG Lab Results  Component Value Date   INR 1.0 01/08/2024   INR 1.0 11/15/2023   INR 1.09  07/19/2015   No results found for: PTT  Antibiotics Anti-infectives (From admission, onward)    Start     Dose/Rate Route Frequency Ordered Stop   01/15/24 1016  ciprofloxacin  (CIPRO ) IVPB 400 mg        400 mg 200 mL/hr over 60 Minutes Intravenous 60 min pre-op 01/15/24 1016 01/15/24 1316        V. Malvina Serene CLORE, M.D., [facility] [place] Office: [phone] Pager:  [phone]  "

## 2024-01-16 NOTE — Anesthesia Postprocedure Evaluation (Signed)
"   Anesthesia Post Note  Patient: [name]  Procedure(s) Performed: ENDARTERECTOMY with BIIOPATCH, CAROTID (Right: Neck)     Patient location during evaluation: PACU Anesthesia Type: General Level of consciousness: awake and alert Pain management: pain level controlled Vital Signs Assessment: post-procedure vital signs reviewed and stable Respiratory status: spontaneous breathing, nonlabored ventilation, respiratory function stable and patient connected to nasal cannula oxygen Cardiovascular status: blood pressure returned to baseline and stable Postop Assessment: no apparent nausea or vomiting Anesthetic complications: no   There were no known notable events for this encounter.  Last Vitals:  Vitals:   01/16/24 1000 01/16/24 1030  BP: (!) 190/95 (!) 161/81  Pulse:    Resp: 16 15  Temp:    SpO2:      Last Pain:  Vitals:   01/16/24 0826  TempSrc: Axillary  PainSc:                  Thom JONELLE Peoples      "

## 2024-01-17 ENCOUNTER — Other Ambulatory Visit (HOSPITAL_COMMUNITY): Payer: Self-pay

## 2024-01-17 LAB — BASIC METABOLIC PANEL WITH GFR
Anion gap: 12 (ref 5–15)
BUN: 18 mg/dL (ref 8–23)
CO2: 24 mmol/L (ref 22–32)
Calcium: 8.9 mg/dL (ref 8.9–10.3)
Chloride: 105 mmol/L (ref 98–111)
Creatinine, Ser: 1.07 mg/dL (ref 0.61–1.24)
GFR, Estimated: >60 mL/min
Glucose, Bld: 100 mg/dL — ABNORMAL HIGH (ref 70–99)
Potassium: 4.2 mmol/L (ref 3.5–5.1)
Sodium: 141 mmol/L (ref 135–145)

## 2024-01-17 LAB — POCT ACTIVATED CLOTTING TIME
Activated Clotting Time: 143 s
Activated Clotting Time: 322 s
Activated Clotting Time: 122 s
Activated Clotting Time: 261 s

## 2024-01-17 MED ORDER — OXYCODONE-ACETAMINOPHEN 5-325 MG PO TABS
1.0000 | ORAL_TABLET | Freq: Four times a day (QID) | ORAL | 0 refills | Status: DC | PRN
  Filled 2024-01-17: qty 10, 3d supply, fill #0

## 2024-01-17 MED ORDER — LOSARTAN POTASSIUM 50 MG PO TABS
75.0000 mg | ORAL_TABLET | Freq: Two times a day (BID) | ORAL | 3 refills | Status: DC
  Filled 2024-01-17: qty 180, 60d supply, fill #0

## 2024-01-17 NOTE — Plan of Care (Signed)
 " Problem: Education: Goal: Knowledge of General Education information will improve Description: Including pain rating scale, medication(s)/side effects and non-pharmacologic comfort measures Outcome: Progressing   Problem: Health Behavior/Discharge Planning: Goal: Ability to manage health-related needs will improve Outcome: Progressing   Problem: Clinical Measurements: Goal: Ability to maintain clinical measurements within normal limits will improve Outcome: Progressing Goal: Will remain free from infection Outcome: Progressing Goal: Diagnostic test results will improve Outcome: Progressing Goal: Respiratory complications will improve Outcome: Progressing Goal: Cardiovascular complication will be avoided Outcome: Progressing   Problem: Activity: Goal: Risk for activity intolerance will decrease Outcome: Progressing   Problem: Nutrition: Goal: Adequate nutrition will be maintained Outcome: Progressing   Problem: Coping: Goal: Level of anxiety will decrease Outcome: Progressing   Problem: Elimination: Goal: Will not experience complications related to bowel motility Outcome: Progressing Goal: Will not experience complications related to urinary retention Outcome: Progressing   Problem: Pain Managment: Goal: General experience of comfort will improve and/or be controlled Outcome: Progressing   Problem: Safety: Goal: Ability to remain free from injury will improve Outcome: Progressing   Problem: Skin Integrity: Goal: Risk for impaired skin integrity will decrease Outcome: Progressing   Problem: Education: Goal: Knowledge of the prescribed therapeutic regimen will improve Outcome: Progressing   Problem: Bowel/Gastric: Goal: Gastrointestinal status for postoperative course will improve Outcome: Progressing   Problem: Cardiac: Goal: Ability to maintain an adequate cardiac output Outcome: Progressing Goal: Will show no evidence of cardiac arrhythmias Outcome:  Progressing   Problem: Nutritional: Goal: Will attain and maintain optimal nutritional status Outcome: Progressing   Problem: Neurological: Goal: Will regain or maintain usual level of consciousness Outcome: Progressing   Problem: Clinical Measurements: Goal: Ability to maintain clinical measurements within normal limits Outcome: Progressing Goal: Postoperative complications will be avoided or minimized Outcome: Progressing   Problem: Respiratory: Goal: Will regain and/or maintain adequate ventilation Outcome: Progressing Goal: Respiratory status will improve Outcome: Progressing   Problem: Skin Integrity: Goal: Demonstrates signs of wound healing without infection Outcome: Progressing   Problem: Urinary Elimination: Goal: Will remain free from infection Outcome: Progressing Goal: Ability to achieve and maintain adequate urine output Outcome: Progressing   Problem: Education: Goal: Knowledge of discharge needs will improve Outcome: Progressing   Problem: Clinical Measurements: Goal: Postoperative complications will be avoided or minimized Outcome: Progressing   Problem: Respiratory: Goal: Will achieve and/or maintain a regular respiratory rate, without signs or symptoms of dyspnea Outcome: Progressing   Problem: Skin Integrity: Goal: Demonstration of wound healing without infection will improve Outcome: Progressing   Problem: Education: Goal: Knowledge of discharge needs will improve Outcome: Progressing   Problem: Clinical Measurements: Goal: Postoperative complications will be avoided or minimized Outcome: Progressing   Problem: Respiratory: Goal: Will achieve and/or maintain a regular respiratory rate, without signs or symptoms of dyspnea Outcome: Progressing   Problem: Skin Integrity: Goal: Demonstration of wound healing without infection will improve Outcome: Progressing   Problem: Education: Goal: Knowledge of disease or condition will  improve Outcome: Progressing Goal: Knowledge of secondary prevention will improve (MUST DOCUMENT ALL) Outcome: Progressing Goal: Knowledge of patient specific risk factors will improve (DELETE if not current risk factor) Outcome: Progressing   Problem: Ischemic Stroke/TIA Tissue Perfusion: Goal: Complications of ischemic stroke/TIA will be minimized Outcome: Progressing   Problem: Coping: Goal: Will verbalize positive feelings about self Outcome: Progressing Goal: Will identify appropriate support needs Outcome: Progressing   Problem: Health Behavior/Discharge Planning: Goal: Ability to manage health-related needs will improve Outcome: Progressing Goal: Goals will be collaboratively  established with patient/family Outcome: Progressing   "

## 2024-01-17 NOTE — Progress Notes (Signed)
" ° ° °  Subjective  - POD #2 status post right carotid endarterectomy  No complaints overnight.  He was kept an additional day because he continued to have elevated blood pressures.  We end up increasing his ARB to 75 mg twice daily.  He denies any headaches or neurologic symptoms   Physical Exam:  Right carotid incision is healing nicely without hematoma Neurologically intact       Assessment/Plan:  POD # 2  -Stable from carotid endarterectomy.  No signs of neurologic change.  Continue aspirin  and statin - Hypertension: His pressures were slightly elevated overnight and hovering around 160.  As long as he stays in this range, I anticipate him being able to go home today.  He did have issues with hypotension after his left carotid arterectomy which ultimately resolved.  Wells Sencere Symonette 01/17/2024 7:22 AM --  Vitals:   01/17/24 0500 01/17/24 0600  BP: (!) 149/79 (!) 143/75  Pulse:    Resp: 13 14  Temp:    SpO2: 99%     Intake/Output Summary (Last 24 hours) at 01/17/2024 0722 Last data filed at 01/16/2024 2055 Gross per 24 hour  Intake 1692.64 ml  Output 2200 ml  Net -507.36 ml     Laboratory CBC    Component Value Date/Time   WBC 10.4 01/16/2024 0357   HGB 15.0 01/16/2024 0357   HCT 43.9 01/16/2024 0357   PLT 225 01/16/2024 0357    BMET    Component Value Date/Time   NA 141 01/17/2024 0348   K 4.2 01/17/2024 0348   CL 105 01/17/2024 0348   CO2 24 01/17/2024 0348   GLUCOSE 100 (H) 01/17/2024 0348   BUN 18 01/17/2024 0348   CREATININE 1.07 01/17/2024 0348   CREATININE 1.27 (H) 03/05/2016 0904   CALCIUM  8.9 01/17/2024 0348   GFRNONAA >60 01/17/2024 0348   GFRAA >60 10/09/2018 1452    COAG Lab Results  Component Value Date   INR 1.0 01/08/2024   INR 1.0 11/15/2023   INR 1.09 07/19/2015   No results found for: PTT  Antibiotics Anti-infectives (From admission, onward)    Start     Dose/Rate Route Frequency Ordered Stop   01/15/24 1016  ciprofloxacin   (CIPRO ) IVPB 400 mg        400 mg 200 mL/hr over 60 Minutes Intravenous 60 min pre-op 01/15/24 1016 01/15/24 1316        V. Malvina Serene CLORE, M.D., [facility] [place] Office: [phone] Pager:  [phone]  "

## 2024-01-17 NOTE — Discharge Instructions (Signed)
   [facility]  Discharge Instructions   Carotid Endarterectomy (CEA)  Please refer to the following instructions for your post-procedure care. Your surgeon or physician assistant will discuss any changes with you.  Activity  You are encouraged to walk as much as you can. You can slowly return to normal activities but must avoid strenuous activity and heavy lifting until your doctor tell you it's OK. Avoid activities such as vacuuming or swinging a golf club. You can drive after one week if you are comfortable and you are no longer taking prescription pain medications. It is normal to feel tired for serval weeks after your surgery. It is also normal to have difficulty with sleep habits, eating, and bowel movements after surgery. These will go away with time.  Bathing/Showering  You may shower after you come home. Do not soak in a bathtub, hot tub, or swim until the incision heals completely.  Incision Care  Shower every day. Clean your incision with mild soap and water. Pat the area dry with a clean towel. You do not need a bandage unless otherwise instructed. Do not apply any ointments or creams to your incision. You may have skin glue on your incision. Do not peel it off. It will come off on its own in about one week. Your incision may feel thickened and raised for several weeks after your surgery. This is normal and the skin will soften over time. For Men Only: It's OK to shave around the incision but do not shave the incision itself for 2 weeks. It is common to have numbness under your chin that could last for several months.  Diet  Resume your normal diet. There are no special food restrictions following this procedure. A low fat/low cholesterol diet is recommended for all patients with vascular disease. In order to heal from your surgery, it is CRITICAL to get adequate nutrition. Your body requires vitamins, minerals, and protein. Vegetables are the best  source of vitamins and minerals. Vegetables also provide the perfect balance of protein. Processed food has little nutritional value, so try to avoid this.        Medications  Resume taking all of your medications unless your doctor or physician assistant tells you not to. If your incision is causing pain, you may take over-the- counter pain relievers such as acetaminophen  (Tylenol ). If you were prescribed a stronger pain medication, please be aware these medications can cause nausea and constipation. Prevent nausea by taking the medication with a snack or meal. Avoid constipation by drinking plenty of fluids and eating foods with a high amount of fiber, such as fruits, vegetables, and grains. Do not take Tylenol  if you are taking prescription pain medications.  Follow Up  Our office will schedule a follow up appointment 2-3 weeks following discharge.  Please call us  immediately for any of the following conditions  Increased pain, redness, drainage (pus) from your incision site. Fever of 101 degrees or higher. If you should develop stroke (slurred speech, difficulty swallowing, weakness on one side of your body, loss of vision) you should call 911 and go to the nearest emergency room.  Reduce your risk of vascular disease:  Stop smoking. If you would like help call [facility] at 1-800-QUIT-NOW (986-078-2376) or  at 864-830-8805. Manage your cholesterol Maintain a desired weight Control your diabetes Keep your blood pressure down  If you have any questions, please call the office at [phone].

## 2024-01-17 NOTE — Progress Notes (Signed)
 DISCHARGE NOTE HOME [name] to be discharged Home per MD order. Discussed prescriptions and follow up appointments with the patient. Prescriptions given to patient; medication list explained in detail. Patient verbalized understanding. Reviewed Stroke educations patient verbalized understanding.  Skin clean, dry and intact without evidence of skin break down, no evidence of skin tears noted. IV catheter discontinued intact. Site without signs and symptoms of complications. Dressing and pressure applied. Pt denies pain at the site currently. No complaints noted.  See LDA for surgical in scision  right neck Patient free of other lines, drains, and wounds.   An After Visit Summary (AVS) was printed and given to the patient. Patient escorted via wheelchair, and discharged home via private auto.  [name], RN

## 2024-01-17 NOTE — Discharge Summary (Signed)
 " Discharge Summary     [name] 1945-04-19 79 y.o. male  [id number]  Admission Date: 01/15/2024  Discharge Date: 01/17/24  Physician: Dr. Serene  Admission Diagnosis: Bilateral carotid artery stenosis [I65.23] Status post carotid endarterectomy [Z98.890] Carotid artery stenosis, asymptomatic, right [I65.21]  Discharge Day services:   See progress note 01/17/2024  Hospital Course:  [name] is a 79 year old male who was brought in as an outpatient and underwent right carotid endarterectomy by Dr. Serene on 01/15/2024 due to asymptomatic high-grade right ICA stenosis.  He tolerated the procedure well and was admitted to the hospital postoperatively.  He experienced some hypertension postoperatively.  This was treated with IV labetalol  and hydralazine .  We also increased dosage of losartan  which will be reflected on his after visit summary.  He was kept an additional night due to hypertension.  Postoperative day #2 systolic blood pressure ranged from 140-160.  He was without any neurological symptoms and ambulating the halls well.  He will discharge home and follow-up in the office in 2 to 3 weeks for an incision check.  Throughout his hospital stay the right carotid incision was well-appearing without hematoma.  He was discharged home in stable condition.   Recent Labs    01/16/24 0357 01/17/24 0348  NA 137 141  K 4.3 4.2  CL 102 105  CO2 23 24  GLUCOSE 129* 100*  BUN 18 18  CALCIUM  9.2 8.9   Recent Labs    01/15/24 1900 01/16/24 0357  WBC 8.6 10.4  HGB 14.3 15.0  HCT 40.6 43.9  PLT 204 225   No results for input(s): INR in the last 72 hours.     Discharge Diagnosis:  Bilateral carotid artery stenosis [I65.23] Status post carotid endarterectomy [Z98.890] Carotid artery stenosis, asymptomatic, right [I65.21]  Secondary Diagnosis: Patient Active Problem List   Diagnosis Date Noted   Carotid artery stenosis, asymptomatic, right 01/15/2024   Status  post carotid endarterectomy 11/20/2023   Carotid stenosis, asymptomatic, left 11/20/2023   Episode of dizziness 01/11/2022   Prostate cancer (HCC) 10/13/2018   Malignant neoplasm of prostate (HCC) 09/16/2018   Preop cardiovascular exam 09/11/2018   Coronary artery disease involving native coronary artery of native heart without angina pectoris 03/16/2014   Myalgia 03/16/2014   Dyslipidemia (high LDL; low HDL)    Hypertension - labile    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   CAD in native artery; Left Main and LAD and occluded RPDA; referred for CABG 07/22/2010   Past Medical History:  Diagnosis Date   Acquired trigger finger of both middle fingers    Acquired trigger finger of both ring fingers    Arthritis    CAD in native artery 08/2010   Cardiac CATH for Abn EKG- GXT portio of Myoview : Diffuse market Inferior ST depressions & STE in V1 and V2; NO scintigraphic evidence of ischemia  => CATH (MV CAD): 70% d LM, 90% ost LAD & 60-70% pLAD; Minimal LCx & p-d RCA but 99% ostrPDA ( BALANCED ISCHEMIA) => CABGx4 (LIMA-LAD, SVG-DiaG, SVG-OM, SVG-rPDA; Myoivew 11/2020 - EF 65-70%, No Ischemia/Infarction.   Cancer (HCC)    skin cancers-squamous cell / facial    COVID    x2   Dyslipidemia - low HDL    Lipids monitored by Dr. Clarice.   GERD (gastroesophageal reflux disease)    History of multiple allergies    History of vertigo    Hypertension    Labile   Low testosterone   Recently stopped replacement therapy.   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   Urinary incontinence, urge    Wears glasses     Allergies as of 01/17/2024       Reactions   Cephalosporins Palpitations   Hydrochlorothiazide -triamterene Palpitations   Cephalexin Dermatitis   Statins Other (See Comments)    elevated muscle enzymes Lipitor   Vancomycin      Red Man Syndrome and shaking   Vioxx [rofecoxib] Swelling   Facial swelling   Zetia  [ezetimibe ]    Increased muscle enzymes; GI upset   Lisinopril Cough   Sulfa Antibiotics Rash        Medication List     STOP taking these medications    doxycycline 100 MG capsule Commonly known as: VIBRAMYCIN   traMADol  50 MG tablet Commonly known as: ULTRAM        TAKE these medications    ACETAMINOPHEN  PO Take 650 mg by mouth daily as needed for moderate pain (pain score 4-6) or headache. Take 2  Daily as needed   aspirin  EC 81 MG tablet Take 81 mg by mouth daily. Swallow whole.   CALCIUM -VITAMIN D PO Take 1 tablet by mouth every Monday, Wednesday, and Friday.   Coenzyme Q10 100 MG capsule Take 100 mg by mouth daily.   COLD AND FLU PO Take 15 mLs by mouth at bedtime as needed. Tylenol  Cold and Flu   diazepam  2 MG tablet Commonly known as: VALIUM  Take 1 mg by mouth daily as needed for anxiety.   diphenhydrAMINE  25 MG tablet Commonly known as: BENADRYL  Take 25 mg by mouth daily as needed for allergies.   docusate sodium  100 MG capsule Commonly known as: COLACE Take 100 mg by mouth daily as needed for mild constipation or moderate constipation.   famotidine  20 MG tablet Commonly known as: PEPCID  Take 20 mg by mouth daily as needed for heartburn or indigestion (takes when the pantoprazole  is not taken).   ipratropium 0.06 % nasal spray Commonly known as: ATROVENT Place 2 sprays into both nostrils 3 (three) times daily as needed.   losartan  50 MG tablet Commonly known as: COZAAR  Take 1.5 tablets (75 mg total) by mouth in the morning and at bedtime. What changed: how much to take   LUBRICATING EYE DROPS OP Place 1 drop into both eyes daily as needed (dry eyes).   metoprolol  tartrate 25 MG tablet Commonly known as: LOPRESSOR  TAKE 1 TABLET TWICE A DAY   Multivitamin Childrens Chew Chew 1 tablet by mouth daily.   oxyCODONE -acetaminophen  5-325 MG tablet Commonly  known as: Percocet Take 1 tablet by mouth every 6 (six) hours as needed for severe pain (pain score 7-10).   pantoprazole  40 MG tablet Commonly known as: PROTONIX  Take 40 mg by mouth every other day.   pseudoephedrine 30 MG tablet Commonly known as: SUDAFED Take 30 mg by mouth every 4 (four) hours as needed for congestion.   rosuvastatin  10 MG tablet Commonly known as: CRESTOR  Take 10 mg by mouth at bedtime.   sildenafil 20 MG tablet Commonly known as: REVATIO Take 20 mg by mouth daily as needed.         Discharge Instructions:   [facility] Discharge Instructions Carotid Endarterectomy (CEA)  Please refer to the following instructions for your post-procedure care. Your surgeon or physician assistant will discuss any changes with you.  Activity  You are encouraged to walk as much as you can. You can slowly return to normal activities but must avoid strenuous activity and heavy lifting until your doctor tell you it's OK. Avoid activities such as vacuuming or swinging a golf club. You can drive after one week if you are comfortable and you are no longer taking prescription pain medications. It is normal to feel tired for serval weeks after your surgery. It is also normal to have difficulty with sleep habits, eating, and bowel movements after surgery. These will go away with time.  Bathing/Showering  You may shower after you come home. Do not soak in a bathtub, hot tub, or swim until the incision heals completely.  Incision Care  Shower every day. Clean your incision with mild soap and water. Pat the area dry with a clean towel. You do not need a bandage unless otherwise instructed. Do not apply any ointments or creams to your incision. You may have skin glue on your incision. Do not peel it off. It will come off on its own in about one week. Your incision may feel thickened and raised for several weeks after your surgery. This is normal and the  skin will soften over time. For Men Only: It's OK to shave around the incision but do not shave the incision itself for 2 weeks. It is common to have numbness under your chin that could last for several months.  Diet  Resume your normal diet. There are no special food restrictions following this procedure. A low fat/low cholesterol diet is recommended for all patients with vascular disease. In order to heal from your surgery, it is CRITICAL to get adequate nutrition. Your body requires vitamins, minerals, and protein. Vegetables are the best source of vitamins and minerals. Vegetables also provide the perfect balance of protein. Processed food has little nutritional value, so try to avoid this.  Medications  Resume taking all of your medications unless your doctor or physician assistant tells you not to.  If your incision is causing pain, you may take over-the- counter pain relievers such as acetaminophen  (Tylenol ). If you were prescribed a stronger pain medication, please be aware these medications can cause nausea and constipation.  Prevent nausea by taking the medication with a snack or meal. Avoid constipation by drinking plenty of fluids and eating foods with a high amount of fiber, such as fruits, vegetables, and grains. Do not take Tylenol  if you are taking prescription pain medications.  Follow Up  Our office will schedule a follow up appointment 2-3 weeks following discharge.  Please call us  immediately for any of the following conditions  Increased pain, redness, drainage (pus) from your incision site. Fever of 101 degrees or higher. If you should develop stroke (slurred speech, difficulty swallowing, weakness on one side of your body, loss of vision) you should call 911 and go to the nearest emergency room.  Reduce your risk of vascular disease:  Stop smoking. If you would like help call [facility] at 1-800-QUIT-NOW [phone]) 317-9750) or [place] at [phone]. Manage your  cholesterol Maintain a desired weight Control your diabetes Keep your blood pressure down  If you have any questions, please call the office at [phone].   Disposition: home  Patient's condition: is Good  Follow up: 1. [facility] in 2-3 weeks.   Donnice Sender, PA-C [facility] [phone]   ---  For [facility] Registry use ---   Modified Rankin score at D/C (0-6): 0  IV medication needed for:  1. Hypertension: Yes 2. Hypotension: No  Post-op Complications: No  1. Post-op CVA or TIA: No  If yes: Event classification (right eye, left eye, right cortical, left cortical, verterobasilar, other):   If yes: Timing of event (intra-op, <6 hrs post-op, >=6 hrs post-op, unknown):   2. CN injury: No  If yes: CN  injuried   3. Myocardial infarction: No  If yes: Dx by (EKG or clinical, Troponin):   4.  CHF: No  5.  Dysrhythmia (new): No  6. Wound infection: No  7. Reperfusion symptoms: No  8. Return to OR: No  If yes: return to OR for (bleeding, neurologic, other CEA incision, other):   Discharge medications: Statin use:  No ASA use:  Yes   Beta blocker use:  Yes ACE-Inhibitor use:  No  ARB use:  Yes CCB use: No P2Y12 Antagonist use: No, [ ]  Plavix, [ ]  Plasugrel, [ ]  Ticlopinine, [ ]  Ticagrelor, [ ]  Other, [ ]  No for medical reason, [ ]  Non-compliant, [ ]  Not-indicated Anti-coagulant use:  No, [ ]  Warfarin, [ ]  Rivaroxaban, [ ]  Dabigatran,    "

## 2024-02-03 ENCOUNTER — Other Ambulatory Visit: Payer: Self-pay | Admitting: Cardiology

## 2024-02-07 ENCOUNTER — Encounter: Payer: Self-pay | Admitting: Cardiology

## 2024-02-07 ENCOUNTER — Telehealth: Payer: Self-pay | Admitting: Cardiology

## 2024-02-07 MED ORDER — AMLODIPINE BESYLATE 2.5 MG PO TABS: ORAL_TABLET | ORAL | 0 refills | Status: DC

## 2024-02-07 NOTE — Telephone Encounter (Signed)
 Called and spoke to pt and wife. Saw Dr. Anner on 12/20/23, BP meds were changed to Losartan  75 mg BID and Lopressor  25 mg BID. Pt had Carotid Endarterectomy on 01/15/2024.   BP's have been elevated, since around 01/27/2024.   Recent BP's: 174/94 198/106 183/102  Today BP is 204/111, Recheck about 20 min later was 216/118.   Patient feels back of neck and face is flushed/hot. Also ears are ringing. At times he has had headaches. No Chest Pains, no other symptoms. They plan to see Dr. Serene for post op visit on 02/10/24 (weather pending).   Pt took Valium  1 mg today at 10:30 am. They want to know what medications can be taken to help to lower BP.

## 2024-02-07 NOTE — Telephone Encounter (Signed)
 Patient's spouse sent message by [name] and would like a call back please advise   Dr Anner,  This is [name] in a dilemma with [name]'s blood pressure!  3 weeks post second carotid artery surgery.  You increased his blood pressure dosage of losartan  to 75 mg in the morning with the 25 mg metoprolol  . He was taking the meds at 9:00 a.m. and 9:00 p.m. .  Was doing pretty good stabilized. Ranges from 146 / 77,  163 / 80, 140 / 68, 134 / 75, 129 / 67, around January 27th...at night it seemed go higher 156/76,  1/28 a.m. 161/79 and p.m. 174 / 91. He woke up the following morning with blood pressure not reading on the monitor took his medicine at 6:30 a.m. and blood pressure then was 141 /71 at 9:30 a.m. that p.m. was 174/91. Last night was 198/ 106 at 6:30 p.m.  January 30th 6:30 a.m. blood pressure not reading on monitor. Took his blood pressure medicine and at 7:30 blood pressure 183 / 102. Please advise what to do for his blood pressure. I told him to take a Valium .  Thank you for your help!  Isaac

## 2024-02-07 NOTE — Telephone Encounter (Signed)
 Called and spoke to pt. Reviewed IN DETAIL how to take the med prescribed by Dr. Okey (DOD). Wife verbalized understanding. Also, pt took a second dose of Valium  and his BP is improved, now is 190/109.   Amlodipine  RX sent to [facility] in [place]:  If SBP (top number) is greater than 160, give half of pill (1.25 mg), re check BP in 1-2 hours and if continues to be elevated, take the other half (only take up to 2.5 mg daily).   Called this pharmacy to verify receipt; med will be available for pick up today, per Pharmacy Associate.

## 2024-02-07 NOTE — Telephone Encounter (Signed)
 Reviewed with nurse He has taken valium  and BP improved  I would Rx amlodipine  2.5 mg   Can take 1/2   ALos have available for high BP Keep on other meds

## 2024-02-08 ENCOUNTER — Telehealth: Payer: Self-pay | Admitting: Cardiology

## 2024-02-08 NOTE — Telephone Encounter (Signed)
 It is quite possible that this change in blood pressures is related to his carotid procedure that was done earlier this month.  His baroreceptors are probably changed.  I would say lets keep him on 5 mg amlodipine  daily and consider adding HCTZ.  Probably need to get him in to be seen next week.   He will need prescription for the amlodipine  called in 5 mg daily but given to take additional 5 mg for systolic pressure greater than 180.  Alm Clay, MD

## 2024-02-08 NOTE — Telephone Encounter (Signed)
 Patient's wife called the answering service this morning concerned that patient's blood pressure has been very elevated for the past couple of days.  Yesterday they called office with elevated BP.  BP elevated to 183/102 yesterday when they called.  After talking with office staff, BP had further elevated to 216/118.  Ears were ringing but patient had no chest pain or other symptoms.  He was started on amlodipine .  Initially instructed to take 1.25 mg for systolic BP greater than 160.  If BP remained elevated, patient can take an additional 1.25 mg  Per wife, patient was sent in 5 mg tablets.  Yesterday evening, patient took 1/2 tablet (2.5 mg).  BP remained elevated so he took the other half totaling 5 mg amlodipine  total.  This morning, BP elevated 196/118.  He took his morning losartan  75 mg and metoprolol  to tartrate 25 mg.  BP improved slightly to 189/107.  Patient took a Valium , BP actually increased to 213/111.  Again, patient has some ringing in his ears but denies any chest pain, vision changes, headaches, weakness, facial drooping.  Instructed patient to take amlodipine  5 mg now.  Instructed him to rest for the next hour or so and repeat blood pressure.  If systolic blood pressure higher than 180, instructed them to give me a callback.  Instructed them that if patient develops any symptoms such as chest pain, vision changes, headaches, shortness of breath, weakness, slurred speech, facial drooping they need to be seen in the ED immediately.  They voiced understanding and agreement  Routing message to Dr. Anner as an RICK, and [facility] asking them to arrange appointment this week in clinic

## 2024-02-10 ENCOUNTER — Other Ambulatory Visit: Payer: Self-pay | Admitting: Internal Medicine

## 2024-02-10 ENCOUNTER — Encounter

## 2024-02-10 ENCOUNTER — Ambulatory Visit (INDEPENDENT_AMBULATORY_CARE_PROVIDER_SITE_OTHER): Admitting: Cardiology

## 2024-02-10 ENCOUNTER — Ambulatory Visit: Admitting: Surgery

## 2024-02-10 ENCOUNTER — Encounter: Payer: Self-pay | Admitting: Cardiology

## 2024-02-10 ENCOUNTER — Telehealth: Payer: Self-pay | Admitting: Cardiology

## 2024-02-10 ENCOUNTER — Encounter: Payer: Self-pay | Admitting: Surgery

## 2024-02-10 VITALS — BP 147/78 | HR 71 | Temp 97.9°F | Ht 70.0 in | Wt 178.0 lb

## 2024-02-10 VITALS — BP 164/80 | HR 70 | Ht 70.0 in | Wt 177.4 lb

## 2024-02-10 DIAGNOSIS — I6522 Occlusion and stenosis of left carotid artery: Secondary | ICD-10-CM

## 2024-02-10 DIAGNOSIS — I10 Essential (primary) hypertension: Secondary | ICD-10-CM

## 2024-02-10 DIAGNOSIS — E785 Hyperlipidemia, unspecified: Secondary | ICD-10-CM | POA: Diagnosis not present

## 2024-02-10 DIAGNOSIS — I6523 Occlusion and stenosis of bilateral carotid arteries: Secondary | ICD-10-CM

## 2024-02-10 DIAGNOSIS — I2510 Atherosclerotic heart disease of native coronary artery without angina pectoris: Secondary | ICD-10-CM | POA: Diagnosis not present

## 2024-02-10 MED ORDER — AMLODIPINE BESYLATE 10 MG PO TABS: 10.0000 mg | ORAL_TABLET | Freq: Every day | ORAL | 3 refills | Status: AC

## 2024-02-10 MED ORDER — LOSARTAN POTASSIUM 50 MG PO TABS: 50.0000 mg | ORAL_TABLET | Freq: Two times a day (BID) | ORAL | 3 refills | Status: AC

## 2024-02-10 NOTE — Progress Notes (Signed)
 "                                   Patient name: [name] MRN: [id number] DOB: 28-Mar-1945 Sex: male  REASON FOR VISIT:    Postop  HISTORY OF PRESENT ILLNESS:   [name] is a 79 y.o. male who is status post right carotid endarterectomy for asymptomatic stenosis on 01/15/2024.  Intraoperative findings included a 90% stenosis.  He stated extradited to hospital because of elevated blood pressures.  His ARB was increased.  He has been having issues with significantly elevated blood pressure since his hospitalization.  Systolic pressures are frequently over or approaching 200.  He saw cardiology today who made medication changes.  He has status post left carotid endarterectomy for asymptomatic stenosis on 11/20/2023.  He suffers from coronary disease, status post CABG in 2012. He is medically managed for hypertension with an ARB. He is on a statin for hypercholesterolemia, however he does not tolerate high-dose therapy. He is a non-smoker. He has a history of prostate cancer   CURRENT MEDICATIONS:    Current Outpatient Medications  Medication Sig Dispense Refill   ACETAMINOPHEN  PO Take 650 mg by mouth daily as needed for moderate pain (pain score 4-6) or headache. Take 2  Daily as needed     aspirin  EC 81 MG tablet Take 81 mg by mouth daily. Swallow whole.     Calcium  Carb-Cholecalciferol (CALCIUM -VITAMIN D PO) Take 1 tablet by mouth every Monday, Wednesday, and Friday.     Coenzyme Q10 100 MG capsule Take 100 mg by mouth daily.     diazepam  (VALIUM ) 2 MG tablet Take 1 mg by mouth daily as needed for anxiety.     diphenhydrAMINE  (BENADRYL ) 25 MG tablet Take 25 mg by mouth daily as needed for allergies.      docusate sodium  (COLACE) 100 MG capsule Take 100 mg by mouth daily as needed for mild constipation or moderate constipation.     ipratropium (ATROVENT) 0.06 % nasal spray Place 2 sprays into both nostrils 3 (three) times daily as needed.     metoprolol  tartrate (LOPRESSOR ) 25 MG  tablet TAKE 1 TABLET TWICE A DAY 180 tablet 3   Nutritional Supplements (COLD AND FLU PO) Take 15 mLs by mouth at bedtime as needed. Tylenol  Cold and Flu     pantoprazole  (PROTONIX ) 40 MG tablet Take 40 mg by mouth every other day.     Pediatric Multiple Vitamins (MULTIVITAMIN CHILDRENS) CHEW Chew 1 tablet by mouth daily.     Polyethyl Glycol-Propyl Glycol (LUBRICATING EYE DROPS OP) Place 1 drop into both eyes daily as needed (dry eyes).     pseudoephedrine (SUDAFED) 30 MG tablet Take 30 mg by mouth every 4 (four) hours as needed for congestion.     rosuvastatin  (CRESTOR ) 10 MG tablet Take 10 mg by mouth at bedtime.     sildenafil (REVATIO) 20 MG tablet Take 20 mg by mouth daily as needed.      amLODipine  (NORVASC ) 10 MG tablet Take 1 tablet (10 mg total) by mouth daily. 90 tablet 3   losartan  (COZAAR ) 50 MG tablet Take 1 tablet (50 mg total) by mouth in the morning and at bedtime. 180 tablet 3   traMADol  (ULTRAM ) 50 MG tablet Take 50 mg by mouth every 6 (six) hours as needed.     No current facility-administered medications for this visit.  REVIEW OF SYSTEMS:   [X]  denotes positive finding, [ ]  denotes negative finding Cardiac  Comments:  Chest pain or chest pressure:    Shortness of breath upon exertion:    Short of breath when lying flat:    Irregular heart rhythm:    Constitutional    Fever or chills:      PHYSICAL EXAM:   Vitals:   02/10/24 1414 02/10/24 1416  BP: (!) 142/83 (!) 147/78  Pulse: 71   Temp: 97.9 F (36.6 C)   SpO2: 98%   Weight: 178 lb (80.7 kg)   Height: 5' 10 (1.778 m)     GENERAL: The patient is a well-nourished male, in no acute distress. The vital signs are documented above. CARDIOVASCULAR: There is a regular rate and rhythm. PULMONARY: Non-labored respirations Carotid incision is healing appropriately.  No hematoma.  No hoarseness  STUDIES:      MEDICAL ISSUES:   Carotid: Patient has undergone successful bilateral carotid arterectomy  without neurologic compromise.  He will follow-up with surveillance ultrasound in 9 months  Hypertension: He has had significant issues with high blood pressure since his surgery.  This is being managed by cardiology.  He is scheduled for a renal ultrasound next week.  Malvina Serene CLORE, MD, FACS [facility] [phone] Pager [phone]     "

## 2024-02-10 NOTE — Patient Instructions (Signed)
 Medication Instructions:   CHANGE LOSARTAN  TO 50 MG TWICE DAILY  START AMLODIPINE  10 MG ONCE DAILY  *If you need a refill on your cardiac medications before your next appointment, please call your pharmacy*  Testing/Procedures:  Your physician has requested that you have a renal artery duplex. During this test, an ultrasound is used to evaluate blood flow to the kidneys. Allow one hour for this exam. Do not eat after midnight the day before and avoid carbonated beverages. Take your medications as you usually do. [address]  Follow-Up: At [facility], you and your health needs are our priority.  As part of our continuing mission to provide you with exceptional heart care, our providers are all part of one team.  This team includes your primary Cardiologist (physician) and Advanced Practice Providers or APPs (Physician Assistants and Nurse Practitioners) who all work together to provide you with the care you need, when you need it.  Your next appointment:    4 week(s) WITH PHARMACIST IN THE HYPERTENSION CLINIC  3 MONTHS WITH DR ANNER

## 2024-02-10 NOTE — Telephone Encounter (Signed)
 I think we added amlodipine  with a plan to increase that dose up to 10 if necessary and then eventually losartan  to 100.

## 2024-02-19 ENCOUNTER — Other Ambulatory Visit

## 2024-02-19 ENCOUNTER — Ambulatory Visit (HOSPITAL_COMMUNITY)

## 2024-03-24 ENCOUNTER — Ambulatory Visit: Admitting: Pharmacist

## 2024-03-25 ENCOUNTER — Other Ambulatory Visit

## 2024-05-12 ENCOUNTER — Ambulatory Visit: Admitting: Cardiology
